# Patient Record
Sex: Male | Born: 1960 | Race: Black or African American | Hispanic: No | Smoking: Former smoker
Health system: Southern US, Community
[De-identification: ages and names within clinical notes are randomized; demographics above are authoritative.]

## PROBLEM LIST (undated history)

## (undated) DIAGNOSIS — I10 Essential (primary) hypertension: Secondary | ICD-10-CM

## (undated) DIAGNOSIS — I82409 Acute embolism and thrombosis of unspecified deep veins of unspecified lower extremity: Secondary | ICD-10-CM

## (undated) DIAGNOSIS — N400 Enlarged prostate without lower urinary tract symptoms: Secondary | ICD-10-CM

## (undated) DIAGNOSIS — E785 Hyperlipidemia, unspecified: Secondary | ICD-10-CM

## (undated) DIAGNOSIS — I2699 Other pulmonary embolism without acute cor pulmonale: Secondary | ICD-10-CM

## (undated) DIAGNOSIS — Z9119 Patient's noncompliance with other medical treatment and regimen: Secondary | ICD-10-CM

## (undated) DIAGNOSIS — S43006A Unspecified dislocation of unspecified shoulder joint, initial encounter: Secondary | ICD-10-CM

## (undated) DIAGNOSIS — K4090 Unilateral inguinal hernia, without obstruction or gangrene, not specified as recurrent: Secondary | ICD-10-CM

## (undated) DIAGNOSIS — E119 Type 2 diabetes mellitus without complications: Secondary | ICD-10-CM

## (undated) HISTORY — PX: OTHER SURGICAL HISTORY: SHX169

## (undated) HISTORY — DX: Essential (primary) hypertension: I10

## (undated) HISTORY — DX: Unspecified dislocation of unspecified shoulder joint, initial encounter: S43.006A

## (undated) HISTORY — DX: Hyperlipidemia, unspecified: E78.5

## (undated) HISTORY — DX: Patient's noncompliance with other medical treatment and regimen: Z91.19

## (undated) HISTORY — DX: Acute embolism and thrombosis of unspecified deep veins of unspecified lower extremity: I82.409

## (undated) HISTORY — DX: Type 2 diabetes mellitus without complications: E11.9

---

## 2001-05-03 ENCOUNTER — Encounter: Payer: Self-pay | Admitting: Emergency Medicine

## 2001-05-03 ENCOUNTER — Emergency Department (HOSPITAL_COMMUNITY): Admission: EM | Admit: 2001-05-03 | Discharge: 2001-05-04 | Payer: Self-pay | Admitting: Emergency Medicine

## 2002-01-04 ENCOUNTER — Emergency Department (HOSPITAL_COMMUNITY): Admission: EM | Admit: 2002-01-04 | Discharge: 2002-01-04 | Payer: Self-pay | Admitting: Emergency Medicine

## 2003-12-11 ENCOUNTER — Inpatient Hospital Stay (HOSPITAL_COMMUNITY): Admission: EM | Admit: 2003-12-11 | Discharge: 2003-12-17 | Payer: Self-pay | Admitting: Emergency Medicine

## 2004-01-07 ENCOUNTER — Ambulatory Visit (HOSPITAL_COMMUNITY): Admission: RE | Admit: 2004-01-07 | Discharge: 2004-01-07 | Payer: Self-pay | Admitting: Family Medicine

## 2004-02-05 ENCOUNTER — Emergency Department (HOSPITAL_COMMUNITY): Admission: EM | Admit: 2004-02-05 | Discharge: 2004-02-05 | Payer: Self-pay | Admitting: Emergency Medicine

## 2004-02-27 ENCOUNTER — Emergency Department (HOSPITAL_COMMUNITY): Admission: EM | Admit: 2004-02-27 | Discharge: 2004-02-27 | Payer: Self-pay | Admitting: Emergency Medicine

## 2004-07-05 ENCOUNTER — Emergency Department (HOSPITAL_COMMUNITY): Admission: EM | Admit: 2004-07-05 | Discharge: 2004-07-06 | Payer: Self-pay | Admitting: Emergency Medicine

## 2004-07-06 ENCOUNTER — Inpatient Hospital Stay (HOSPITAL_COMMUNITY): Admission: AD | Admit: 2004-07-06 | Discharge: 2004-07-11 | Payer: Self-pay | Admitting: Surgery

## 2004-07-13 ENCOUNTER — Encounter: Admission: RE | Admit: 2004-07-13 | Discharge: 2004-07-13 | Payer: Self-pay | Admitting: Oncology

## 2004-07-13 ENCOUNTER — Encounter (HOSPITAL_COMMUNITY): Admission: RE | Admit: 2004-07-13 | Discharge: 2004-08-12 | Payer: Self-pay | Admitting: Oncology

## 2004-07-24 ENCOUNTER — Ambulatory Visit (HOSPITAL_COMMUNITY): Payer: Self-pay | Admitting: Oncology

## 2004-08-25 ENCOUNTER — Encounter: Admission: RE | Admit: 2004-08-25 | Discharge: 2004-08-25 | Payer: Self-pay | Admitting: Oncology

## 2004-08-25 ENCOUNTER — Encounter (HOSPITAL_COMMUNITY): Admission: RE | Admit: 2004-08-25 | Discharge: 2004-09-24 | Payer: Self-pay | Admitting: Oncology

## 2004-09-18 ENCOUNTER — Ambulatory Visit (HOSPITAL_COMMUNITY): Payer: Self-pay | Admitting: Oncology

## 2004-10-16 ENCOUNTER — Encounter: Admission: RE | Admit: 2004-10-16 | Discharge: 2004-10-16 | Payer: Self-pay | Admitting: Oncology

## 2004-10-16 ENCOUNTER — Encounter (HOSPITAL_COMMUNITY): Admission: RE | Admit: 2004-10-16 | Discharge: 2004-11-15 | Payer: Self-pay | Admitting: Oncology

## 2004-11-05 ENCOUNTER — Ambulatory Visit (HOSPITAL_COMMUNITY): Payer: Self-pay | Admitting: Oncology

## 2004-12-05 ENCOUNTER — Emergency Department (HOSPITAL_COMMUNITY): Admission: RE | Admit: 2004-12-05 | Discharge: 2004-12-05 | Payer: Self-pay | Admitting: Family Medicine

## 2005-01-04 ENCOUNTER — Encounter (HOSPITAL_COMMUNITY): Admission: RE | Admit: 2005-01-04 | Discharge: 2005-02-03 | Payer: Self-pay | Admitting: Oncology

## 2005-01-04 ENCOUNTER — Ambulatory Visit (HOSPITAL_COMMUNITY): Payer: Self-pay | Admitting: Oncology

## 2005-01-04 ENCOUNTER — Encounter: Admission: RE | Admit: 2005-01-04 | Discharge: 2005-01-04 | Payer: Self-pay | Admitting: Oncology

## 2005-02-15 ENCOUNTER — Encounter (HOSPITAL_COMMUNITY): Admission: RE | Admit: 2005-02-15 | Discharge: 2005-03-17 | Payer: Self-pay | Admitting: Oncology

## 2005-02-15 ENCOUNTER — Encounter: Admission: RE | Admit: 2005-02-15 | Discharge: 2005-02-15 | Payer: Self-pay | Admitting: Oncology

## 2005-04-02 ENCOUNTER — Encounter (HOSPITAL_COMMUNITY): Admission: RE | Admit: 2005-04-02 | Discharge: 2005-05-02 | Payer: Self-pay | Admitting: Oncology

## 2005-04-02 ENCOUNTER — Encounter: Admission: RE | Admit: 2005-04-02 | Discharge: 2005-04-02 | Payer: Self-pay | Admitting: Oncology

## 2005-04-02 ENCOUNTER — Ambulatory Visit (HOSPITAL_COMMUNITY): Payer: Self-pay | Admitting: Oncology

## 2005-05-03 ENCOUNTER — Encounter (HOSPITAL_COMMUNITY): Admission: RE | Admit: 2005-05-03 | Discharge: 2005-06-02 | Payer: Self-pay | Admitting: Oncology

## 2005-05-03 ENCOUNTER — Encounter: Admission: RE | Admit: 2005-05-03 | Discharge: 2005-05-03 | Payer: Self-pay | Admitting: Oncology

## 2005-05-31 ENCOUNTER — Ambulatory Visit (HOSPITAL_COMMUNITY): Payer: Self-pay | Admitting: Oncology

## 2005-06-14 ENCOUNTER — Encounter (HOSPITAL_COMMUNITY): Admission: RE | Admit: 2005-06-14 | Discharge: 2005-07-14 | Payer: Self-pay | Admitting: Oncology

## 2005-06-14 ENCOUNTER — Encounter: Admission: RE | Admit: 2005-06-14 | Discharge: 2005-06-14 | Payer: Self-pay | Admitting: Oncology

## 2005-09-09 ENCOUNTER — Emergency Department (HOSPITAL_COMMUNITY): Admission: EM | Admit: 2005-09-09 | Discharge: 2005-09-09 | Payer: Self-pay | Admitting: Emergency Medicine

## 2006-07-11 ENCOUNTER — Observation Stay (HOSPITAL_COMMUNITY): Admission: EM | Admit: 2006-07-11 | Discharge: 2006-07-13 | Payer: Self-pay | Admitting: Emergency Medicine

## 2006-07-12 ENCOUNTER — Ambulatory Visit: Payer: Self-pay | Admitting: Oncology

## 2006-07-13 ENCOUNTER — Encounter (HOSPITAL_COMMUNITY): Admission: RE | Admit: 2006-07-13 | Discharge: 2006-08-12 | Payer: Self-pay | Admitting: Oncology

## 2006-07-13 ENCOUNTER — Encounter: Admission: RE | Admit: 2006-07-13 | Discharge: 2006-07-13 | Payer: Self-pay | Admitting: Oncology

## 2006-07-18 ENCOUNTER — Ambulatory Visit (HOSPITAL_COMMUNITY): Payer: Self-pay | Admitting: Oncology

## 2006-08-16 ENCOUNTER — Encounter (HOSPITAL_COMMUNITY): Admission: RE | Admit: 2006-08-16 | Discharge: 2006-09-12 | Payer: Self-pay | Admitting: Oncology

## 2006-09-20 ENCOUNTER — Encounter (HOSPITAL_COMMUNITY): Admission: RE | Admit: 2006-09-20 | Discharge: 2006-10-20 | Payer: Self-pay | Admitting: Oncology

## 2006-09-20 ENCOUNTER — Ambulatory Visit (HOSPITAL_COMMUNITY): Payer: Self-pay | Admitting: Oncology

## 2007-01-16 ENCOUNTER — Encounter (HOSPITAL_COMMUNITY): Admission: RE | Admit: 2007-01-16 | Discharge: 2007-02-15 | Payer: Self-pay | Admitting: Oncology

## 2007-01-16 ENCOUNTER — Ambulatory Visit (HOSPITAL_COMMUNITY): Payer: Self-pay | Admitting: Oncology

## 2007-03-14 ENCOUNTER — Encounter (HOSPITAL_COMMUNITY): Admission: RE | Admit: 2007-03-14 | Discharge: 2007-04-13 | Payer: Self-pay | Admitting: Oncology

## 2007-04-14 ENCOUNTER — Encounter: Admission: RE | Admit: 2007-04-14 | Discharge: 2007-05-14 | Payer: Self-pay | Admitting: Oncology

## 2007-04-28 ENCOUNTER — Ambulatory Visit (HOSPITAL_COMMUNITY): Payer: Self-pay | Admitting: Oncology

## 2007-05-17 ENCOUNTER — Encounter (HOSPITAL_COMMUNITY): Admission: RE | Admit: 2007-05-17 | Discharge: 2007-06-13 | Payer: Self-pay | Admitting: Oncology

## 2007-06-23 ENCOUNTER — Ambulatory Visit (HOSPITAL_COMMUNITY): Payer: Self-pay | Admitting: Oncology

## 2007-06-23 ENCOUNTER — Encounter (HOSPITAL_COMMUNITY): Admission: RE | Admit: 2007-06-23 | Discharge: 2007-07-23 | Payer: Self-pay | Admitting: Oncology

## 2007-08-12 ENCOUNTER — Emergency Department (HOSPITAL_COMMUNITY): Admission: EM | Admit: 2007-08-12 | Discharge: 2007-08-12 | Payer: Self-pay | Admitting: Emergency Medicine

## 2007-08-14 ENCOUNTER — Ambulatory Visit (HOSPITAL_COMMUNITY): Payer: Self-pay | Admitting: Oncology

## 2007-08-14 ENCOUNTER — Ambulatory Visit (HOSPITAL_COMMUNITY): Admission: RE | Admit: 2007-08-14 | Discharge: 2007-08-14 | Payer: Self-pay | Admitting: Family Medicine

## 2007-08-14 ENCOUNTER — Encounter (HOSPITAL_COMMUNITY): Admission: RE | Admit: 2007-08-14 | Discharge: 2007-09-13 | Payer: Self-pay | Admitting: Oncology

## 2007-09-18 ENCOUNTER — Encounter (HOSPITAL_COMMUNITY): Admission: RE | Admit: 2007-09-18 | Discharge: 2007-10-18 | Payer: Self-pay | Admitting: Oncology

## 2007-10-03 ENCOUNTER — Ambulatory Visit (HOSPITAL_COMMUNITY): Payer: Self-pay | Admitting: Oncology

## 2007-10-31 ENCOUNTER — Encounter (HOSPITAL_COMMUNITY): Admission: RE | Admit: 2007-10-31 | Discharge: 2007-11-30 | Payer: Self-pay | Admitting: Oncology

## 2007-11-28 ENCOUNTER — Ambulatory Visit (HOSPITAL_COMMUNITY): Payer: Self-pay | Admitting: Oncology

## 2007-12-05 ENCOUNTER — Encounter (HOSPITAL_COMMUNITY): Admission: RE | Admit: 2007-12-05 | Discharge: 2008-01-04 | Payer: Self-pay | Admitting: Oncology

## 2008-01-10 ENCOUNTER — Encounter (HOSPITAL_COMMUNITY): Admission: RE | Admit: 2008-01-10 | Discharge: 2008-02-09 | Payer: Self-pay | Admitting: Oncology

## 2008-01-24 ENCOUNTER — Ambulatory Visit (HOSPITAL_COMMUNITY): Payer: Self-pay | Admitting: Oncology

## 2008-02-21 ENCOUNTER — Encounter (HOSPITAL_COMMUNITY): Admission: RE | Admit: 2008-02-21 | Discharge: 2008-03-22 | Payer: Self-pay | Admitting: Oncology

## 2008-03-13 ENCOUNTER — Emergency Department (HOSPITAL_COMMUNITY): Admission: EM | Admit: 2008-03-13 | Discharge: 2008-03-13 | Payer: Self-pay | Admitting: Emergency Medicine

## 2008-04-03 ENCOUNTER — Encounter (HOSPITAL_COMMUNITY): Admission: RE | Admit: 2008-04-03 | Discharge: 2008-05-03 | Payer: Self-pay | Admitting: Oncology

## 2008-04-03 ENCOUNTER — Ambulatory Visit (HOSPITAL_COMMUNITY): Payer: Self-pay | Admitting: Oncology

## 2008-05-09 ENCOUNTER — Encounter (HOSPITAL_COMMUNITY): Admission: RE | Admit: 2008-05-09 | Discharge: 2008-06-08 | Payer: Self-pay | Admitting: Oncology

## 2009-02-15 ENCOUNTER — Emergency Department (HOSPITAL_COMMUNITY): Admission: EM | Admit: 2009-02-15 | Discharge: 2009-02-15 | Payer: Self-pay | Admitting: Emergency Medicine

## 2009-04-15 ENCOUNTER — Ambulatory Visit (HOSPITAL_COMMUNITY): Payer: Self-pay | Admitting: Oncology

## 2009-04-15 ENCOUNTER — Encounter (HOSPITAL_COMMUNITY): Admission: RE | Admit: 2009-04-15 | Discharge: 2009-05-15 | Payer: Self-pay | Admitting: Oncology

## 2009-07-08 ENCOUNTER — Ambulatory Visit (HOSPITAL_COMMUNITY): Payer: Self-pay | Admitting: Oncology

## 2009-07-08 ENCOUNTER — Encounter (HOSPITAL_COMMUNITY): Admission: RE | Admit: 2009-07-08 | Discharge: 2009-08-07 | Payer: Self-pay | Admitting: Oncology

## 2009-08-22 ENCOUNTER — Ambulatory Visit (HOSPITAL_COMMUNITY): Payer: Self-pay | Admitting: Oncology

## 2009-08-22 ENCOUNTER — Encounter (HOSPITAL_COMMUNITY): Admission: RE | Admit: 2009-08-22 | Discharge: 2009-09-10 | Payer: Self-pay | Admitting: Oncology

## 2009-09-14 IMAGING — CR DG ABDOMEN 1V
2 series · 2 of 2 positions shown · non-contrast
Comparison: CT abdomen and pelvis 07/12/06.

CLINICAL DATA: Gross hematuria. 
 ABDOMEN - 1 VIEW:

[view not recorded (1 of 2)]
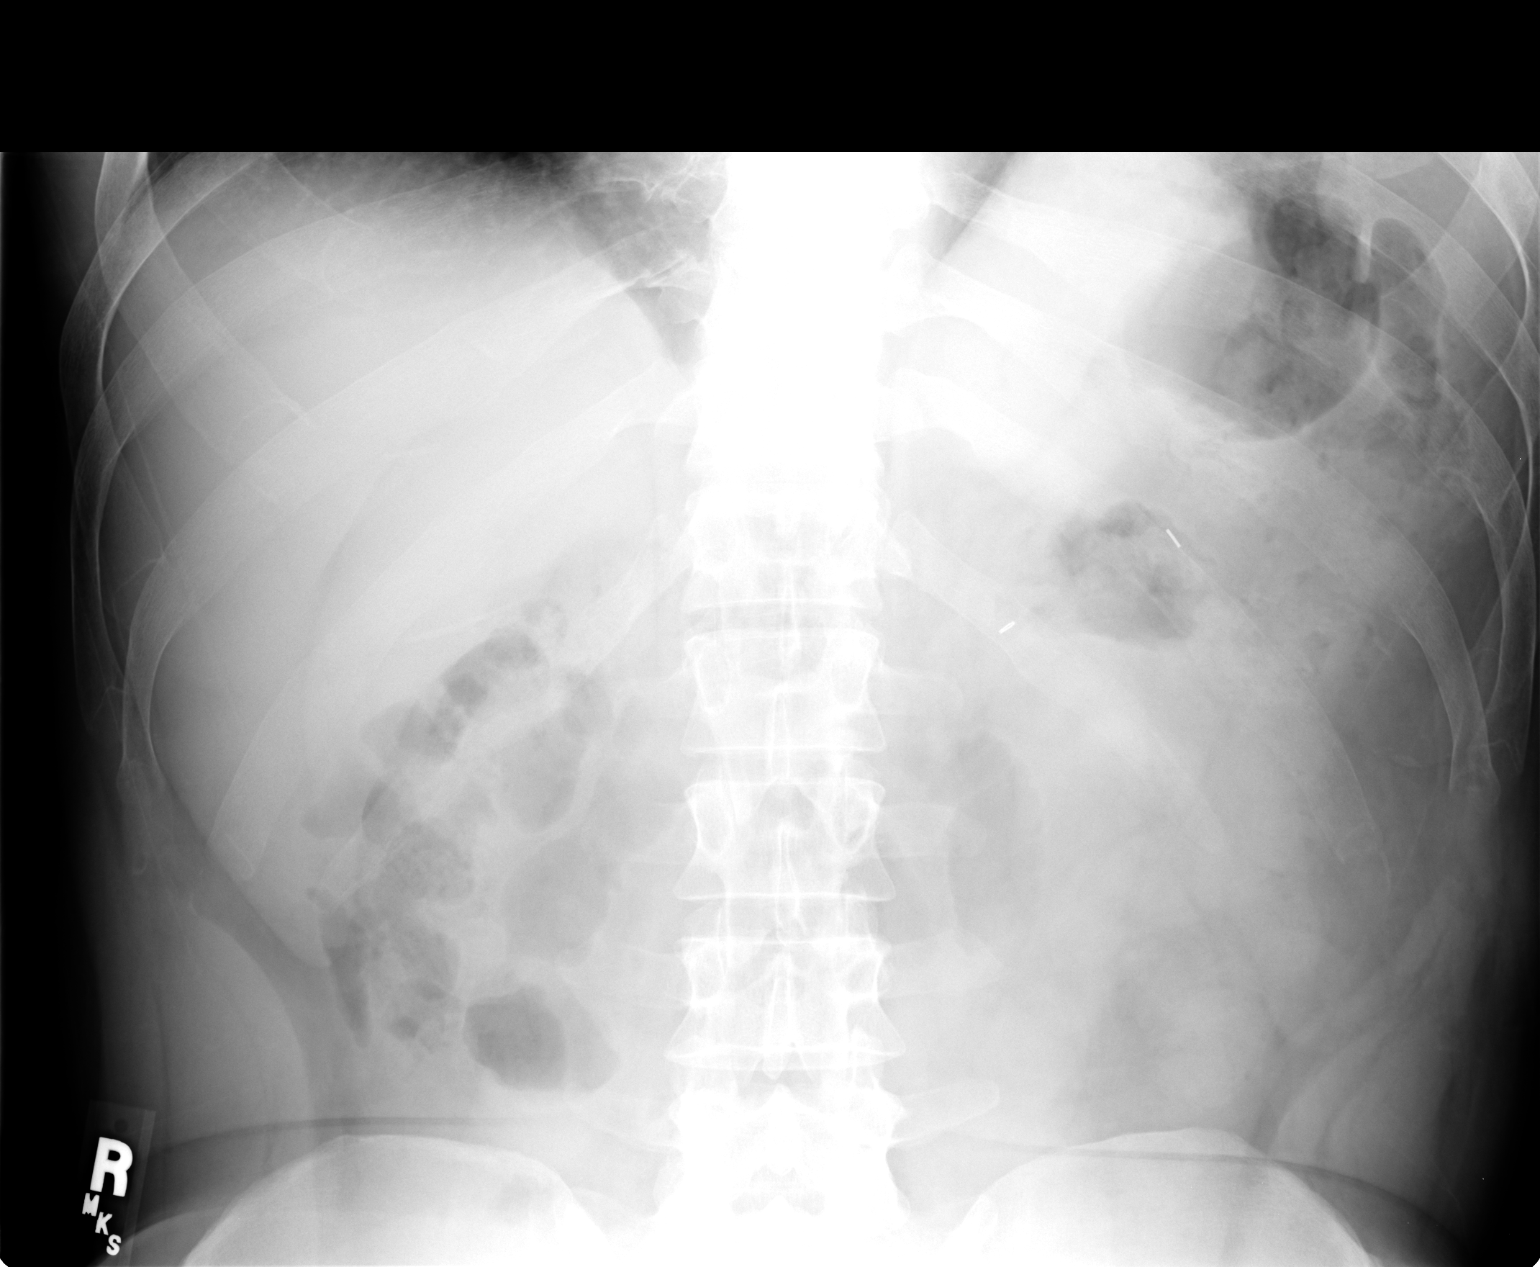

[view not recorded (2 of 2)]
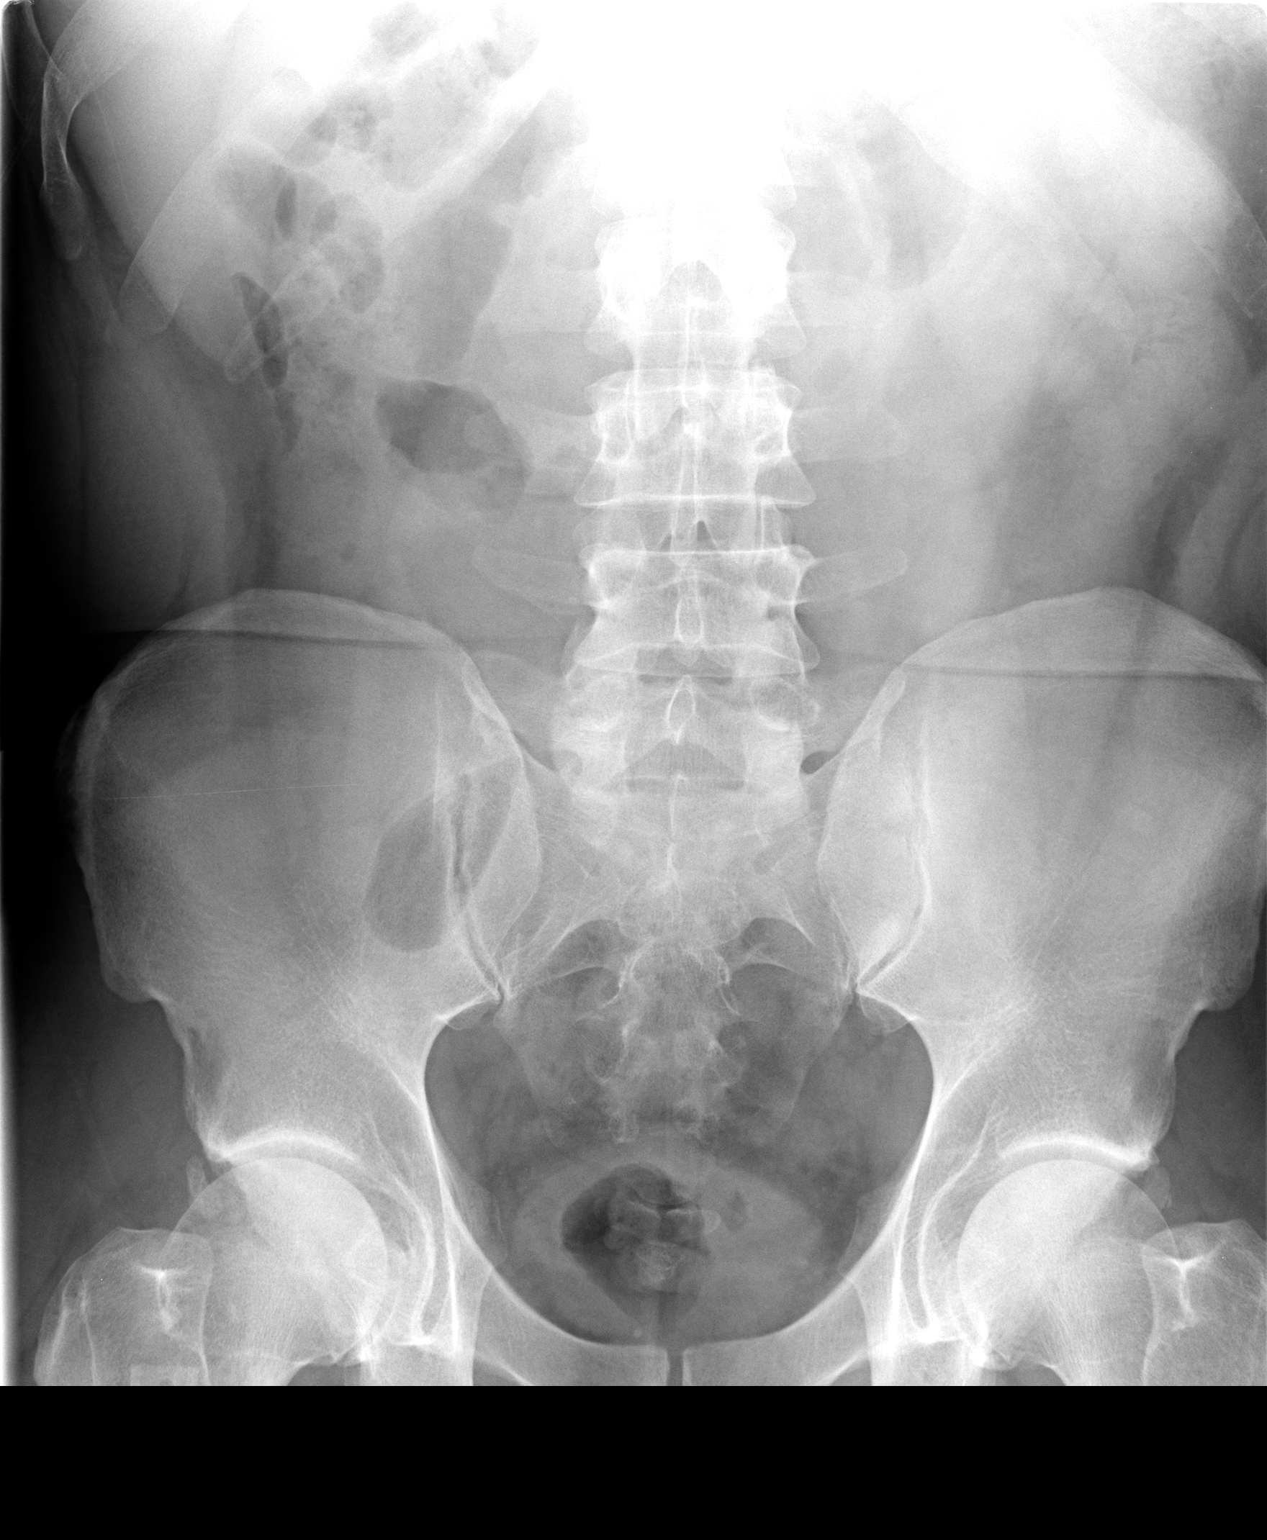

[2 of 2 positions shown; findings below may reference images not displayed]

FINDINGS: There are no radiopaque calculi projecting over the right renal outline or expected course of the right ureter. The left kidney is absent.
IMPRESSION: No radiopaque calculi.

## 2009-09-19 ENCOUNTER — Encounter (HOSPITAL_COMMUNITY): Admission: RE | Admit: 2009-09-19 | Discharge: 2009-10-19 | Payer: Self-pay | Admitting: Oncology

## 2009-10-13 ENCOUNTER — Ambulatory Visit (HOSPITAL_COMMUNITY): Payer: Self-pay | Admitting: Oncology

## 2009-10-24 ENCOUNTER — Encounter (HOSPITAL_COMMUNITY): Admission: RE | Admit: 2009-10-24 | Discharge: 2009-11-23 | Payer: Self-pay | Admitting: Oncology

## 2009-11-17 ENCOUNTER — Emergency Department (HOSPITAL_COMMUNITY): Admission: EM | Admit: 2009-11-17 | Discharge: 2009-11-17 | Payer: Self-pay | Admitting: Emergency Medicine

## 2010-08-13 ENCOUNTER — Encounter (HOSPITAL_COMMUNITY)
Admission: RE | Admit: 2010-08-13 | Discharge: 2010-09-12 | Payer: Self-pay | Source: Home / Self Care | Attending: Oncology | Admitting: Oncology

## 2010-08-13 ENCOUNTER — Ambulatory Visit (HOSPITAL_COMMUNITY): Payer: Self-pay | Admitting: Oncology

## 2010-09-10 ENCOUNTER — Ambulatory Visit (HOSPITAL_COMMUNITY): Payer: Self-pay | Admitting: Oncology

## 2010-09-21 ENCOUNTER — Encounter (HOSPITAL_COMMUNITY)
Admission: RE | Admit: 2010-09-21 | Discharge: 2010-10-13 | Payer: Self-pay | Source: Home / Self Care | Attending: Oncology | Admitting: Oncology

## 2010-09-28 LAB — PROTIME-INR
INR: 1.64 — ABNORMAL HIGH (ref 0.00–1.49)
Prothrombin Time: 19.6 seconds — ABNORMAL HIGH (ref 11.6–15.2)

## 2010-10-04 ENCOUNTER — Encounter: Payer: Self-pay | Admitting: Family Medicine

## 2010-10-08 LAB — PROTIME-INR: INR: 2.83 — ABNORMAL HIGH (ref 0.00–1.49)

## 2010-10-22 ENCOUNTER — Other Ambulatory Visit (HOSPITAL_COMMUNITY): Payer: BC Managed Care – PPO

## 2010-10-23 ENCOUNTER — Other Ambulatory Visit (HOSPITAL_COMMUNITY): Payer: BC Managed Care – PPO

## 2010-10-23 ENCOUNTER — Encounter (HOSPITAL_COMMUNITY): Payer: BC Managed Care – PPO | Attending: Oncology

## 2010-10-23 DIAGNOSIS — Z7901 Long term (current) use of anticoagulants: Secondary | ICD-10-CM | POA: Insufficient documentation

## 2010-10-23 DIAGNOSIS — Z86718 Personal history of other venous thrombosis and embolism: Secondary | ICD-10-CM

## 2010-11-06 ENCOUNTER — Other Ambulatory Visit (HOSPITAL_COMMUNITY): Payer: BC Managed Care – PPO

## 2010-11-06 ENCOUNTER — Encounter (HOSPITAL_COMMUNITY): Payer: BC Managed Care – PPO

## 2010-11-23 LAB — PROTIME-INR
INR: 2.89 — ABNORMAL HIGH (ref 0.00–1.49)
Prothrombin Time: 29.3 seconds — ABNORMAL HIGH (ref 11.6–15.2)
Prothrombin Time: 30.3 seconds — ABNORMAL HIGH (ref 11.6–15.2)

## 2010-11-29 LAB — PROTIME-INR
INR: 1.92 — ABNORMAL HIGH (ref 0.00–1.49)
Prothrombin Time: 21.8 seconds — ABNORMAL HIGH (ref 11.6–15.2)

## 2010-12-02 LAB — PROTIME-INR
INR: 1.67 — ABNORMAL HIGH (ref 0.00–1.49)
Prothrombin Time: 19.6 seconds — ABNORMAL HIGH (ref 11.6–15.2)

## 2010-12-06 LAB — PROTIME-INR: INR: 4.47 — ABNORMAL HIGH (ref 0.00–1.49)

## 2010-12-06 LAB — APTT: aPTT: 47 seconds — ABNORMAL HIGH (ref 24–37)

## 2010-12-15 LAB — PROTIME-INR: Prothrombin Time: 19.3 seconds — ABNORMAL HIGH (ref 11.6–15.2)

## 2010-12-16 LAB — PROTIME-INR
INR: 1.6 — ABNORMAL HIGH (ref 0.00–1.49)
Prothrombin Time: 19.3 seconds — ABNORMAL HIGH (ref 11.6–15.2)

## 2010-12-29 ENCOUNTER — Encounter (HOSPITAL_COMMUNITY): Payer: BC Managed Care – PPO | Attending: Oncology | Admitting: Oncology

## 2011-01-27 ENCOUNTER — Emergency Department (HOSPITAL_COMMUNITY)
Admission: EM | Admit: 2011-01-27 | Discharge: 2011-01-27 | Disposition: A | Discharge status: Home / Self Care | Payer: BC Managed Care – PPO | Attending: Emergency Medicine | Admitting: Emergency Medicine

## 2011-01-27 DIAGNOSIS — K649 Unspecified hemorrhoids: Secondary | ICD-10-CM | POA: Insufficient documentation

## 2011-01-27 DIAGNOSIS — Z86711 Personal history of pulmonary embolism: Secondary | ICD-10-CM | POA: Insufficient documentation

## 2011-01-27 DIAGNOSIS — Z7901 Long term (current) use of anticoagulants: Secondary | ICD-10-CM | POA: Insufficient documentation

## 2011-01-27 DIAGNOSIS — Z86718 Personal history of other venous thrombosis and embolism: Secondary | ICD-10-CM | POA: Insufficient documentation

## 2011-01-29 NOTE — Consult Note (Signed)
NAME:  Gregory Andrews, Gregory Andrews NO.:  [id number]   MEDICAL RECORD NO.:  [id number]          PATIENT TYPE:  INP   LOCATION:  A219                          FACILITY:  [facility]   PHYSICIAN:  Ladona Horns. Neijstrom, MD  DATE OF BIRTH:  1961-04-29   DATE OF CONSULTATION:  DATE OF DISCHARGE:                                   CONSULTATION   DIAGNOSES:  1.  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  2.  Longstanding smoking history since age 62 of a 1/2 pack of cigarettes a      day.  3.  History of arthroscopic surgery on the right knee.   HISTORY:  This is a very-pleasant 50 year old African American gentleman who  was admitted on July 06, 2004, with chest discomfort, right leg  discomfort, especially in the thigh, and some shortness of breath.  He was  actually found to have an abnormal nuclear medicine scan of his lungs  consistent with a pulmonary embolus, high probability.   This gentleman presented actually in March of this year with bilateral large  pulmonary emboli as documented by CT angiogram at [facility] when  he presented to the hospital with swelling and some chest discomfort at that  time as well.   He was admitted and treated with anticoagulation, sent home on Coumadin, and  he was being followed by Dr. Mills Koller.  The patient states that he was  getting PT's on a weekly basis until about one month ago when he missed a  couple of appointments.  He has still been on the same dose of Coumadin with  7.5 mg on Monday evenings, 5 mg on Tuesday, 7.5 mg on Wednesday's, 5 mg on  Thursdays, and then 10 mg on Fridays and Saturdays.   He had a followup CT scan of the chest in May because he had some faint  inkling of discomfort in the chest.  He went back to the hospital, and had a  redo of a CT scan which showed marked resolution of the pulmonary emboli  that had been seen earlier in March.   He has no family  history of blood clotting abnormalities that he is aware  of.  He has never had a blood clot before.  He has not had a change in his  lifestyle.  He has not had surgery.  He has not been immobile. He has not  been on long car trips, etc.   On his job with [employer], He is a Research officer, trade union, and has been doing  that for quite some time in various capacities, and for several different  companies.  However, he is usually quite active.  He is on his feet a lot.  He move around quite a bit he states.   FAMILY HISTORY:  Otherwise noncontributory.  He is the youngest of four  children.  His siblings above him are in good health to the best of his  knowledge.   REVIEW OF SYSTEMS:  Otherwise, he has not lost weight in the last  year.  He  has a good appetite.  He has a good energy level.  He is no longer short of  breath today.  His right thigh still bothers him with discomfort.  He has  noticed that the swelling in his ankle has diminished.  He does not have  headaches.  He does not have change in bowel habits.  No urinating problems.  His review of systems otherwise is noncontributory.   SOCIAL HISTORY:  He and his wife have been married for several years.  They  have a four-year-old child who is in good health. He does not use alcohol.   PHYSICAL EXAMINATION:  VITAL SIGNS:  He is afebrile.  Blood pressure is  normal.  Respirations are 16 and unlabored.  Pulse 80 and regular.  SKIN:  Temperature is warm and dry to the touch.  He does have slight  tenderness to his right thigh, but there is no edema, and there is no  increased heat over his calves.  They are symmetrical.  PULSES:  The pulses are 1-2+ and symmetrical.  GENERAL:  He is alert and oriented.  HEENT:  Pupils equal, round, reactive to light.  Tongue is normal in the  midline.  NECK:  Supple.  He has no thyromegaly.  LUNGS:  Clear at this time to auscultation and percussion.  HEART:  Regular rhythm and rate without No murmurs,  rubs, or gallops.  ABDOMEN:  Soft, nontender without hepatosplenomegaly.  There are no masses.   LABORATORY DATA:  He looks very good, but I think the key with him is that  he needs to be clearly therapeutically controlled with his Coumadin in a  range of 2.0 to 3.5.  When he was admitted to this hospital on this  occasion, his INR was 1.1 which is normal, and not therapeutic whatsoever.   The remainder of his blood work was really unremarkable.   He needs at some point to be checked for the hypercoagulability familial  factors, but now is not the time in my opinion.   Certainly, while he is on maintenance Coumadin, his protein C and protein S  levels will be interfered with.  Therefore, we can at least check all the  other factors.  Plan one month to six weeks.  I think the best thing for [place]  to do to him is continue the Lovenox at this dosage.  If he needs to come to  the office to have this arranged, we will just given him 150 mg once a day  going forward.  He probably needs to recover from these episodes for another  one to two weeks out of work, but then I think he will be able to return to  work.   I think the question of lifelong Coumadin certainly comes up, but he  certainly was not therapeutic when this occurred, and it would be nice to  get the old PT values from Dr. Tanda Rockers' office to see what they have shown  in the past several months to see how well he was controlled prior to his  losing contact with Dr. Tanda Rockers.      ESN/MEDQ  D:  07/10/2004  T:  07/10/2004  Job:  [id number]   cc:   Annia Friendly. Loleta Chance, M.D.  [address]  [place]  [place] [address]  Fax: [phone]

## 2011-01-29 NOTE — H&P (Signed)
NAME:  Gregory, Andrews NO.:  [id number]   MEDICAL RECORD NO.:  [id number]          PATIENT TYPE:  INP   LOCATION:  A219                          FACILITY:  [facility]   PHYSICIAN:  Annia Friendly. Loleta Chance, M.D.   DATE OF BIRTH:  14-Feb-1961   DATE OF ADMISSION:  07/06/2004  DATE OF DISCHARGE:  LH                                HISTORY & PHYSICAL   IDENTIFYING DATA:  The patient is a 50 year old, married, black male  employee at [employer] from [place], [place].  The patient  is admitted for __________ right leg pain for four days after being seen in  the emergency room July 05, 2004 at [facility]  [place].  The pain of the right leg is described as a pulling  discomfort above the knee and extended the discomfort to below the right  knee.  Pain is worse secondary to standing.  History is also positive for  swelling of right leg, and shortness of breath on July 05, 2004 while  using tools at home.  He experienced progressive right leg pain while at  work on July 05, 2004 before going to the emergency room.  Labs were  drawn by the emergency room physician.  A CT scan was postponed by the ER at  [facility]  due to the elevated creatinine according to the  patient.   History is significant for abnormal left kidney diagnosed at age 89 after  presentation with a gross hematuria.  The patient denies chest pain,  syncope, palpitations, etc.  Medical history is positive for chronic  Coumadin therapy secondary to pulmonary embolus in March 2005.  During that  hospitalization, ultrasound of the right leg according to the patient  demonstrated deep venous thrombosis.  Medical history is negative for  hypertension, diabetes, tuberculosis, cancer, sickle cell, asthma, seizure  disorder.   MEDICATIONS:  Prescribed medications include:  1.  Lovenox 100 mg subcutaneously q.12h. prescribed by the ER at [facility]  on July 05, 2004.  2.  Coumadin 7.5 mg p.o. every day a.c. at 1800.   Over-the-counter medications are:  1.  Nuprin one to two tablets p.o. every day p.r.n. for pain.  2.  Alka Seltzer Plus for cold symptoms.   ALLERGIES:  The patient is allergic to CODEINE (dizziness and shortness of  breath).   HABITS:  Positive for cigarette smoking (five per day, started at age 26).  Ethanol use (two beers per weekend).  Habits negative for street drugs.  Sexually transmitted discharge history is negative for gonorrhea, syphilis,  herpes, and HIV infection.   PAST MEDICAL HISTORY:  Hospitalization at [facility] in March 2005  for pulmonary embolus and at age 64 [facility] in 1985 for gross  hematuria.   FAMILY HISTORY:  Mother deceased at age 14 secondary to complications of  breast cancer.  Father is living at age 49 in the hospital history of  hypertension.  One sister living at age 31 with history of hypertension and  diabetes.  Two brothers living  at age 68 in good health, age 46 in good  health.  One daughter living at age 84 in good health.   REVIEW OF SYSTEMS:  Negative for ecchymotic lesions, headaches, epistaxis,  bleeding gums, dysphagia, hemoptysis, dizziness, hematemesis, nausea,  vomiting, dysuria, gross hematuria, melena, diarrhea, night sweats, weight  loss, orthopnea, and wheezing.   PHYSICAL EXAMINATION:  GENERAL:  General appearance reveals middle-aged,  medium height, medium frame, alert, black male in no apparent respiratory  distress.  HEENT:  Grossly within normal limits.  LUNGS:  Clear.  HEART:  Audible S1 and S2 without murmur.  Regular rate and rhythm.  CHEST WALL:  No lesions, nontender to palpation.  ABDOMEN:  Obese, soft, nontender in all four quadrants.  No palpable masses  or organomegaly.  GENITALIA:  Penis circumcised.  No penile lesions or discharge.  Scrotum  shows palpable testicles without nodule or tenderness.  RECTAL:  Deferred.   EXTREMITIES:  Lower extremities show no discoloration, no significant  swelling.  Positive for tenderness along the medial aspect of the right  thigh.  No joint swelling.  No joint redness, no joint hotness.  No tibial  edema.  Palpable dorsalis pedis bilaterally.  NEUROLOGIC: Alert and oriented to person, place and time.  Cranial nerves II-  XII appeared intact.   LABORATORY DATA:  ABG on room air showed pH 7.4, PCO2 39, PO2 67.9, O2  saturation 93%.  White count 6.4, hemoglobin 15.5, hematocrit 46.5,  platelets 203,000.  Prothrombin time 13.6, INR 1.1, partial thromboplastin  time 43.  Sodium 132, potassium 4, chloride 108, CO2 30, glucose 92, BUN 14,  creatinine 1.5.  Total CPK 250, CK-MB 4.6, troponin-I 0.02.  Urinalysis  showed specific gravity 1.020, pH 5.5, no glucose, no bilirubin, no ketones,  no blood, protein negative, nitrite negative.  Leukocyte negative.   IMPRESSION:  1.  Primary recurrent leg pain with history of deep venous thrombosis.  2.  Chronic ethanol use.   PLAN:  Continue Lovenox subcutaneously under supervision of pharmacist.  Coumadin 10 mg p.o. every day x3, and 7.5 mg p.o. every day.  Colace 100 mg  two tablets p.o. every day.  Pepcid 20 mg p.o. every bedtime.  VQ scan.  Regular diet.  Venous Doppler study.  Fasting lipid profile.  Dietician  consult.  Analgesic for pain.  Oxygen at 4 L per minute via nasal cannula.  Telemetry.  EKG.  Vascular surgery consult, Dr. Tanda Rockers.      GKH/MEDQ  D:  07/06/2004  T:  07/07/2004  Job:  [id number]

## 2011-01-29 NOTE — Discharge Summary (Signed)
NAME:  Gregory Andrews, Gregory Andrews NO.:  [id number]   MEDICAL RECORD NO.:  [id number]          PATIENT TYPE:  INP   LOCATION:  A219                          FACILITY:  [facility]   PHYSICIAN:  Annia Friendly. Loleta Chance, M.D.   DATE OF BIRTH:  1961/07/03   DATE OF ADMISSION:  07/06/2004  DATE OF DISCHARGE:  10/29/2005LH                                 DISCHARGE SUMMARY   HISTORY OF PRESENT ILLNESS:  The patient was a 50 year old married black  male, employee at [employer], [place], from [place], [place].  The patient was admitted for recurrent right leg pain for four  days after being seen in the emergency room on July 05, 2004, at [facility] in [place], [place].  The pain in the right leg was  described as a pulling discomfort above the knee, and extended to behind the  right knee and below it.  The pain was worse secondary to standing.  He also  complained of swelling of right leg and shortness of breath on July 05, 2004, while using tools at home.  He incurred progressive right leg pain  while at work on July 05, 2004, before going to the emergency room.   PAST MEDICAL HISTORY:  Positive for chronic Coumadin therapy secondary to  status post pulmonary embolus in March of 2005, and status post left  nephrectomy.  The patient is ALLERGIC TO CODEINE (shortness of breath).  Positive for cigarette smoking (five per day), started at age 9; ethanol  use (two beers per weekend).   Past medical history positive for hospitalization at [facility] in  March of 2005 for pulmonary embolus, and hospitalization at [facility] in [place] for gross hematuria.   FAMILY HISTORY:  Mother deceased at age 41 secondary to complications of  breast cancer.  Father is living at age 74 with a history of hypertension.  One sister is living at age 32 with a history of hypertension and diabetes.  Two brothers are living at age 49 in good health and age 92  in good health.  One daughter is living at age 35 in good health.   PROBLEMS:  Recurrent leg pain secondary to deep venous thrombosis and  pulmonary embolism.   PHYSICAL EXAMINATION:  GENERAL:  General appearance revealed a middle aged,  medium height, medium frame, alert, black male in no apparent respiratory  distress.  VITAL SIGNS:  Temperature 98.3, pulse 58, blood pressure 130/87,  respirations 20.  HEENT:  Normocephalic.  Ears, normal auricle.  LUNGS:  Clear.  HEART:  S1, S2 without murmur, regular rate and rhythm.  ABDOMEN:  Obese, soft, nontender in all four quadrants, no palpable masses  or organomegaly.  EXTREMITIES:  No discoloration.  Right thigh positive for tenderness along  the medial aspect.  No discoloration or hotness of right lower extremity.  Positive tenderness on palpation of popliteal fossa of the right calf,  positive for mild tenderness.  NEUROLOGIC:  Alert and oriented to person, place and time.  Cranial nerves  II-XII appeared intact.   LABORATORY DATA:  ABG's on room  air:  pH 7.4, pCO2 39, pO2 67.9, O2  saturation 93%. White count 6.4, hemoglobin 15.5, hematocrit 46.5.  Platelets 203,000.  Prothrombin time 13.6.  INR 1.1.  Partial thromboplastin  time 43.  Sodium 132, potassium 4.0, chloride 108, CO2 30.  Glucose 92, BUN  14, creatinine 1.5.  Calcium 9.2.  Total protein 6.8.  Albumin 3.5.  AST 16.  ALT 19.  ALP 72.  Total bilirubin 0.7.  Total CPK 250, CK-MB 4.6.  Troponin  I of 0.02.   HOSPITAL COURSE:  The patient was admitted to a medical floor telemetry.  Moreover, he was treated with oxygen at 4 liters per minute via nasal  cannula.  Coumadin 7.5 mg p.o. every day.  Pharmacy consultation for  management of Coumadin therapy and Lovenox, Colace two tablets p.o. every  bedtime, Pepcid 20 mg every day.  Diet was regular with dietitian consulted.  Other supportive measures.   The patient also had a VQ scan and avoidance of CT due to the presence of   right kidney.  Renal ultrasound on July 07, 2004, read as normal  ultrasound of right kidney and status post left nephrectomy.  Ultrasound of  right lower extremity read as deep venous thrombosis involving the  superficial vein in the distal thigh extending to the right popliteal vein  as read by Dr. Ulyses Southward.  VQ scan on July 07, 2004, is read as high  probability for pulmonary embolism by Dr. Dario Guardian.  Perfusion scan showed  multisegmental and subsegmental perfusion defects, more on the right lung,  which were unmatched with the ventilation study.  Therefore, the study was  high probability for pulmonary embolism.  The VQ scan was read by Dr.  Dario Guardian.  The patient also had a hematology consult by Dr. Ladona Horns. Neijstrom.  Dr. Mariel Sleet indicated in his note of July 09, 2004, that he would like  to see the patient as an outpatient, Monday, Wednesday or Friday.  He will  manage his Coumadin and do further blood tests pertaining to workup, due to  his reoccurrence in less than one year.  The patient did not incur any  nosebleed, bleeding gums, gross hematuria, hemoptysis, hematemesis, etc.,  during this hospitalization.  He did not complain of chest pain, dizziness  or feeling faint-like during this hospitalization also.  He was discharged  on July 11, 2004.  He was discharged to his home.  Problem #2.  Status post left nephrectomy.  Ultrasound demonstrated absence  of the left kidney.  An attempt was made during hospitalization not to add  any insult to present kidney.  Problem #3.  Chronic tobacco use.  The patient was advised to stop smoking.  He demonstrated some mild hypoxia on room air.  The patient also encouraged  to stop smoking because of high probability of lung cancer.  The patient is  alert and oriented to person, place and time at the time of discharge.   DISCHARGE ACTIVITY:  As tolerated.   MEDICATION: 1.  Coumadin 5 mg three tablets p.o. on July 11, 2004 and  July 12, 2004.  2.  The patient also was discharged on Lovenox 100 mg subcutaneously twice      daily until advised otherwise by Dr. Ladona Horns. Neijstrom.  He was to avoid      aspirin-like products.  3.  Colace 200 mg p.o. every bedtime.  4.  Tylenol 500 mg p.o. q.6-8h. p.r.n. for pain.  5.  Pepcid 20 mg p.o. every day.   DISCHARGE INSTRUCTIONS:  1.  Follow up with Dr. Ladona Horns. Neijstrom on July 13, 2004.  2.  Follow up with Dr. Loleta Chance in one week.   ADDENDUM:  Prothrombin time on July 11, 2004 was 15.0 and INR 1.3.   FINAL PRIMARY DIAGNOSES:  1.  Right leg pain secondary to deep vein thrombosis, probably superficial      femoral vein, distal thigh, extending to the right popliteal vein.  2.  Chronic tobacco use.  3.  Pulmonary embolus by VQ scan.   EKG demonstrated normal sinus rhythm, normal EKG is read by Dr. Ramon Dredge L.  Hawkins.      GKH/MEDQ  D:  07/13/2004  T:  07/13/2004  Job:  [id number]

## 2011-01-29 NOTE — Consult Note (Signed)
NAME:  Gregory Andrews, Gregory Andrews NO.:  [id number]   MEDICAL RECORD NO.:  [id number]          PATIENT TYPE:  OBV   LOCATION:  A203                          FACILITY:  [facility]   PHYSICIAN:  Ladona Horns. Neijstrom, MD  DATE OF BIRTH:  1961-03-28   DATE OF CONSULTATION:  07/12/2006  DATE OF DISCHARGE:                                   CONSULTATION   DIAGNOSES:  1. Acute recurrent pulmonary emboli.  2. History of deep vein thrombosis in the past.   HISTORY OF PRESENT ILLNESS:  This is a very pleasant 50 year old African  American gentleman who was first seen in May 2005 with pulmonary emboli that  were fairly large.   He had recurrence of these emboli in October 2005, though at that time, he  was not adequately anticoagulated, and it was felt that this was a failure  of adequate compliance rather the failure adequate anticoagulation.  He was  treated after that with a year of Coumadin with really good control and very  good follow-up and good compliance.   He started having chest pain, he states Sunday evening, which persisted into  Monday morning and Monday afternoon, and after picking his daughter up from  school, he came to the hospital where he was assessed with CT angiogram  which once again revealed several new areas of pulmonary emboli.   He was admitted and started on medication.  He was initially given Lovenox,  and he has also given 5 mg of Coumadin orally.   His pain is still present but it is slightly less.  He was also short winded  at times prior to this admission.  His pain level was 9/10.  He has not been  traveling.  He has not been on vacation.  He has not taken any long car  trips.  His prior workup for hypocoagulable state was totally negative in  2005 and 2006.   ALLERGIES:  CODEINE GIVES SHORTNESS OF BREATH.   SOCIAL HISTORY:  He is married.  He has a 55-year-old daughter.  He And his  wife are otherwise in good health as is his daughter.  He has two  brothers  and one sister, none of whom have had a clot and remain in good health.  His  parents have never had clots.   REVIEW OF SYSTEMS:  While here, he had lower extremity venous ultrasound of the right leg as of  July 11, 2006, which was negative for DVT.  He had awakened from his  sleep on Monday with mild swelling of the right ankle area.   The rest of his of review of systems is negative.   PHYSICAL EXAMINATION:  VITAL SIGNS:  He is afebrile at this time.  Skin is  warm and dry the touch.  Blood pressure 120/70, respirations 16, 18 and  unlabored, pulse right around 56-60.  HEENT:  He has no palpable lymphadenopathy or thyromegaly.  Teeth in good  repair.  Tongue is normal.  The midline.  Pupils equally round, reactive to  light.  Facial symmetry is intact.  NECK:  Supple.  LUNGS:  Clear.  At this time, there are no rubs, rales or wheezes.  HEART:  Shows regular rhythm and rate without murmur or gallop.  Rate right  around 60.  ABDOMEN:  Soft and nontender without again organomegaly.  Bowel sounds are  diminished but present.  EXTREMITIES:  He has no peripheral edema at this time.  Pulses in his feet  was 2+ and symmetrical.  NEUROLOGICAL:  He is alert and oriented.   IMPRESSION:  Unfortunately, this gentleman has recurrent pulmonary emboli  with a negative hypercoagulable workup.   The only thing we have not done recently is a workup for an occult  malignancy.  He has had a CT of the chest which is negative.  We will do a  CT of the abdomen and pelvis for completeness sake because of his age in  this recurrence and otherwise negative workup.   I have offered him a second opinion at [facility] if he so desires, but  I think he is someone who will need lifelong Coumadin going forward at this  point in time.   He will stop by the office tomorrow.  We will get his dose of Coumadin from  the past started.  He will continue Lovenox for 7-14 days, and we will see   him back.      Ladona Horns. Mariel Sleet, MD  Electronically Signed     ESN/MEDQ  D:  07/12/2006  T:  07/13/2006  Job:  [id number]   cc:   Annia Friendly. Loleta Chance, MD  Fax: [phone]

## 2011-01-29 NOTE — Discharge Summary (Signed)
NAME:  Gregory Andrews, Gregory Andrews NO.:  [id number]   MEDICAL RECORD NO.:  [id number]          PATIENT TYPE:  OBV   LOCATION:  A203                          FACILITY:  [facility]   PHYSICIAN:  Osvaldo Shipper, MD     DATE OF BIRTH:  08-Aug-1961   DATE OF ADMISSION:  07/11/2006  DATE OF DISCHARGE:  10/31/2007LH                                 DISCHARGE SUMMARY   PRIMARY CARE PHYSICIAN:  Annia Friendly. Hill, MD   He has been actually followed up by Dr. Mariel Sleet, hematologist.   DISCHARGE DIAGNOSES:  1. Acute recurrent pulmonary emboli.  2. 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   Please review my H&P dictated on the day of admission for details regarding  the patient's presenting illness.   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  No DVT was detected in his right  extremity where he was complaining of swelling.   Patient was started on Lovenox and Coumadin.  His pain was controlled with  the help of opioid agents.  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   We consulted Dr. Mariel Sleet to see him because he had followed the patient on  the previous occasion and apparently had done an extensive hypercoagulable  workup, which came back negative.  He ordered a CAT scan of his abdomen and  pelvis to rule out any occult malignancy, and these images do not show any  evidence for masses, tumors, or any other abnormalities in his abdomen and  pelvis except for the lone kidney.  So, in a sense, patient has had  recurrent PE, despite extensive workup for hypercoagulable state with no  etiology found.  Dr. Mariel Sleet thinks that he will now need lifelong  anticoagulation.  Apparently, he has offered the patient a second opinion at  [facility], which he will  discuss with the patient when the patient  follows with him in his clinic.   Patient's pain was controlled with opiate analgesics.  He was feeling well  today.  He was saturating well.  He was able to ambulate with no  difficulties.  Hence, he was considered stable for discharge.   DISCHARGE MEDICATIONS:  1. Lovenox 95 mg subcu q.12h. for five days or until Dr. Mariel Sleet states      that he needs to take it.  2. Coumadin, specialized dosing, as determined by Dr. Mariel Sleet.  3. He was also prescribed about 30 tablets of Vicodin for pain, if any.   DIET:  He may continue with a regular diet.   PHYSICAL ACTIVITY:  No restrictions but he has been told to avoid any high-  contact sports.   The risks, benefits and side effects of Coumadin were discussed in detail  with the patient.  He knew pretty much all of the risks of Coumadin, as he  has been on this  in the past as well.   Imaging studies done include CT angio, CT abdomen and pelvis, lower  extremity Doppler, all of which have been discussed above.  Consultation is  obtained from Dr. Mariel Sleet.   Total time at discharge 35 minutes.      Osvaldo Shipper, MD  Electronically Signed     GK/MEDQ  D:  07/13/2006  T:  07/13/2006  Job:  [id number]   cc:   Annia Friendly. Loleta Chance, MD  Fax: [phone]   Ladona Horns. Mariel Sleet, MD  Fax: [phone]

## 2011-01-29 NOTE — H&P (Signed)
NAME:  Gregory Andrews, Gregory Andrews NO.:  [id number]   MEDICAL RECORD NO.:  [id number]          PATIENT TYPE:  OBV   LOCATION:  A203                          FACILITY:  [facility]   PHYSICIAN:  Osvaldo Shipper, MD     DATE OF BIRTH:  1960-11-10   DATE OF ADMISSION:  07/11/2006  DATE OF DISCHARGE:  LH                                HISTORY & PHYSICAL   PRIMARY CARE PHYSICIAN:  Dr. Mirna Mires.   ADMITTING DIAGNOSES:  1. Acute bilateral pulmonary emboli.  2. History of pulmonary emboli and deep vein thrombosis in the past.   CHIEF COMPLAINT:  Bilateral chest pain for 1 day.   HISTORY OF PRESENT ILLNESS:  The patient is a 50 year old African-American  male, who has history of PEs and DVT in the past, who presented to the ED  today complaining of pain in both sides of his chest starting last night.  The patient has not been on recent road trips or air trips. The patient  mentioned that he was at work when his pain started; was 7/10 in intensity  and this morning was 9/10. He had shortness of breath. No palpitation, no  diaphoresis. He had no fever, no chills. He did have a headache. No history  of cough. The pain was radiating to the back. It was sharp, constant, and  deep breathing made it worse. He gives history of clots in the past as  mentioned above with the last episode about 2 years ago. He was on Coumadin  for about 1 year at that time. He mentioned that no reason has been given  for his recurrent episode of PE.   He also mentioned that he has been evaluated by Dr. Mariel Sleet in his office  for hypercoagulable state.   MEDICATIONS AT HOME:  None.   ALLERGIES:  CODEINE which causes shortness of breath.   PAST MEDICAL HISTORY:  History of PE, the first of which was in March 2005.  He was again diagnosed in October 2005 with another PE and a DVT of his  right lower extremity. He does not have any other medical problems,  otherwise. He has had a left nephrectomy in 50 for  hematuria. 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 He has had a median collateral tear in his knee. He has had a  dislocated shoulder as well.   SOCIAL HISTORY:  Lives in [place] with his wife and daughter. Works in  [employer] which [employer] use. 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 The chemicals include HGN 36 and Q2 3268. He smokes less than 1/2  pack of cigarettes per day. Has been smoking for about 15-20 years. Drinks  alcohol occasionally. No illicit drug use. Independent with his daily  activities.   FAMILY HISTORY:  Unremarkable for any hypercoagulable state or any blood  clots.   REVIEW OF SYSTEMS:  GENERAL REVIEW OF SYSTEMS:  Unremarkable.  CARDIOVASCULAR:  Unremarkable. RESPIRATORY:  See HPI. GI, GU, NEUROLOGICAL,  ENDOCRINE, MUSCULOSKELETAL, all unremarkable.   PHYSICAL EXAMINATION:  Vital signs include temperature 97.9,  blood pressure  124/77, heart rate 60, respiratory rate 18, saturation 99% on room air.  GENERAL EXAM:  This is a well-developed, well-nourished individual in no  distress but in some discomfort.  HEENT:  There is no pallor, no icterus. Oral mucous membranes moist. No oral  lesions are noted.  LUNGS:  Clear to auscultation bilaterally. No wheezes, rales, or rhonchi.  CARDIOVASCULAR:  S1, S2 is normal. No murmurs appreciated.  ABDOMEN:  Is soft, nontender, nondistended. Bowel sounds are present. No  mass or organomegaly appreciated.  EXTREMITIES:  Reveal good peripheral pulses. No edema. No calf tenderness  present.   LABS:  No labs available on this gentleman quite yet.   IMAGING STUDIES:  A CT angio done of the chest which showed bilateral  pulmonary emboli. He also had a lower extremity Doppler of the right leg  which did not show any DVT.   IMPRESSION:  This is a 50 year old African-American male with a past medical  history of pulmonary embolus and deep vein thrombosis who  presents with  recurrent episode of pulmonary embolus. Etiology is unclear. The patient  most likely had some kind of hypercoagulable state which is predisposing him  to have these pulmonary emboli. He is questioning whether any of the  chemicals at his work place are causing these. As per this question, I am  not quite sure. I do not think that any chemical can cause a hypercoagulable  state.   PLAN:  1. Acute PE. Will actually check his blood test first including CBC, PT,      PTT, and his CMET. Would start him on Lovenox and Coumadin. We may have      to adjust for renal function, remembering that he only has 1 kidney.      We will consult Dr. Mariel Sleet essentially to see what workup has been      done in the past as I really do not have access to his records and to      see if any additional workup needs to be done to discern any      hypercoagulable state in this individual.  2. History of left nephrectomy. Appears to be stable. Will check his      kidney function and take it from there.  3. Gastrointestinal prophylaxis will be initiated.   Further management decisions will be based on the results of initial testing  and patient's response to treatment.      Osvaldo Shipper, MD  Electronically Signed     GK/MEDQ  D:  07/11/2006  T:  07/11/2006  Job:  [id number]   cc:   Annia Friendly. Loleta Chance, MD  Fax: [phone]   Ladona Horns. Mariel Sleet, MD  Fax: [phone]

## 2011-01-29 NOTE — Discharge Summary (Signed)
NAME:  Gregory Andrews, SILVA NO.:  [id number]   MEDICAL RECORD NO.:  [id number]                   PATIENT TYPE:  INP   LOCATION:  3731                                 FACILITY:  [facility]   PHYSICIAN:  Harrie Jeans, M.D.                  DATE OF BIRTH:  Dec 27, 1960   DATE OF ADMISSION:  12/11/2003  DATE OF DISCHARGE:  12/17/2003                                 DISCHARGE SUMMARY   DISCHARGE DIAGNOSES:  1. Bilateral pulmonary embolism.  2. Right deep venous thrombosis.  3. Tobacco abuse.  4. Elevated partial thromboplastin time.   DISCHARGE MEDICATIONS:  1. Lovenox 100 mg subcu b.i.d.  2. Coumadin 7.5 mg, dose to be titrated based on INR by primary care     physician.  3. Nicotine patch 14 mg per 24 hours.  4. Percocet 5/325, #20 given.   HISTORY OF PRESENT ILLNESS:  Patient is a 50 year old African-American male  with a past medical history only for hypertension who reported that he was  fairly active prior to admission.  Comes in with sudden onset of right-sided  chest pain and shortness of breath.  He denies any nausea, vomiting,  dysphagia.  He reported the chest pain was pleuritic.  No aggravating or  relieving factors.  Patient, up until this, was a fairly active person with  no recent surgeries or family history of thrombotic event.   CONSULTATIONS:  None.   PROCEDURES:  1. CT of the chest:  Patient has extensive bilateral pulmonary emboli     extending to the upper and lower lobes bilaterally.  There are large     emboli in the right main pulmonary artery as well.  2. CT of the lower extremities:  Deep venous thrombosis of the right leg.   DISCHARGE LABORATORIES:  PT 70.6, INR 1.7.  Hemoglobin 16.6, white blood  cells 6, platelets 223.  Anti-lupus anticoagulant detected.  Cardiolipin  antibody IgA less than 14, which is in the normal range.  Cardiolipin  antibody IgM less than 10, also within the normal range.  Cardiolipin  antibody IgG less than  12, also within the normal range.  BUN 10, creatinine  1.3, AST 19, ALT 17, potassium 3.8.   HOSPITAL COURSE:  1. Pulmonary embolism/DVT.  Patient was admitted to a telemetry bed.  Was     started on therapeutic Lovenox subcu and begun on Coumadin.  Patient did     very well.  Did not have complaints or episodes of voiding.  An     antiphospholipid syndrome was considered in this patient with the initial     thrombotic event.  Unfortunately, the antiphospholipid panel was obtained     after the patient was already started on heparin, which will interfere     with the lupus anticoagulant, which was detected.  This should be     rechecked after the patient has been off of the Lovenox.  It is not     affected by the Coumadin.  The patient was heme negative and had no other     evidence of malignancy, and no further scanning was performed.  Patient     will certainly need to be on Coumadin anticoagulation for 6 months to a     year; however, if the patient does turn out to have antiphospholipid     antibody syndrome, will need lifelong anticoagulation.  2. Tobacco abuse.  The patient did express interest in quitting smoking and     was started on the Nicotine patch.  The patient also expressed an     interest in Zyban, and this may be a consideration for the primary     physician.   FOLLOW UP:  The patient is being discharged today with home Lovenox.  He  will followup with his primary doctor in [place] for his PT INR checks  and dosing of his Coumadin.  I suspect he will likely be therapeutic on his  Coumadin tomorrow and is to follow up with his primary doctor then.                                                Harrie Jeans, M.D.    WA/MEDQ  D:  12/17/2003  T:  12/19/2003  Job:  [id number]   cc:   Katrinka Blazing, M.D.   Loleta Chance, M.D.  [employer]  Fax [phone]  Phone [phone]

## 2011-04-06 ENCOUNTER — Encounter (HOSPITAL_COMMUNITY): Payer: BC Managed Care – PPO | Attending: Oncology

## 2011-04-06 ENCOUNTER — Other Ambulatory Visit (HOSPITAL_COMMUNITY): Payer: Self-pay | Admitting: Oncology

## 2011-04-06 DIAGNOSIS — Z7901 Long term (current) use of anticoagulants: Secondary | ICD-10-CM | POA: Insufficient documentation

## 2011-04-06 DIAGNOSIS — Z86718 Personal history of other venous thrombosis and embolism: Secondary | ICD-10-CM | POA: Insufficient documentation

## 2011-04-06 DIAGNOSIS — I82409 Acute embolism and thrombosis of unspecified deep veins of unspecified lower extremity: Secondary | ICD-10-CM

## 2011-04-06 LAB — PROTIME-INR
INR: 2.38 — ABNORMAL HIGH (ref 0.00–1.49)
Prothrombin Time: 26.4 seconds — ABNORMAL HIGH (ref 11.6–15.2)

## 2011-04-06 NOTE — Progress Notes (Signed)
Labs drawn today for pt.  Coud dose 10mg  and 11mg  alt.  Call patient at [phone]

## 2011-04-07 ENCOUNTER — Telehealth (HOSPITAL_COMMUNITY): Payer: Self-pay | Admitting: *Deleted

## 2011-04-07 NOTE — Telephone Encounter (Signed)
Message copied by Dennie Maizes on Wed Apr 07, 2011  8:46 AM ------      Message from: Ellouise Newer III      Created: Tue Apr 06, 2011  4:13 PM       PT INR in 3 weeks

## 2011-04-07 NOTE — Telephone Encounter (Signed)
Spoke with Gregory Andrews. Instruct to continue same dose Coumadin. PT/INR in 3 weeks 8/14 at 850 am. Pt understands.

## 2011-04-27 ENCOUNTER — Other Ambulatory Visit (HOSPITAL_COMMUNITY): Payer: BC Managed Care – PPO

## 2011-06-02 LAB — PROTIME-INR: INR: 3.1 — ABNORMAL HIGH

## 2011-06-03 LAB — PROTIME-INR
INR: 2.3 — ABNORMAL HIGH
Prothrombin Time: 26.6 — ABNORMAL HIGH

## 2011-06-07 LAB — PROTIME-INR
INR: 4 — ABNORMAL HIGH
Prothrombin Time: 37.5 — ABNORMAL HIGH

## 2011-06-08 LAB — PROTIME-INR
INR: 4 — ABNORMAL HIGH
Prothrombin Time: 20.2 — ABNORMAL HIGH
Prothrombin Time: 41 — ABNORMAL HIGH

## 2011-06-10 LAB — PROTIME-INR
INR: 3.2 — ABNORMAL HIGH
Prothrombin Time: 34.5 — ABNORMAL HIGH

## 2011-06-11 LAB — PROTIME-INR: Prothrombin Time: 27.6 — ABNORMAL HIGH

## 2011-06-18 LAB — PROTIME-INR: INR: 2.2 — ABNORMAL HIGH

## 2011-06-21 LAB — PROTIME-INR: INR: 1.4

## 2011-06-22 LAB — DIFFERENTIAL
Basophils Absolute: 0
Basophils Relative: 0
Lymphocytes Relative: 38
Neutro Abs: 4.3
Neutrophils Relative %: 52

## 2011-06-22 LAB — URINALYSIS, ROUTINE W REFLEX MICROSCOPIC
Nitrite: POSITIVE — AB
Specific Gravity, Urine: 1.025
pH: 6.5

## 2011-06-22 LAB — PROTIME-INR: Prothrombin Time: 42 — ABNORMAL HIGH

## 2011-06-22 LAB — CBC
Platelets: 204
RDW: 13.3

## 2011-06-22 LAB — B-NATRIURETIC PEPTIDE (CONVERTED LAB): Pro B Natriuretic peptide (BNP): <30

## 2011-06-22 LAB — URINE MICROSCOPIC-ADD ON

## 2011-06-24 LAB — PROTIME-INR: Prothrombin Time: 27.2 — ABNORMAL HIGH

## 2011-06-25 LAB — PROTIME-INR
INR: 2.4 — ABNORMAL HIGH
INR: 2.3 — ABNORMAL HIGH
Prothrombin Time: 27.5 — ABNORMAL HIGH
Prothrombin Time: 20.2 — ABNORMAL HIGH
Prothrombin Time: 26.3 — ABNORMAL HIGH

## 2011-07-01 LAB — PROTIME-INR: INR: 2.2 — ABNORMAL HIGH

## 2012-01-09 ENCOUNTER — Emergency Department (HOSPITAL_COMMUNITY): Payer: BC Managed Care – PPO

## 2012-01-09 ENCOUNTER — Emergency Department (HOSPITAL_COMMUNITY)
Admission: EM | Admit: 2012-01-09 | Discharge: 2012-01-09 | Disposition: A | Discharge status: Home / Self Care | Payer: BC Managed Care – PPO | Attending: Emergency Medicine | Admitting: Emergency Medicine

## 2012-01-09 ENCOUNTER — Encounter (HOSPITAL_COMMUNITY): Payer: Self-pay | Admitting: *Deleted

## 2012-01-09 DIAGNOSIS — T148 Other injury of unspecified body region: Secondary | ICD-10-CM | POA: Insufficient documentation

## 2012-01-09 DIAGNOSIS — R51 Headache: Secondary | ICD-10-CM | POA: Insufficient documentation

## 2012-01-09 DIAGNOSIS — W57XXXA Bitten or stung by nonvenomous insect and other nonvenomous arthropods, initial encounter: Secondary | ICD-10-CM | POA: Insufficient documentation

## 2012-01-09 DIAGNOSIS — R0789 Other chest pain: Secondary | ICD-10-CM | POA: Insufficient documentation

## 2012-01-09 HISTORY — DX: Other pulmonary embolism without acute cor pulmonale: I26.99

## 2012-01-09 LAB — CBC
HCT: 45.4 % (ref 39.0–52.0)
Hemoglobin: 16.2 g/dL (ref 13.0–17.0)
MCH: 32.5 pg (ref 26.0–34.0)
MCHC: 35.7 g/dL (ref 30.0–36.0)
RBC: 4.98 MIL/uL (ref 4.22–5.81)

## 2012-01-09 LAB — COMPREHENSIVE METABOLIC PANEL
Albumin: 3.4 g/dL — ABNORMAL LOW (ref 3.5–5.2)
Alkaline Phosphatase: 69 U/L (ref 39–117)
BUN: 15 mg/dL (ref 6–23)
Chloride: 106 mEq/L (ref 96–112)
Creatinine, Ser: 1.46 mg/dL — ABNORMAL HIGH (ref 0.50–1.35)
GFR calc Af Amer: 63 mL/min — ABNORMAL LOW (ref >90)
GFR calc non Af Amer: 55 mL/min — ABNORMAL LOW (ref >90)
Glucose, Bld: 82 mg/dL (ref 70–99)
Potassium: 3.9 mEq/L (ref 3.5–5.1)
Total Bilirubin: 0.2 mg/dL — ABNORMAL LOW (ref 0.3–1.2)

## 2012-01-09 LAB — DIFFERENTIAL
Basophils Relative: 1 % (ref 0–1)
Lymphs Abs: 3.4 10*3/uL (ref 0.7–4.0)
Monocytes Absolute: 0.4 10*3/uL (ref 0.1–1.0)
Monocytes Relative: 6 % (ref 3–12)
Neutro Abs: 3 10*3/uL (ref 1.7–7.7)
Neutrophils Relative %: 43 % (ref 43–77)

## 2012-01-09 LAB — PROTIME-INR: INR: 3.08 — ABNORMAL HIGH (ref 0.00–1.49)

## 2012-01-09 LAB — CK TOTAL AND CKMB (NOT AT ARMC)
CK, MB: 5.3 ng/mL — ABNORMAL HIGH (ref 0.3–4.0)
Total CK: 447 U/L — ABNORMAL HIGH (ref 7–232)

## 2012-01-09 LAB — CARDIAC PANEL(CRET KIN+CKTOT+MB+TROPI)
CK, MB: 4.9 ng/mL — ABNORMAL HIGH (ref 0.3–4.0)
Total CK: 386 U/L — ABNORMAL HIGH (ref 7–232)
Troponin I: <0.3 ng/mL (ref <0.30)

## 2012-01-09 MED ORDER — ACETAMINOPHEN 325 MG PO TABS
650.0000 mg | ORAL_TABLET | Freq: Once | ORAL | Status: AC
  Administered 2012-01-09: 650 mg via ORAL
  Filled 2012-01-09: qty 2

## 2012-01-09 MED ORDER — IOHEXOL 300 MG/ML  SOLN: 100.0000 mL | Freq: Once | INTRAMUSCULAR | Status: DC | PRN

## 2012-01-09 NOTE — ED Notes (Signed)
Patient transported to CT 

## 2012-01-09 NOTE — ED Notes (Signed)
Dr. Norlene Campbell made aware of tick bite on pts left chest.

## 2012-01-09 NOTE — Discharge Instructions (Signed)
Please take Tylenol or Motrin for pain. Return to emergency department for worsening symptoms. You had a blood test done for Lyme disease today, if this is positive you will be contacted. Please followup with your primary care Dr. for recheck in 2-3 days.  Chest Pain (Nonspecific) It is often hard to give a specific diagnosis for the cause of chest pain. There is always a chance that your pain could be related to something serious, such as a heart attack or a blood clot in the lungs. You need to follow up with your caregiver for further evaluation. CAUSES   Heartburn.   Pneumonia or bronchitis.   Anxiety or stress.   Inflammation around your heart (pericarditis) or lung (pleuritis or pleurisy).   A blood clot in the lung.   A collapsed lung (pneumothorax). It can develop suddenly on its own (spontaneous pneumothorax) or from injury (trauma) to the chest.   Shingles infection (herpes zoster virus).  The chest wall is composed of bones, muscles, and cartilage. Any of these can be the source of the pain.  The bones can be bruised by injury.   The muscles or cartilage can be strained by coughing or overwork.   The cartilage can be affected by inflammation and become sore (costochondritis).  DIAGNOSIS  Lab tests or other studies, such as X-rays, electrocardiography, stress testing, or cardiac imaging, may be needed to find the cause of your pain.  TREATMENT   Treatment depends on what may be causing your chest pain. Treatment may include:   Acid blockers for heartburn.   Anti-inflammatory medicine.   Pain medicine for inflammatory conditions.   Antibiotics if an infection is present.   You may be advised to change lifestyle habits. This includes stopping smoking and avoiding alcohol, caffeine, and chocolate.   You may be advised to keep your head raised (elevated) when sleeping. This reduces the chance of acid going backward from your stomach into your esophagus.   Most of the  time, nonspecific chest pain will improve within 2 to 3 days with rest and mild pain medicine.  HOME CARE INSTRUCTIONS   If antibiotics were prescribed, take your antibiotics as directed. Finish them even if you start to feel better.   For the next few days, avoid physical activities that bring on chest pain. Continue physical activities as directed.   Do not smoke.   Avoid drinking alcohol.   Only take over-the-counter or prescription medicine for pain, discomfort, or fever as directed by your caregiver.   Follow your caregiver's suggestions for further testing if your chest pain does not go away.   Keep any follow-up appointments you made. If you do not go to an appointment, you could develop lasting (chronic) problems with pain. If there is any problem keeping an appointment, you must call to reschedule.  SEEK MEDICAL CARE IF:   You think you are having problems from the medicine you are taking. Read your medicine instructions carefully.   Your chest pain does not go away, even after treatment.   You develop a rash with blisters on your chest.  SEEK IMMEDIATE MEDICAL CARE IF:   You have increased chest pain or pain that spreads to your arm, neck, jaw, back, or abdomen.   You develop shortness of breath, an increasing cough, or you are coughing up blood.   You have severe back or abdominal pain, feel nauseous, or vomit.   You develop severe weakness, fainting, or chills.   You have a fever.  THIS IS AN EMERGENCY. Do not wait to see if the pain will go away. Get medical help at once. Call your local emergency services (911 in [place] Do not drive yourself to the hospital. MAKE SURE YOU:   Understand these instructions.   Will watch your condition.   Will get help right away if you are not doing well or get worse.  Document Released: 06/09/2005 Document Revised: 08/19/2011 Document Reviewed: 04/04/2008 [facility] Patient Information 2012 [place], [place].  General Headache,  Without Cause A general headache has no specific cause. These headaches are not life-threatening. They will not lead to other types of headaches. HOME CARE   Make and keep follow-up visits with your doctor.   Only take medicine as told by your doctor.   Try to relax, get a massage, or use your thoughts to control your body (biofeedback).   Apply cold or heat to the head and neck. Apply 3 or 4 times a day or as needed.  Finding out the results of your test Ask when your test results will be ready. Make sure you get your test results. GET HELP RIGHT AWAY IF:   You have problems with medicine.   Your medicine does not help relieve pain.   Your headache changes or becomes worse.   You feel sick to your stomach (nauseous) or throw up (vomit).   You have a temperature by mouth above 102 F (38.9 C), not controlled by medicine.   Your have a stiff neck.   You have vision loss.   You have muscle weakness.   You lose control of your muscles.   You lose balance or have trouble walking.   You feel like you are going to pass out (faint).  MAKE SURE YOU:   Understand these instructions.   Will watch this condition.   Will get help right away if you are not doing well or get worse.  Document Released: 06/08/2008 Document Revised: 08/19/2011 Document Reviewed: 06/08/2008 [facility] Patient Information 2012 [place], [place].

## 2012-01-09 NOTE — ED Notes (Signed)
Followed up with Radiology on time frame for CTA of chest and they stated that someone will come pick pt up in the next  5-10 minutes. Pt and family made aware. Will continue to monitor.

## 2012-01-09 NOTE — ED Notes (Signed)
The pt has had lt upper chest pain all day with some sob and  Nausea.  Hx of pulmonaryemboli

## 2012-01-09 NOTE — ED Notes (Signed)
MD at bedside. 

## 2012-01-09 NOTE — ED Notes (Addendum)
Pt stated that he has been having midsternal chest pain that radiates to his left side starting yesterday. Pain radiates to left side. Pt stated that earlier pain radiated to left arm, bur currently no pain in left arm.  Pain is 6 out of 10. Pt stated that he had constant nausea.  No SOB. Pain does not increase with deep breaths. Pt stated that he has been having a  Headache since Friday. Headache is in the middle of his head and moves to the left side. No blurred vision or neurological deficits. Pain is 6 out of 10. No hx of migraines. Pt stated that a week ago he went fishing and he had to pull a tick off of his rt side. Spot is small raised area, that is red. No respiratory distress.  Will continue to monitor.

## 2012-01-09 NOTE — ED Provider Notes (Addendum)
History     CSN: [id number]  Arrival date & time 01/09/12  Gregory Andrews   First MD Initiated Contact with Patient 01/09/12 0034      Chief Complaint  Patient presents with  . Chest Pain  . Migraine    (Consider location/radiation/quality/duration/timing/severity/associated sxs/prior treatment) Patient is a 51 y.o. male presenting with chest pain and migraine.  Chest Pain    Migraine Associated symptoms include chest pain.   51 year old male presents to emergency room complaining of left-sided headache and left-sided chest pain. Patient reports symptoms started yesterday morning after waking. Symptoms, gradually and then constant through the day. Patient has had nausea without vomiting. Patient denies any photophobia or phonophobia. No prior history of similar headaches. Headache is dull in nature, reports has eased off since initial. Headache not described as worst in his life, no thunderclap onset. No pain medicines taken for the headache. Patient reports left-sided chest pain from sternum over into axilla. Pain is worse with movement, palpation and with deep breaths. Patient is concerned as he has a history of PE and DVT. He is on Coumadin for this. No missed doses of Coumadin. Patient without shortness of breath or pleuritic chest pain. Patient denies cardiac history, no family history of cardiac disease. Patient recently started on nicotine patch 3 days ago and is trying to quit smoking. No cough no rash patient denies any trauma or new activities.  Pt reports recent tick bite, thinks tick was in place for about 2 days.   Past Medical History  Diagnosis Date  . Pulmonary embolism     History reviewed. No pertinent past surgical history.  No family history on file.  History  Substance Use Topics  . Smoking status: Current Everyday Smoker  . Smokeless tobacco: Not on file  . Alcohol Use: Yes      Review of Systems  Cardiovascular: Positive for chest pain.  All other systems  reviewed and are negative.    Allergies  Codeine  Home Medications   Current Outpatient Rx  Name Route Sig Dispense Refill  . WARFARIN SODIUM 10 MG PO TABS Oral Take 10 mg by mouth daily.      BP 123/88  Pulse 70  Temp(Src) 98 F (36.7 C) (Oral)  Resp 18  Ht 5\' 10"  (1.778 m)  Wt 215 lb (97.523 kg)  BMI 30.85 kg/m2  SpO2 100%  Physical Exam  Nursing note and vitals reviewed. Constitutional: He is oriented to person, place, and time. He appears well-developed and well-nourished.  HENT:  Head: Normocephalic and atraumatic.  Right Ear: External ear normal.  Left Ear: External ear normal.  Nose: Nose normal.  Mouth/Throat: Oropharynx is clear and moist.  Eyes: Conjunctivae and EOM are normal. Pupils are equal, round, and reactive to light.  Neck: Normal range of motion. Neck supple. No JVD present. No tracheal deviation present. No thyromegaly present.  Cardiovascular: Normal rate, regular rhythm, normal heart sounds and intact distal pulses.  Exam reveals no gallop and no friction rub.   No murmur heard. Pulmonary/Chest: Effort normal and breath sounds normal. No stridor. No respiratory distress. He has no wheezes. He has no rales. He exhibits no tenderness.  Abdominal: Soft. Bowel sounds are normal. He exhibits no distension and no mass. There is no tenderness. There is no rebound and no guarding.  Musculoskeletal: Normal range of motion. He exhibits no edema and no tenderness.  Lymphadenopathy:    He has no cervical adenopathy.  Neurological: He is oriented to person, place,  and time. He has normal reflexes. No cranial nerve deficit. He exhibits normal muscle tone. Coordination normal.  Skin: Skin is dry. No rash noted. No erythema. No pallor.  Psychiatric: He has a normal mood and affect. His behavior is normal. Judgment and thought content normal.    ED Course  Procedures (including critical care time)  Labs Reviewed  DIFFERENTIAL - Abnormal; Notable for the  following:    Lymphocytes Relative 49 (*)    All other components within normal limits  COMPREHENSIVE METABOLIC PANEL - Abnormal; Notable for the following:    Creatinine, Ser 1.46 (*)    Albumin 3.4 (*)    Total Bilirubin 0.2 (*)    GFR calc non Af Amer 55 (*)    GFR calc Af Amer 63 (*)    All other components within normal limits  CK TOTAL AND CKMB - Abnormal; Notable for the following:    Total CK 447 (*)    CK, MB 5.3 (*)    All other components within normal limits  PROTIME-INR - Abnormal; Notable for the following:    Prothrombin Time 32.3 (*)    INR 3.08 (*)    All other components within normal limits  CBC  TROPONIN I  B. BURGDORFI ANTIBODIES  CARDIAC PANEL(CRET KIN+CKTOT+MB+TROPI)   Ct Angio Chest W/cm &/or Wo Cm  01/09/2012  *RADIOLOGY REPORT*  Clinical Data: Chest pain  CT ANGIOGRAPHY CHEST  Technique:  Multidetector CT imaging of the chest using the standard protocol during bolus administration of intravenous contrast. Multiplanar reconstructed images including MIPs were obtained and reviewed to evaluate the vascular anatomy.  Contrast:  100 ml Omnipaque-300  Comparison: 07/11/2006  Findings: Suboptimal contrast bolus. There is a linear filling defect within the right lower lobe pulmonary artery (image 170 series 6). This corresponds to location of a prior pulmonary embolism and has a somewhat webbed configuration.  Otherwise, no main branch pulmonary arterial filling defect.  More peripheral branches are suboptimally evaluated.  Normal caliber aorta.  Normal heart size.  No pleural or pericardial effusion.  Limited images through the upper abdomen show left hemidiaphragm eventration.  No acute abnormality.  Central airways are patent.  Mild dependent scarring versus atelectasis. Tiny nodule along the left major fissure likely represents a lymph node.  Otherwise, lungs are clear.  IMPRESSION: Linear filling defect within the right lower lobe pulmonary artery, in keeping with an  age indeterminate pulmonary embolism.  The webbed configuration suggests that this may be chronic.  Original Report Authenticated By: Waneta Martins, M.D.    Date: 01/09/2012  Rate: 90  Rhythm: normal sinus rhythm  QRS Axis: normal  Intervals: normal  ST/T Wave abnormalities: normal  Conduction Disutrbances:right bundle branch block  Narrative Interpretation: new Rbbb from 2005  Old EKG Reviewed: changes noted    1. Atypical chest pain   2. Headache   3. Tick bite       MDM    51 year old male with chest pain and headache. Patient with history of PE on Coumadin. Pain does not seem cardiac in nature, no ST elevation on EKG, I am concerned about possible need for repeat PE. We'll check cardiac markers, CT imaging of chest. Given recent tick bite will send for Lyme antibodies. If negative will have patient followup with his primary care provider.        Olivia Mackie, MD 01/09/12 [id number]  Olivia Mackie, MD 01/09/12 [phone]

## 2012-01-13 ENCOUNTER — Other Ambulatory Visit (HOSPITAL_COMMUNITY): Payer: Self-pay | Admitting: Oncology

## 2012-01-14 ENCOUNTER — Other Ambulatory Visit (HOSPITAL_COMMUNITY): Payer: Self-pay | Admitting: Oncology

## 2012-01-14 ENCOUNTER — Telehealth (HOSPITAL_COMMUNITY): Payer: Self-pay

## 2012-01-14 DIAGNOSIS — I2699 Other pulmonary embolism without acute cor pulmonale: Secondary | ICD-10-CM

## 2012-01-14 MED ORDER — WARFARIN SODIUM 10 MG PO TABS: 10.0000 mg | ORAL_TABLET | Freq: Every day | ORAL | Status: DC

## 2012-01-14 NOTE — Telephone Encounter (Signed)
Call from patient. Per patient has not been back for labs or follow-up due to work, insurance, issues at home, etc.  Reinforced importance of having lab work as scheduled and having routine follow-up with MD and/or PA.  Appointments made for blood work on 5/10 and to see PA on 5/13.  1 month's supply of Coumadin to be e-scribed by PA.  Verbalized understanding.

## 2012-01-14 NOTE — Telephone Encounter (Addendum)
Message left for patient to contact clinic.  Message copied by Sterling Big on Fri Jan 14, 2012 11:34 AM ------      Message from: Ellouise Newer III      Created: Fri Jan 14, 2012 10:57 AM       Call patient and see if someone else is monitoring his INR.  If so, they should refill his Coumadin.  If he has not been getting it monitored, he needs lab work and an office visit before it is refilled.  If he is unable to do this, he needs to follow-up with PCP and allow him to consume his anticoagulation monitoring.                  ----- Message -----         From: Evelena Leyden, RN         Sent: 01/14/2012   9:10 AM           To: Ellouise Newer, PA            Not to my knowledge.  He was a no show for his last scheduled follow-up and lab visit.      ----- Message -----         From: Ellouise Newer, PA         Sent: 01/13/2012   5:29 PM           To: Onc Nurse Ap            Are we following his INR?  He has not been here since go-live

## 2012-01-14 NOTE — Telephone Encounter (Signed)
Message copied by Sterling Big on Fri Jan 14, 2012 11:26 AM ------      Message from: Ellouise Newer III      Created: Fri Jan 14, 2012 10:57 AM       Call patient and see if someone else is monitoring his INR.  If so, they should refill his Coumadin.  If he has not been getting it monitored, he needs lab work and an office visit before it is refilled.  If he is unable to do this, he needs to follow-up with PCP and allow him to consume his anticoagulation monitoring.                  ----- Message -----         From: Evelena Leyden, RN         Sent: 01/14/2012   9:10 AM           To: Ellouise Newer, PA            Not to my knowledge.  He was a no show for his last scheduled follow-up and lab visit.      ----- Message -----         From: Ellouise Newer, PA         Sent: 01/13/2012   5:29 PM           To: Onc Nurse Ap            Are we following his INR?  He has not been here since go-live

## 2012-01-21 ENCOUNTER — Encounter (HOSPITAL_COMMUNITY): Payer: BC Managed Care – PPO | Attending: Oncology

## 2012-01-21 ENCOUNTER — Encounter (HOSPITAL_COMMUNITY): Payer: Self-pay | Admitting: Oncology

## 2012-01-21 ENCOUNTER — Other Ambulatory Visit (HOSPITAL_COMMUNITY): Payer: Self-pay | Admitting: Oncology

## 2012-01-21 DIAGNOSIS — I2699 Other pulmonary embolism without acute cor pulmonale: Secondary | ICD-10-CM

## 2012-01-21 DIAGNOSIS — I82409 Acute embolism and thrombosis of unspecified deep veins of unspecified lower extremity: Secondary | ICD-10-CM

## 2012-01-21 DIAGNOSIS — Z91199 Patient's noncompliance with other medical treatment and regimen due to unspecified reason: Secondary | ICD-10-CM

## 2012-01-21 DIAGNOSIS — Z9119 Patient's noncompliance with other medical treatment and regimen: Secondary | ICD-10-CM | POA: Insufficient documentation

## 2012-01-21 DIAGNOSIS — Z7901 Long term (current) use of anticoagulants: Secondary | ICD-10-CM | POA: Insufficient documentation

## 2012-01-21 HISTORY — DX: Other pulmonary embolism without acute cor pulmonale: I26.99

## 2012-01-21 HISTORY — DX: Patient's noncompliance with other medical treatment and regimen: Z91.19

## 2012-01-21 HISTORY — DX: Patient's noncompliance with other medical treatment and regimen due to unspecified reason: Z91.199

## 2012-01-21 HISTORY — DX: Acute embolism and thrombosis of unspecified deep veins of unspecified lower extremity: I82.409

## 2012-01-21 LAB — PROTIME-INR: INR: 2.49 — ABNORMAL HIGH (ref 0.00–1.49)

## 2012-01-21 NOTE — Progress Notes (Unsigned)
Gregory Andrews presented for labwork. Labs per MD order drawn via Peripheral Line 25 gauge needle inserted in rt AC.  Good blood return present. Procedure without incident.  Needle removed intact. Patient tolerated procedure well. Coumadin dose 10 mg daily

## 2012-01-24 ENCOUNTER — Encounter (HOSPITAL_BASED_OUTPATIENT_CLINIC_OR_DEPARTMENT_OTHER): Payer: BC Managed Care – PPO | Admitting: Oncology

## 2012-01-24 ENCOUNTER — Encounter (HOSPITAL_COMMUNITY): Payer: Self-pay | Admitting: Oncology

## 2012-01-24 VITALS — BP 133/90 | HR 90 | Temp 97.4°F | Wt 198.1 lb

## 2012-01-24 DIAGNOSIS — Z7901 Long term (current) use of anticoagulants: Secondary | ICD-10-CM

## 2012-01-24 DIAGNOSIS — Z9119 Patient's noncompliance with other medical treatment and regimen: Secondary | ICD-10-CM

## 2012-01-24 DIAGNOSIS — I82409 Acute embolism and thrombosis of unspecified deep veins of unspecified lower extremity: Secondary | ICD-10-CM

## 2012-01-24 DIAGNOSIS — I2699 Other pulmonary embolism without acute cor pulmonale: Secondary | ICD-10-CM

## 2012-01-24 NOTE — Patient Instructions (Signed)
Gregory Andrews  [id number] 31-Jul-1961 Dr. Glenford Peers   [facility]  Discharge Instructions  RECOMMENDATIONS MADE BY THE CONSULTANT AND ANY TEST RESULTS WILL BE SENT TO YOUR REFERRING DOCTOR.   EXAM FINDINGS BY MD TODAY AND SIGNS AND SYMPTOMS TO REPORT TO CLINIC OR PRIMARY MD:  Discussion per PA.  Will continue to monitor your PT/INR and will see you back in 6 months.  MEDICATIONS PRESCRIBED: none   INSTRUCTIONS GIVEN AND DISCUSSED: Other :  Report shortness of breath, chest pain, swelling of extremity, etc.  SPECIAL INSTRUCTIONS/FOLLOW-UP: Lab work Needed as scheduled and Return to Clinic to be seen in follow-up in 6 months.   I acknowledge that I have been informed and understand all the instructions given to me and received a copy. I do not have any more questions at this time, but understand that I may call the Specialty Clinic at [facility] at [phone] during business hours should I have any further questions or need assistance in obtaining follow-up care.    __________________________________________  _____________  __________ Signature of Patient or Authorized Representative            Date                   Time    __________________________________________ Nurse's Signature

## 2012-01-24 NOTE — Progress Notes (Signed)
No primary provider on file. No primary provider on file.  1. Recurrent pulmonary embolism   2. Recurrent DVT (deep venous thrombosis)   3. Noncompliance     CURRENT THERAPY: Anticoagulation with 10 mg of Coumadin daily  INTERVAL HISTORY: Gregory Andrews 51 y.o. male returns for  regular  visit for followup of recurrent PE and recurrent DVT.  Gregory Andrews been noncompliant with his appointments. We saw him last in December of 2011 with a followup appointment scheduled in one month. He failed to return for that appointment. He is not been compliant with his INRs. He maintains his Coumadin, but has not been getting his INR checked by [place]. We do conversation about this. Explained patient that I be more than happy to be flexible and work with the patient. I've asked him to simply call our clinic and he is unable to make any appointments and reschedule these. I understand that he is having some financial difficulties associated with his insurance. He does have an elevated co-pay. Therefore we'll see him every 6 months. It is very important that he maintains contact with [place] and maintains getting his INR checked. If he fails to do so, we will release her from the clinic and ask him to follow his primary care physician. He understands this issue. He has agreed that he will maintain better followup.  The patient reports that he recently went to [place] at the end of April of this year for chest pain. He is worried that he had another pulmonary embolism. A CT angiogram was performed which shows a neared filling defect within the right lower lobe pulmonary artery, age indeterminate. The web the configuration suggested this may be a chronic pulmonary embolism.  The patient was discharged with Tylenol for a potential muscle strain.  He asks if he can take ibuprofen. I've asked him to alternate ibuprofen with Tylenol. He explains that he has not taken any pain medication and number of days. He says that his  discomfort is easily manageable.  Otherwise, the patient denies any complaints. He provided me with multiple stories as to why he's been busy and unable to make any appointments including being audited by the [employer] requiring a $5300 repayment and his recent divorce settlement.     Past Medical History  Diagnosis Date  . Pulmonary embolism   . Recurrent pulmonary embolism 01/21/2012    On life-long Coumadin  . 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 Noncompliance 01/21/2012    Missed appointments and lab appointments  . Shoulder separation     left    has Recurrent pulmonary embolism; Recurrent DVT (deep venous thrombosis); and Noncompliance on his problem list.     is allergic to codeine.  Mr. Gregory Andrews does not currently have medications on file.  Past Surgical History  Procedure Date  . Left kidney removed   . Right knee surgery     Denies any headaches, dizziness, double vision, fevers, chills, night sweats, nausea, vomiting, diarrhea, constipation, chest pain, heart palpitations, shortness of breath, blood in stool, black tarry stool, urinary pain, urinary burning, urinary frequency, hematuria.   PHYSICAL EXAMINATION  ECOG PERFORMANCE STATUS: 0 - Asymptomatic  Filed Vitals:   01/24/12 1523  BP: 133/90  Pulse: 90  Temp: 97.4 F (36.3 C)    GENERAL:alert, healthy, no distress, well nourished, well developed, comfortable, cooperative and smiling SKIN: skin color, texture, turgor are normal, no rashes or significant lesions HEAD: Normocephalic, No  masses, lesions, tenderness or abnormalities EYES: normal, EOMI, Conjunctiva are pink and non-injected EARS: External ears normal OROPHARYNX:lips, buccal mucosa, and tongue normal and mucous membranes are moist  NECK: supple, trachea midline LYMPH:  not examined BREAST:not examined LUNGS: clear to auscultation  HEART: regular rate & rhythm, no murmurs, no gallops, S1 normal and S2  normal ABDOMEN:abdomen soft, non-tender and normal bowel sounds BACK: Back symmetric, no curvature. EXTREMITIES:less then 2 second capillary refill, no joint deformities, effusion, or inflammation, no edema, no skin discoloration, no clubbing, no cyanosis  NEURO: alert & oriented x 3 with fluent speech, no focal motor/sensory deficits, gait normal   LABORATORY DATA: Lab Results  Component Value Date   INR 2.49* 01/21/2012   INR 3.08* 01/09/2012   INR 2.38* 04/06/2011     ASSESSMENT:  1. History of recurrent pulmonary emboli, on lifelong Coumadin. 2. History of deep vein thrombosis in the past, on lifelong Coumadin.   PLAN:  1. We had a long discussion regarding his compliance. He is in agreement with the future plan which will require followup INR is and every 6 month appointment. We will help facilitate this with only providing the patient with one-month supply of Coumadin initially. 2. PT/INR in 2 weeks. 3. Recommendation regarding alternating Tylenol with ibuprofen for his muscle strain. 4. I personally reviewed and went over laboratory results with the patient. 5. I personally reviewed and went over radiographic studies with the patient. 6. Return in 6  Months for follow-up   All questions were answered. The patient knows to call the clinic with any problems, questions or concerns. We can certainly see the patient much sooner if necessary.  Gregory Andrews

## 2012-02-04 ENCOUNTER — Other Ambulatory Visit (HOSPITAL_COMMUNITY): Payer: BC Managed Care – PPO

## 2012-02-09 ENCOUNTER — Other Ambulatory Visit (HOSPITAL_COMMUNITY): Payer: BC Managed Care – PPO

## 2012-02-14 ENCOUNTER — Other Ambulatory Visit (HOSPITAL_COMMUNITY): Payer: Self-pay | Admitting: Oncology

## 2012-02-15 ENCOUNTER — Encounter (HOSPITAL_COMMUNITY): Payer: Self-pay | Admitting: Oncology

## 2012-02-15 ENCOUNTER — Other Ambulatory Visit (HOSPITAL_COMMUNITY): Payer: Self-pay | Admitting: Oncology

## 2012-02-15 DIAGNOSIS — I2699 Other pulmonary embolism without acute cor pulmonale: Secondary | ICD-10-CM

## 2012-02-15 MED ORDER — WARFARIN SODIUM 10 MG PO TABS: 10.0000 mg | ORAL_TABLET | Freq: Every day | ORAL | Status: DC

## 2012-02-16 ENCOUNTER — Other Ambulatory Visit (HOSPITAL_COMMUNITY): Payer: Self-pay | Admitting: Oncology

## 2012-02-16 ENCOUNTER — Encounter (HOSPITAL_COMMUNITY): Payer: Self-pay | Admitting: Oncology

## 2012-03-14 ENCOUNTER — Encounter (HOSPITAL_COMMUNITY): Payer: Self-pay | Admitting: Oncology

## 2012-03-16 ENCOUNTER — Other Ambulatory Visit (HOSPITAL_COMMUNITY): Payer: Self-pay | Admitting: Oncology

## 2012-03-20 ENCOUNTER — Other Ambulatory Visit (HOSPITAL_COMMUNITY): Payer: Self-pay | Admitting: Oncology

## 2012-03-20 DIAGNOSIS — I2699 Other pulmonary embolism without acute cor pulmonale: Secondary | ICD-10-CM

## 2012-03-20 MED ORDER — WARFARIN SODIUM 10 MG PO TABS: 10.0000 mg | ORAL_TABLET | Freq: Every day | ORAL | Status: DC

## 2012-04-14 ENCOUNTER — Emergency Department (HOSPITAL_COMMUNITY)
Admission: EM | Admit: 2012-04-14 | Discharge: 2012-04-14 | Disposition: A | Discharge status: Home / Self Care | Payer: BC Managed Care – PPO | Attending: Emergency Medicine | Admitting: Emergency Medicine

## 2012-04-14 ENCOUNTER — Encounter (HOSPITAL_COMMUNITY): Payer: Self-pay | Admitting: *Deleted

## 2012-04-14 DIAGNOSIS — Z7901 Long term (current) use of anticoagulants: Secondary | ICD-10-CM | POA: Insufficient documentation

## 2012-04-14 DIAGNOSIS — Z885 Allergy status to narcotic agent status: Secondary | ICD-10-CM | POA: Insufficient documentation

## 2012-04-14 DIAGNOSIS — N419 Inflammatory disease of prostate, unspecified: Secondary | ICD-10-CM

## 2012-04-14 DIAGNOSIS — Z86718 Personal history of other venous thrombosis and embolism: Secondary | ICD-10-CM | POA: Insufficient documentation

## 2012-04-14 DIAGNOSIS — Z87891 Personal history of nicotine dependence: Secondary | ICD-10-CM | POA: Insufficient documentation

## 2012-04-14 DIAGNOSIS — N400 Enlarged prostate without lower urinary tract symptoms: Secondary | ICD-10-CM | POA: Insufficient documentation

## 2012-04-14 DIAGNOSIS — Z79899 Other long term (current) drug therapy: Secondary | ICD-10-CM | POA: Insufficient documentation

## 2012-04-14 HISTORY — DX: Benign prostatic hyperplasia without lower urinary tract symptoms: N40.0

## 2012-04-14 MED ORDER — TAMSULOSIN HCL 0.4 MG PO CAPS: 0.4000 mg | ORAL_CAPSULE | Freq: Every day | ORAL | Status: DC

## 2012-04-14 MED ORDER — CEPHALEXIN 500 MG PO CAPS: 500.0000 mg | ORAL_CAPSULE | Freq: Three times a day (TID) | ORAL | Status: AC

## 2012-04-14 NOTE — ED Provider Notes (Signed)
History   This chart was scribed for Joya Gaskins, MD by Charolett Bumpers . The patient was seen in room APA19/APA19. Patient's care was started at 1230.    CSN: [id number]  Arrival date & time 04/14/12  1218   First MD Initiated Contact with Patient 04/14/12 1230      Chief Complaint  Patient presents with  . Benign Prostatic Hypertrophy     HPI Gregory Andrews is a 51 y.o. male who presents to the Emergency Department complaining of constant, moderate prostate pain for the past 2 weeks. Pt states that his symptoms are worsening. Pt reports a h/o enlarge prostate and reports associated clear discharge. Pt reports associated increased urinary frequency especially at night and lower back pain. Pt denies any urinary difficulty including stopping mid stream or dysuria. Pt reports normal BMs. Pt denies any weakness or numbness of extremities. Pt reports that he was given medication by his PCP for his previous episode to shrink his prostate.  Pt states that he is on Coumadin. Pt reports h/o kidney problems and benign prostatic hyperplasia.   PCP: Dr. Loleta Chance  Past Medical History  Diagnosis Date  . Pulmonary embolism   . Recurrent pulmonary embolism 01/21/2012    On life-long Coumadin  . 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 Noncompliance 01/21/2012    Missed appointments and lab appointments  . Shoulder separation     left  . BPH (benign prostatic hyperplasia)     Past Surgical History  Procedure Date  . Left kidney removed   . Right knee surgery     Family History  Problem Relation Age of Onset  . Anesthesia problems Neg Hx     History  Substance Use Topics  . Smoking status: Former Smoker -- 0.7 packs/day    Types: Cigarettes  . Smokeless tobacco: Former Neurosurgeon    Quit date: 01/12/2012  . Alcohol Use: Yes     beer  1 - 2 week      Review of Systems  Constitutional: Negative for fever.  Gastrointestinal: Positive  for rectal pain. Negative for vomiting and abdominal pain.  Genitourinary: Positive for frequency. Negative for dysuria and difficulty urinating.  Musculoskeletal: Positive for back pain.  All other systems reviewed and are negative.    Allergies  Codeine  Home Medications   Current Outpatient Rx  Name Route Sig Dispense Refill  . WARFARIN SODIUM 10 MG PO TABS Oral Take 1 tablet (10 mg total) by mouth daily. 30 tablet 0    BP 129/90  Pulse 76  Temp 98.7 F (37.1 C) (Oral)  Resp 20  Ht 5\' 10"  (1.778 m)  Wt 205 lb (92.987 kg)  BMI 29.41 kg/m2  SpO2 100%  Physical Exam CONSTITUTIONAL: Well developed/well nourished HEAD AND FACE: Normocephalic/atraumatic EYES: EOMI/PERRL ENMT: Mucous membranes moist NECK: supple no meningeal signs SPINE:entire spine nontender CV: S1/S2 noted, no murmurs/rubs/gallops noted LUNGS: Lungs are clear to auscultation bilaterally, no apparent distress ABDOMEN: soft, nontender, no rebound or guarding GU:no cva tenderness Rectal - no rectal tenderness.  No mass or abscess.  Prostate is enlarged and mildly tender. No nodules noted  He is circumsized and no hernia noted and no testicular tenderness, chaperone present NEURO: Pt is awake/alert, moves all extremitiesx4 EXTREMITIES: pulses normal, full ROM SKIN: warm, color normal PSYCH: no abnormalities of mood noted  ED Course  Procedures  DIAGNOSTIC STUDIES: Oxygen Saturation is 100% on room air, normal by  my interpretation.    COORDINATION OF CARE:  12:39-Discussed planned course of treatment with the patient including a rectal exam, abx and f/u with Urologist, who is agreeable at this time.   12:41-Performed rectal exam with chaperon present.  Pt well appearing, reports simlar to prior episodes of possible prostate infection It seems reasonable to start keflex and flomax and I advised repeat INR in one week.  I advised close urology followup for his possible BPH and further testing.  Pt/family  agreeable      MDM  Nursing notes including past medical history and social history reviewed and considered in documentation   I personally performed the services described in this documentation, which was scribed in my presence. The recorded information has been reviewed and considered.         Joya Gaskins, MD 04/14/12 1315

## 2012-04-14 NOTE — ED Notes (Signed)
Low abd pain for 1 week that pt says is prostate pain.  No NVD, no fever or chills.

## 2012-04-17 ENCOUNTER — Other Ambulatory Visit (HOSPITAL_COMMUNITY): Payer: Self-pay | Admitting: Oncology

## 2012-04-18 ENCOUNTER — Telehealth (HOSPITAL_COMMUNITY): Payer: Self-pay | Admitting: *Deleted

## 2012-04-18 NOTE — Telephone Encounter (Signed)
Pt called for coumadin refill. No pt-inr since May. Advised him that he would have to come in for labs prior to refilling medication.

## 2012-04-19 ENCOUNTER — Encounter (HOSPITAL_COMMUNITY): Payer: Self-pay | Admitting: *Deleted

## 2012-07-26 ENCOUNTER — Ambulatory Visit (HOSPITAL_COMMUNITY): Payer: BC Managed Care – PPO | Admitting: Oncology

## 2012-10-03 ENCOUNTER — Other Ambulatory Visit (HOSPITAL_COMMUNITY): Payer: Self-pay | Admitting: Urology

## 2012-10-03 DIAGNOSIS — N400 Enlarged prostate without lower urinary tract symptoms: Secondary | ICD-10-CM

## 2012-10-03 DIAGNOSIS — R3129 Other microscopic hematuria: Secondary | ICD-10-CM

## 2012-10-06 ENCOUNTER — Ambulatory Visit (HOSPITAL_COMMUNITY)
Admission: RE | Admit: 2012-10-06 | Discharge: 2012-10-06 | Disposition: A | Discharge status: Home / Self Care | Payer: BC Managed Care – PPO | Source: Ambulatory Visit | Attending: Urology | Admitting: Urology

## 2012-10-06 DIAGNOSIS — R3129 Other microscopic hematuria: Secondary | ICD-10-CM

## 2012-10-06 DIAGNOSIS — N200 Calculus of kidney: Secondary | ICD-10-CM | POA: Insufficient documentation

## 2012-10-06 DIAGNOSIS — R1032 Left lower quadrant pain: Secondary | ICD-10-CM | POA: Insufficient documentation

## 2012-10-06 DIAGNOSIS — N400 Enlarged prostate without lower urinary tract symptoms: Secondary | ICD-10-CM

## 2012-10-06 DIAGNOSIS — R1031 Right lower quadrant pain: Secondary | ICD-10-CM | POA: Insufficient documentation

## 2012-10-06 MED ORDER — IOHEXOL 300 MG/ML  SOLN
125.0000 mL | Freq: Once | INTRAMUSCULAR | Status: AC | PRN
  Administered 2012-10-06: 125 mL via INTRAVENOUS

## 2012-10-31 ENCOUNTER — Emergency Department (HOSPITAL_COMMUNITY): Payer: BC Managed Care – PPO

## 2012-10-31 ENCOUNTER — Inpatient Hospital Stay (HOSPITAL_COMMUNITY)
Admission: EM | Admit: 2012-10-31 | Discharge: 2012-11-03 | DRG: 541 | Disposition: A | Discharge status: Home / Self Care | Payer: BC Managed Care – PPO | Attending: Family Medicine | Admitting: Family Medicine

## 2012-10-31 ENCOUNTER — Encounter (HOSPITAL_COMMUNITY): Payer: Self-pay | Admitting: Emergency Medicine

## 2012-10-31 DIAGNOSIS — Z91199 Patient's noncompliance with other medical treatment and regimen due to unspecified reason: Secondary | ICD-10-CM

## 2012-10-31 DIAGNOSIS — I82409 Acute embolism and thrombosis of unspecified deep veins of unspecified lower extremity: Secondary | ICD-10-CM

## 2012-10-31 DIAGNOSIS — I825Y9 Chronic embolism and thrombosis of unspecified deep veins of unspecified proximal lower extremity: Secondary | ICD-10-CM | POA: Diagnosis present

## 2012-10-31 DIAGNOSIS — I824Z9 Acute embolism and thrombosis of unspecified deep veins of unspecified distal lower extremity: Secondary | ICD-10-CM | POA: Diagnosis present

## 2012-10-31 DIAGNOSIS — Z885 Allergy status to narcotic agent status: Secondary | ICD-10-CM

## 2012-10-31 DIAGNOSIS — N400 Enlarged prostate without lower urinary tract symptoms: Secondary | ICD-10-CM | POA: Diagnosis present

## 2012-10-31 DIAGNOSIS — I4510 Unspecified right bundle-branch block: Secondary | ICD-10-CM | POA: Diagnosis present

## 2012-10-31 DIAGNOSIS — I2699 Other pulmonary embolism without acute cor pulmonale: Secondary | ICD-10-CM

## 2012-10-31 DIAGNOSIS — Z9119 Patient's noncompliance with other medical treatment and regimen: Secondary | ICD-10-CM

## 2012-10-31 DIAGNOSIS — Z87891 Personal history of nicotine dependence: Secondary | ICD-10-CM

## 2012-10-31 DIAGNOSIS — I825Z9 Chronic embolism and thrombosis of unspecified deep veins of unspecified distal lower extremity: Secondary | ICD-10-CM | POA: Diagnosis present

## 2012-10-31 DIAGNOSIS — Z86711 Personal history of pulmonary embolism: Secondary | ICD-10-CM

## 2012-10-31 DIAGNOSIS — I824Y9 Acute embolism and thrombosis of unspecified deep veins of unspecified proximal lower extremity: Secondary | ICD-10-CM | POA: Diagnosis present

## 2012-10-31 DIAGNOSIS — Z86718 Personal history of other venous thrombosis and embolism: Secondary | ICD-10-CM

## 2012-10-31 DIAGNOSIS — Z791 Long term (current) use of non-steroidal anti-inflammatories (NSAID): Secondary | ICD-10-CM

## 2012-10-31 DIAGNOSIS — D696 Thrombocytopenia, unspecified: Secondary | ICD-10-CM | POA: Diagnosis present

## 2012-10-31 LAB — CBC WITH DIFFERENTIAL/PLATELET
Basophils Absolute: 0 10*3/uL (ref 0.0–0.1)
Basophils Relative: 0 % (ref 0–1)
Eosinophils Absolute: 0.1 10*3/uL (ref 0.0–0.7)
Eosinophils Relative: 1 % (ref 0–5)
HCT: 44.6 % (ref 39.0–52.0)
Hemoglobin: 16 g/dL (ref 13.0–17.0)
MCH: 31.8 pg (ref 26.0–34.0)
MCHC: 35.9 g/dL (ref 30.0–36.0)
Monocytes Absolute: 0.9 10*3/uL (ref 0.1–1.0)
Monocytes Relative: 11 % (ref 3–12)
Neutro Abs: 5 10*3/uL (ref 1.7–7.7)
RDW: 13 % (ref 11.5–15.5)

## 2012-10-31 LAB — COMPREHENSIVE METABOLIC PANEL
AST: 15 U/L (ref 0–37)
Albumin: 3.4 g/dL — ABNORMAL LOW (ref 3.5–5.2)
BUN: 12 mg/dL (ref 6–23)
Calcium: 9 mg/dL (ref 8.4–10.5)
Chloride: 105 mEq/L (ref 96–112)
Creatinine, Ser: 1.06 mg/dL (ref 0.50–1.35)
Total Bilirubin: 0.7 mg/dL (ref 0.3–1.2)
Total Protein: 7.2 g/dL (ref 6.0–8.3)

## 2012-10-31 LAB — POCT I-STAT TROPONIN I: Troponin i, poc: 0.01 ng/mL (ref 0.00–0.08)

## 2012-10-31 NOTE — ED Notes (Signed)
Pt taken to xray by Demetria Pore

## 2012-10-31 NOTE — ED Notes (Signed)
Pt c/o sudden onset of pain under right arm radiating up and across chest about 20 mins ago.  Also c/o shortness of breath with pain.  Pt describes pain as sharp.

## 2012-10-31 NOTE — ED Provider Notes (Addendum)
History     CSN: [id number]  Arrival date & time 10/31/12  2249   First MD Initiated Contact with Patient 10/31/12 2315      Chief Complaint  Patient presents with  . Chest Pain    (Consider location/radiation/quality/duration/timing/severity/associated sxs/prior treatment) Patient is a 52 y.o. male presenting with chest pain. The history is provided by the patient.  Chest Pain Pain location:  R chest Pain quality: aching   Pain radiates to:  Does not radiate Pain radiates to the back: no   Pain severity:  Moderate Onset quality:  Sudden Duration:  2 hours Timing:  Constant Chronicity:  Recurrent Context comment:  Hx of dvt, pe, not on coumadin x Relieved by:  Nothing Worsened by:  Nothing tried Ineffective treatments:  None tried Risk factors: prior DVT/PE and smoking     Past Medical History  Diagnosis Date  . Pulmonary embolism   . Recurrent pulmonary embolism 01/21/2012    On life-long Coumadin  . 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 Noncompliance 01/21/2012    Missed appointments and lab appointments  . Shoulder separation     left  . BPH (benign prostatic hyperplasia)     Past Surgical History  Procedure Laterality Date  . Left kidney removed    . Right knee surgery      Family History  Problem Relation Age of Onset  . Anesthesia problems Neg Hx     History  Substance Use Topics  . Smoking status: Former Smoker -- 0.75 packs/day    Types: Cigarettes  . Smokeless tobacco: Former Neurosurgeon    Quit date: 01/12/2012  . Alcohol Use: Yes     Comment: beer  1 - 2 week      Review of Systems  Cardiovascular: Positive for chest pain.  All other systems reviewed and are negative.    Allergies  Codeine  Home Medications   Current Outpatient Rx  Name  Route  Sig  Dispense  Refill  . ibuprofen (ADVIL,MOTRIN) 200 MG tablet   Oral   Take 200 mg by mouth every 6 (six) hours as needed for pain.           BP 134/94  Pulse 77  Temp(Src) 98.5 F (36.9 C) (Oral)  Resp 18  SpO2 100%  Physical Exam  Constitutional: He is oriented to person, place, and time. He appears well-developed and well-nourished.  HENT:  Head: Normocephalic and atraumatic.  Eyes: Conjunctivae are normal. Pupils are equal, round, and reactive to light.  Neck: Normal range of motion. Neck supple.  Cardiovascular: Normal rate, regular rhythm, normal heart sounds and intact distal pulses.   Pulmonary/Chest: Effort normal and breath sounds normal.  Abdominal: Soft. Bowel sounds are normal.  Neurological: He is alert and oriented to person, place, and time.  Skin: Skin is warm and dry.  Psychiatric: He has a normal mood and affect. His behavior is normal. Judgment and thought content normal.    ED Course  Procedures (including critical care time)  Labs Reviewed  CBC WITH DIFFERENTIAL - Abnormal; Notable for the following:    Platelets 146 (*)    All other components within normal limits  COMPREHENSIVE METABOLIC PANEL  POCT I-STAT TROPONIN I   Dg Chest 2 View  10/31/2012  *RADIOLOGY REPORT*  Clinical Data: Chest pain  CHEST - 2 VIEW  Comparison: 01/09/2012 CT  Findings: Elevated left hemidiaphragm.  Cardiomediastinal contours within normal range. No confluent  airspace opacity, pleural effusion, or pneumothorax.  No acute osseous finding  IMPRESSION: No radiographic evidence of acute cardiopulmonary process.   Original Report Authenticated By: Jearld Lesch, M.D.      No diagnosis found.   Date: 10/31/2012  Rate: 91   Rhythm: normal sinus rhythm  QRS Axis: right  Intervals: normal  ST/T Wave abnormalities: nonspecific ST changes  Conduction Disutrbances:right bundle branch block  Narrative Interpretation:   Old EKG Reviewed: unchanged   MDM  + cp,  Hx of pe.  Not compliant with coumdain x , smoker.  Will ct chest,  reassess    + bilat pe.  Will anticoagulate,  admit    Rosanne Ashing, MD 10/31/12 2348  Hannia Matchett Lytle Michaels, MD 11/01/12 [phone]

## 2012-11-01 ENCOUNTER — Encounter (HOSPITAL_COMMUNITY): Payer: Self-pay | Admitting: Radiology

## 2012-11-01 ENCOUNTER — Emergency Department (HOSPITAL_COMMUNITY): Payer: BC Managed Care – PPO

## 2012-11-01 DIAGNOSIS — M79609 Pain in unspecified limb: Secondary | ICD-10-CM

## 2012-11-01 DIAGNOSIS — I2699 Other pulmonary embolism without acute cor pulmonale: Principal | ICD-10-CM

## 2012-11-01 DIAGNOSIS — R609 Edema, unspecified: Secondary | ICD-10-CM

## 2012-11-01 DIAGNOSIS — I517 Cardiomegaly: Secondary | ICD-10-CM

## 2012-11-01 LAB — TROPONIN I: Troponin I: <0.3 ng/mL (ref <0.30)

## 2012-11-01 LAB — CBC
MCHC: 35.2 g/dL (ref 30.0–36.0)
MCV: 89.4 fL (ref 78.0–100.0)
Platelets: 139 10*3/uL — ABNORMAL LOW (ref 150–400)
RDW: 12.8 % (ref 11.5–15.5)
WBC: 9.5 10*3/uL (ref 4.0–10.5)

## 2012-11-01 LAB — HEPARIN LEVEL (UNFRACTIONATED): Heparin Unfractionated: 0.73 IU/mL — ABNORMAL HIGH (ref 0.30–0.70)

## 2012-11-01 LAB — BASIC METABOLIC PANEL
Chloride: 103 mEq/L (ref 96–112)
Creatinine, Ser: 1.11 mg/dL (ref 0.50–1.35)
GFR calc Af Amer: 88 mL/min — ABNORMAL LOW (ref >90)
GFR calc non Af Amer: 76 mL/min — ABNORMAL LOW (ref >90)

## 2012-11-01 LAB — PROTIME-INR: INR: 1.04 (ref 0.00–1.49)

## 2012-11-01 MED ORDER — ENOXAPARIN SODIUM 80 MG/0.8ML ~~LOC~~ SOLN
80.0000 mg | Freq: Two times a day (BID) | SUBCUTANEOUS | Status: DC
  Administered 2012-11-01 – 2012-11-03 (×4): 80 mg via SUBCUTANEOUS
  Filled 2012-11-01 (×7): qty 0.8

## 2012-11-01 MED ORDER — SODIUM CHLORIDE 0.9 % IJ SOLN
3.0000 mL | Freq: Two times a day (BID) | INTRAMUSCULAR | Status: DC
  Administered 2012-11-01 – 2012-11-02 (×4): 3 mL via INTRAVENOUS
  Filled 2012-11-01: qty 3

## 2012-11-01 MED ORDER — HEPARIN (PORCINE) IN NACL 100-0.45 UNIT/ML-% IJ SOLN
1600.0000 [IU]/h | INTRAMUSCULAR | Status: DC
  Administered 2012-11-01: 1600 [IU]/h via INTRAVENOUS
  Filled 2012-11-01 (×3): qty 250

## 2012-11-01 MED ORDER — HYDROCODONE-ACETAMINOPHEN 5-325 MG PO TABS
1.0000 | ORAL_TABLET | ORAL | Status: DC | PRN
  Administered 2012-11-01 – 2012-11-02 (×2): 2 via ORAL
  Administered 2012-11-02 (×2): 1 via ORAL
  Administered 2012-11-03: 2 via ORAL
  Filled 2012-11-01 (×2): qty 2
  Filled 2012-11-01: qty 1
  Filled 2012-11-01: qty 2
  Filled 2012-11-01: qty 1

## 2012-11-01 MED ORDER — HEPARIN BOLUS VIA INFUSION
5000.0000 [IU] | Freq: Once | INTRAVENOUS | Status: AC
  Administered 2012-11-01: 5000 [IU] via INTRAVENOUS

## 2012-11-01 MED ORDER — IOHEXOL 350 MG/ML SOLN
100.0000 mL | Freq: Once | INTRAVENOUS | Status: AC | PRN
  Administered 2012-11-01: 100 mL via INTRAVENOUS

## 2012-11-01 MED ORDER — WARFARIN SODIUM 10 MG PO TABS
10.0000 mg | ORAL_TABLET | Freq: Once | ORAL | Status: AC
  Administered 2012-11-01: 10 mg via ORAL
  Filled 2012-11-01 (×2): qty 1

## 2012-11-01 MED ORDER — HEPARIN (PORCINE) IN NACL 100-0.45 UNIT/ML-% IJ SOLN
1500.0000 [IU]/h | INTRAMUSCULAR | Status: AC
  Filled 2012-11-01: qty 250

## 2012-11-01 MED ORDER — WARFARIN - PHARMACIST DOSING INPATIENT: Freq: Every day | Status: DC

## 2012-11-01 NOTE — Progress Notes (Signed)
Utilization Review Completed.   Mattisyn Cardona, RN, BSN Nurse Case Manager  [phone]  

## 2012-11-01 NOTE — Progress Notes (Signed)
[facility] PROGRESS NOTE  Gregory Andrews OZH:[id number] DOB: 07-25-1961 DOA: 10/31/2012 PCP: [name], MD Hematologist: formerly Dr. Mariel Sleet  Assessment/Plan: 1. Acute recurrent bilateral pulmonary embolism: Secondary to noncompliance with warfarin. Hemodynamically stable. Echocardiogram with normal right ventricular function. EKG sinus rhythm, chronic right bundle branch block compared to study 01/09/2012. Lengthy discussion with patient, offered options including Xarelto, however he does not want to start new agent but prefers to restart Coumadin. He is familiar with Lovenox injections. He reports he will be compliant. Transition from heparin to Lovenox. 2. History of recurrent DVT, pulmonary embolism 3. Thrombocytopenia: Mild, present on admission. 4. Noncompliance: Discussed with hematology clinic at [facility], patient with long history of noncompliance and will need to seek followup care with an alternate facility/provider.  Code Status: Full code Family Communication: Discussed with fianc and brother at bedside Disposition Plan: Home, likely 2/20  [name], MD  [facility] Team 6 Pager [phone] If 7PM-7AM, please contact night-coverage at [email or web address], password [facility] 11/01/2012, 9:26 AM  LOS: 1 day   Brief narrative: 52 y.o. male who presents with R sided chest pain, aching in quality, moderate in severity suddenly onset 2 hours ago. Pain has been constant since onset and occurs in the context of history of at least 2 DVT/PEs in the past, he is supposed to be on lifelong coumadin for his recurrent pulmonary emboli and his recurrent embolus in the past occurred while off coumadin. Unfortunatly the patient has not been on coumadin for the past 6 months. In the ED he was found to have new bilateral PEs, hospitalist has been asked to admit.  Consultants:  None  Procedures:  2/19 2-D echocardiogram:  HPI/Subjective: Afebrile, vital signs stable.  Cardiac enzymes negative. No chest pain. Mild shortness of breath with ambulation.  Objective: Filed Vitals:   11/01/12 0145 11/01/12 0200 11/01/12 0215 11/01/12 0255  BP: 141/100 132/92 148/89 167/97  Pulse: 79 73 78   Temp:    97.8 F (36.6 C)  TempSrc:    Oral  Resp: 13 19 15 16   Height:    5\' 10"  (1.778 m)  Weight:    83.2 kg (183 lb 6.8 oz)  SpO2: 100% 95% 98% 100%    Intake/Output Summary (Last 24 hours) at 11/01/12 0926 Last data filed at 11/01/12 0730  Gross per 24 hour  Intake    360 ml  Output      0 ml  Net    360 ml   Filed Weights   11/01/12 0119 11/01/12 0255  Weight: 90.719 kg (200 lb) 83.2 kg (183 lb 6.8 oz)    Exam:  General:  Appears calm and comfortable Cardiovascular: RRR, no m/r/g. No LE edema. Telemetry: SR, no arrhythmias  Respiratory: CTA bilaterally, no w/r/r. Normal respiratory effort. Psychiatric: grossly normal mood and affect, speech fluent and appropriate   Data Reviewed: Basic Metabolic Panel:  Recent Labs Lab 10/31/12 2257 11/01/12 0300  NA 137 138  K 3.8 3.6  CL 105 103  CO2 21 26  GLUCOSE 112* 88  BUN 12 12  CREATININE 1.06 1.11  CALCIUM 9.0 8.9   Liver Function Tests:  Recent Labs Lab 10/31/12 2257  AST 15  ALT 11  ALKPHOS 70  BILITOT 0.7  PROT 7.2  ALBUMIN 3.4*   CBC:  Recent Labs Lab 10/31/12 2257 11/01/12 0300  WBC 8.9 9.5  NEUTROABS 5.0  --   HGB 16.0 15.1  HCT 44.6 42.9  MCV 88.7 89.4  PLT 146*  139*   Cardiac Enzymes:  Recent Labs Lab 11/01/12 0300 11/01/12 0800  TROPONINI <0.30 <0.30     Studies: Dg Chest 2 View  10/31/2012  *RADIOLOGY REPORT*  Clinical Data: Chest pain  CHEST - 2 VIEW  Comparison: 01/09/2012 CT  Findings: Elevated left hemidiaphragm.  Cardiomediastinal contours within normal range. No confluent airspace opacity, pleural effusion, or pneumothorax.  No acute osseous finding  IMPRESSION: No radiographic evidence of acute cardiopulmonary process.   Original Report  Authenticated By: Jearld Lesch, M.D.    Ct Angio Chest W/cm &/or Wo Cm  11/01/2012  *RADIOLOGY REPORT*  Clinical Data: Chest pain, evaluate for PE  CT ANGIOGRAPHY CHEST  Technique:  Multidetector CT imaging of the chest using the standard protocol during bolus administration of intravenous contrast. Multiplanar reconstructed images including MIPs were obtained and reviewed to evaluate the vascular anatomy.  Contrast: OMNIPAQUE IOHEXOL 350 MG/ML SOLN  Comparison: Chest x-ray 10/31/2012; prior chest CT 01/09/2012  Findings:  Mediastinum: Unremarkable CT appearance of the thyroid gland.  No suspicious mediastinal or hilar adenopathy.  No anterior mediastinal mass.  Unremarkable thoracic esophagus.  Heart/Vascular: Adequate evaluation the pulmonary arteries to the segmental level.  New bilateral acute pulmonary emboli.  Filling defects identified in the upper lobar pulmonary artery extending into segmental and subsegmental branches, within the right middle lobe or pulmonary artery, and within the right segmental pulmonary arteries the right segmental pulmonary arterial filling defects are more linear and may represent chronic PE.  On the left, pedis identified within segmental arteries in the left upper lung, within the segmental arteries to the lingula and left lower lobe are segmental arteries.  No evidence of right heart strain.  The heart remains within normal limits for size.  No pericardial effusion.  Lungs/Pleura: The lungs are clear.  No significant emphysema, bronchial wall thickening, consolidation pneumothorax or pleural effusion.  Trace dependent atelectasis in the left lower lobe.  Upper Abdomen: Unremarkable visualized upper abdomen.  Bones: No acute fracture or aggressive appearing lytic or blastic osseous lesion.  IMPRESSION:  Acute bilateral PE involving all lobes of both lungs.  The overall embolic burden is moderate and extends to the lobar level on the right and segmental level on the  left.  No evidence of right heart strain.  Critical Value/emergent results were called by telephone at the time of interpretation on 11/01/2012 at 01:10 a.m. to Dr. Verl Bangs, who verbally acknowledged these results.   Original Report Authenticated By: Malachy Moan, M.D.     Scheduled Meds: . sodium chloride  3 mL Intravenous Q12H  . Warfarin - Pharmacist Dosing Inpatient   Does not apply q1800   Continuous Infusions: . heparin 1,600 Units/hr (11/01/12 0441)    Principal Problem:   Recurrent pulmonary embolism Active Problems:   Noncompliance     [name], MD  [facility] Team 6 Pager [phone] If 7PM-7AM, please contact night-coverage at [email or web address], password [facility] 11/01/2012, 9:26 AM  LOS: 1 day   Time spent: 35 minutes, greater than 50% in counseling and coordination of care

## 2012-11-01 NOTE — ED Notes (Addendum)
Dr Julian Reil at bedside. Pt c/o increasing chest pain at 8/10.

## 2012-11-01 NOTE — ED Notes (Signed)
Per Dr. Julian Reil, pt's chest pain is not cardiac related but is r/t only to pt's admission dx of PE.

## 2012-11-01 NOTE — Progress Notes (Signed)
ANTICOAGULATION CONSULT NOTE - Initial Consult  Pharmacy Consult for heparin Indication: pulmonary embolus  Allergies  Allergen Reactions  . Codeine Shortness Of Breath    Patient Measurements: Height: 5\' 10"  (177.8 cm) Weight: 200 lb (90.719 kg) IBW/kg (Calculated) : 73 Heparin Dosing Weight: 91 kg   Vital Signs: Temp: 98.5 F (36.9 C) (02/18 2255) Temp src: Oral (02/18 2255) BP: 139/79 mmHg (02/19 0105) Pulse Rate: 74 (02/19 0030)  Labs:  Recent Labs  10/31/12 2257  HGB 16.0  HCT 44.6  PLT 146*  CREATININE 1.06    Estimated Creatinine Clearance: 94.5 ml/min (by C-G formula based on Cr of 1.06).   Medical History: Past Medical History  Diagnosis Date  . Pulmonary embolism   . Recurrent pulmonary embolism 01/21/2012    On life-long Coumadin  . 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 Noncompliance 01/21/2012    Missed appointments and lab appointments  . Shoulder separation     left  . BPH (benign prostatic hyperplasia)     Medications:  Scheduled:    Assessment: 52 yo male admitted with chest pain found to have acute bilateral PE in all lobes of both lungs per CT chest. Patient with h/o of recurrent PE and DVT, but patient reports not taking Coumadin x 6 months. Pharmacy to manage IV heparin and Coumadin.   Goal of Therapy:  Heparin level 0.3-0.7 units/ml Monitor platelets by anticoagulation protocol: Yes   Plan:  1. Heparin IV bolus of 5000 units x 1, then IV infusion of 1600 units/hr.  2. Heparin level in 6 hours.  3. Daily CBC, heparin level, PT/INR 4. Coumadin 10mg  po x 1.   Emeline Gins 11/01/2012,1:24 AM

## 2012-11-01 NOTE — Progress Notes (Addendum)
ANTICOAGULATION CONSULT NOTE - Follow Up Consult  Pharmacy Consult for Heparin >> to Lovenox (see Addendum below) Indication: New PE (hx prior PE)  Allergies  Allergen Reactions  . Codeine Shortness Of Breath    Patient Measurements: Height: 5\' 10"  (177.8 cm) Weight: 183 lb 6.8 oz (83.2 kg) IBW/kg (Calculated) : 73 Heparin Dosing Weight: 83.2 kg  Vital Signs: Temp: 97.8 F (36.6 C) (02/19 0255) Temp src: Oral (02/19 0255) BP: 167/97 mmHg (02/19 0255) Pulse Rate: 78 (02/19 0215)  Labs:  Recent Labs  10/31/12 2257 11/01/12 0138 11/01/12 0300 11/01/12 0800  HGB 16.0  --  15.1  --   HCT 44.6  --  42.9  --   PLT 146*  --  139*  --   LABPROT  --  13.5  --   --   INR  --  1.04  --   --   HEPARINUNFRC  --   --   --  0.73*  CREATININE 1.06  --  1.11  --   TROPONINI  --   --  <0.30 <0.30    Estimated Creatinine Clearance: 82.2 ml/min (by C-G formula based on Cr of 1.11).   Medications:  Heparin @ 1600 units/hr  Assessment: 52 y.o. M on heparin + warfarin with new/recurrent PE in the setting of prior hx PE/DVT. The patient was not on any long-term anticoagulation PTA -- noted hx noncompliance. Heparin level this morning is slightly SUPRAtherapeutic (HL 0.73, goal of 0.3-0.7). Baseline INR is 1.04, effect of warfarin dose will not be seen until 2/20 a.m as dose was given this morning. Will wait to re-dose warfarin at that time.  Today is VTE overlap D#1/5 as recommended per CHEST guidelines.  Will plan on educating the patient on a long-term anticoagulant prior to discharge -- however per discussion with Dr. Irene Limbo this morning -- still deciding which agent would be the best option in this patient.   Goal of Therapy:  INR 2-3 Heparin level 0.3-0.7 units/ml Monitor platelets by anticoagulation protocol: Yes   Plan:  1. Reduce heparin drip rate slightly to 1500 units/hr (15 ml/hr) 2. No warfarin dose this evening -- dose already given today 3. Will continue to monitor  for any signs/symptoms of bleeding and will follow up with heparin level in 6 hours   Georgina Pillion, PharmD, BCPS Clinical Pharmacist Pager: [phone] 11/01/2012 11:15 AM    ---------------------------------------------------------------------- Addendum:  The physician has decided to transition this patient to lovenox for continued bridging to therapeutic INR with warfarin. Will continue heparin drip at current rate until a time appropriate to make the transition to lovenox. Transition plans for the two agents was discussed with the nurse Delice Bison).  Plan 1. Continue heparin drip rate at current rate of 1500 units/hr (15 ml/hr) until 1700 today -- then discontinue 2. Start lovenox 80 mg SQ every 12 hours at 1800 today (~1 hr after heparin drip stopped) 3. Will re-dose warfarin with INR on 2/20 a.m 4. Will continue to monitor for s/sx of bleeding  Georgina Pillion, PharmD, BCPS Clinical Pharmacist Pager: [phone] 11/01/2012 1:30 PM

## 2012-11-01 NOTE — Progress Notes (Signed)
Patient has arrived on the unit from the ED. Prior to arrival receiving nurse took report from ED nurse Irving Burton), and she informed the receiving nurse that the patient's pain level was an 8/10. The receiving nurse was unsure whether or not the patient could be received on the unit due to the 8/10 pain. The receiving nurse informed the ED nurse to call him prior to transport about patient's pain level. Irving Burton the ED nurse called the receiving nurse back and informed him that her charge nurse and Dr. Beacher May educated her that the patient could be admitted to the unit regardless of pain level, if the pain is not cardiac related. The ED nurse stated as a result of information given by her charge nurse and Dr. Beacher May, she was going to commence bringing the patient to the unit. When the patient arrived he stated his pain level was 4/10. Harmon Pier

## 2012-11-01 NOTE — H&P (Signed)
[facility] History and Physical  Gregory Andrews AVW:[id number] DOB: 1961/01/16 DOA: 10/31/2012  Referring physician: ED PCP: Ky Barban, MD  Specialists: Mariel Sleet - heme  Chief Complaint: Chest pain  HPI: Gregory Andrews is a 52 y.o. male who presents with R sided chest pain, aching in quality, moderate in severity suddenly onset 2 hours ago.  Pain has been constant since onset and occurs in the context of history of at least 2 DVT/PEs in the past, he is supposed to be on lifelong coumadin for his recurrent pulmonary emboli and his recurrent embolus in the past occurred while off coumadin.  Unfortunatly the patient has not been on coumadin for the past 6 months.  In the ED he was found to have new bilateral PEs, hospitalist has been asked to admit.  Review of Systems: 12 systems reviewed and otherwise negative.  Past Medical History  Diagnosis Date  . Pulmonary embolism   . Recurrent pulmonary embolism 01/21/2012    On life-long Coumadin  . 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 Noncompliance 01/21/2012    Missed appointments and lab appointments  . Shoulder separation     left  . BPH (benign prostatic hyperplasia)    Past Surgical History  Procedure Laterality Date  . Left kidney removed    . Right knee surgery     Social History:  reports that he has quit smoking. His smoking use included Cigarettes. He smoked 0.75 packs per day. He quit smokeless tobacco use about 9 months ago. He reports that  drinks alcohol. He reports that he does not use illicit drugs.   Allergies  Allergen Reactions  . Codeine Shortness Of Breath    Family History  Problem Relation Age of Onset  . Anesthesia problems Neg Hx      Prior to Admission medications   Medication Sig Start Date End Date Taking? Authorizing Provider  ibuprofen (ADVIL,MOTRIN) 200 MG tablet Take 200 mg by mouth every 6 (six) hours as needed for pain.   Yes  Historical Provider, MD   Physical Exam: Filed Vitals:   11/01/12 0030 11/01/12 0105 11/01/12 0115 11/01/12 0119  BP: 147/92 139/79 152/98   Pulse: 74  71   Temp:      TempSrc:      Resp: 13 15 18    Height:    5\' 10"  (1.778 m)  Weight:    90.719 kg (200 lb)  SpO2: 96% 98% 100%     General:  NAD, resting comfortably in bed Eyes: PEERLA EOMI ENT: mucous membranes moist Neck: supple w/o JVD Cardiovascular: RRR w/o MRG Respiratory: CTA B Abdomen: soft, nt, nd, bs+ Skin: no rash nor lesion Musculoskeletal: MAE, full ROM all 4 extremities Psychiatric: normal tone and affect Neurologic: AAOx3, grossly non-focal  Labs on Admission:  Basic Metabolic Panel:  Recent Labs Lab 10/31/12 2257  NA 137  K 3.8  CL 105  CO2 21  GLUCOSE 112*  BUN 12  CREATININE 1.06  CALCIUM 9.0   Liver Function Tests:  Recent Labs Lab 10/31/12 2257  AST 15  ALT 11  ALKPHOS 70  BILITOT 0.7  PROT 7.2  ALBUMIN 3.4*   No results found for this basename: LIPASE, AMYLASE,  in the last 168 hours No results found for this basename: AMMONIA,  in the last 168 hours CBC:  Recent Labs Lab 10/31/12 2257  WBC 8.9  NEUTROABS 5.0  HGB 16.0  HCT 44.6  MCV 88.7  PLT 146*   Cardiac Enzymes: No results found for this basename: CKTOTAL, CKMB, CKMBINDEX, TROPONINI,  in the last 168 hours  BNP (last 3 results) No results found for this basename: PROBNP,  in the last 8760 hours CBG: No results found for this basename: GLUCAP,  in the last 168 hours  Radiological Exams on Admission: Dg Chest 2 View  10/31/2012  *RADIOLOGY REPORT*  Clinical Data: Chest pain  CHEST - 2 VIEW  Comparison: 01/09/2012 CT  Findings: Elevated left hemidiaphragm.  Cardiomediastinal contours within normal range. No confluent airspace opacity, pleural effusion, or pneumothorax.  No acute osseous finding  IMPRESSION: No radiographic evidence of acute cardiopulmonary process.   Original Report Authenticated By: Jearld Lesch,  M.D.    Ct Angio Chest W/cm &/or Wo Cm  11/01/2012  *RADIOLOGY REPORT*  Clinical Data: Chest pain, evaluate for PE  CT ANGIOGRAPHY CHEST  Technique:  Multidetector CT imaging of the chest using the standard protocol during bolus administration of intravenous contrast. Multiplanar reconstructed images including MIPs were obtained and reviewed to evaluate the vascular anatomy.  Contrast: OMNIPAQUE IOHEXOL 350 MG/ML SOLN  Comparison: Chest x-ray 10/31/2012; prior chest CT 01/09/2012  Findings:  Mediastinum: Unremarkable CT appearance of the thyroid gland.  No suspicious mediastinal or hilar adenopathy.  No anterior mediastinal mass.  Unremarkable thoracic esophagus.  Heart/Vascular: Adequate evaluation the pulmonary arteries to the segmental level.  New bilateral acute pulmonary emboli.  Filling defects identified in the upper lobar pulmonary artery extending into segmental and subsegmental branches, within the right middle lobe or pulmonary artery, and within the right segmental pulmonary arteries the right segmental pulmonary arterial filling defects are more linear and may represent chronic PE.  On the left, pedis identified within segmental arteries in the left upper lung, within the segmental arteries to the lingula and left lower lobe are segmental arteries.  No evidence of right heart strain.  The heart remains within normal limits for size.  No pericardial effusion.  Lungs/Pleura: The lungs are clear.  No significant emphysema, bronchial wall thickening, consolidation pneumothorax or pleural effusion.  Trace dependent atelectasis in the left lower lobe.  Upper Abdomen: Unremarkable visualized upper abdomen.  Bones: No acute fracture or aggressive appearing lytic or blastic osseous lesion.  IMPRESSION:  Acute bilateral PE involving all lobes of both lungs.  The overall embolic burden is moderate and extends to the lobar level on the right and segmental level on the left.  No evidence of right heart  strain.  Critical Value/emergent results were called by telephone at the time of interpretation on 11/01/2012 at 01:10 a.m. to Dr. Verl Bangs, who verbally acknowledged these results.   Original Report Authenticated By: Malachy Moan, M.D.     EKG: Independently reviewed. RBBB, observed on 2013 EKG as well.  Assessment/Plan Principal Problem:   Recurrent pulmonary embolism Active Problems:   Noncompliance   1. Recurrent PE - This will be the patients 3rd PE at least, the patient is aware that this PE occurred because he was not on coumadin.  Stressed again to the patient the importance of being on lifelong coumadin given that he has clearly demonstrated that he will form unprovoked PEs when he is not on it (twice now).  Admitting patient to hospital, heparin gtt, coumadin pharm to dose, will get 2d echo, recent abd and pelvis CT (last month) and CT chest today showed no evidence of neoplasm.  At this point do not really feel that repeating a hypercoagulable workup is  warranted as it wont change management (3rd unprovoked PE, unless contraindicated in the future my feeling is that the patient needs to be on lifelong coumadin regardless of what ever his hypercoagulable workup shows).    Code Status: Full Code (must indicate code status--if unknown or must be presumed, indicate so) Family Communication: Spoke with patient and wife at bedside (indicate person spoken with, if applicable, with phone number if by telephone) Disposition Plan: Admit to inpatient (indicate anticipated LOS)  Time spent: 70 min  Lavora Brisbon M. [facility] Pager [phone]  If 7PM-7AM, please contact night-coverage [email or web address] Password [email or web address] 11/01/2012, 2:01 AM

## 2012-11-01 NOTE — Progress Notes (Signed)
*  PRELIMINARY RESULTS* Echocardiogram 2D Echocardiogram has been performed.  Gregory Andrews 11/01/2012, 9:23 AM

## 2012-11-01 NOTE — Plan of Care (Signed)
Problem: Consults Goal: Diagnosis - Venous Thromboembolism (VTE) Choose a selection Outcome: Completed/Met Date Met:  11/01/12 Pulmonary Embolism

## 2012-11-01 NOTE — Progress Notes (Signed)
VASCULAR LAB PRELIMINARY  PRELIMINARY  PRELIMINARY  PRELIMINARY  Bilateral lower extremity venous duplex completed.    Preliminary report:  Right - Positive for acute DVT in the popliteal, gastrocnemius, and femoral veins. Positive for an acute superficial thrombus of the lesser saphenous vein. No evidence of a Baker's cyst.                                 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 No evidence of a superficial thrombus or Baker's cyst.  Willoughby Doell, RVS 11/01/2012, 4:48 PM

## 2012-11-02 DIAGNOSIS — Z9119 Patient's noncompliance with other medical treatment and regimen: Secondary | ICD-10-CM

## 2012-11-02 DIAGNOSIS — I82409 Acute embolism and thrombosis of unspecified deep veins of unspecified lower extremity: Secondary | ICD-10-CM

## 2012-11-02 LAB — CBC
HCT: 45.8 % (ref 39.0–52.0)
Hemoglobin: 15.7 g/dL (ref 13.0–17.0)
MCH: 31.2 pg (ref 26.0–34.0)
MCV: 91.1 fL (ref 78.0–100.0)
RBC: 5.03 MIL/uL (ref 4.22–5.81)

## 2012-11-02 MED ORDER — COUMADIN BOOK
Freq: Once | Status: AC
  Administered 2012-11-02: 13:00:00
  Filled 2012-11-02: qty 1

## 2012-11-02 MED ORDER — WARFARIN VIDEO
Freq: Once | Status: AC
  Administered 2012-11-02: 14:00:00

## 2012-11-02 MED ORDER — WARFARIN SODIUM 10 MG PO TABS
10.0000 mg | ORAL_TABLET | Freq: Once | ORAL | Status: AC
  Administered 2012-11-02: 10 mg via ORAL
  Filled 2012-11-02: qty 1

## 2012-11-02 NOTE — Care Management Note (Signed)
    Page 1 of 1   11/03/2012     4:21:57 PM   CARE MANAGEMENT NOTE 11/03/2012  Patient:  Gregory Andrews, Gregory Andrews   Account Number:  [id number]  Date Initiated:  11/02/2012  Documentation initiated by:  Deasha Clendenin  Subjective/Objective Assessment:   PT ADM WITH BILATERAL PE AND DVT.  PTA, LIVES AT HOME WITH SIGNIFICANT OTHER.     Action/Plan:   PLAN TO DC HOME ON LOVENOX 2/21; WILL CHECK INSURANCE COVERAGE, PHARMACY AVAILABILITY OF DRUG PRIOR TO DC.   Anticipated DC Date:  11/03/2012   Anticipated DC Plan:  HOME/SELF CARE      DC Planning Services  CM consult      Choice offered to / List presented to:             Status of service:  Completed, signed off Medicare Important Message given?   (If response is "NO", the following Medicare IM given date fields will be blank) Date Medicare IM given:   Date Additional Medicare IM given:    Discharge Disposition:  HOME/SELF CARE  Per UR Regulation:  Reviewed for med. necessity/level of care/duration of stay  If discussed at Long Length of Stay Meetings, dates discussed:    Comments:  11/03/12 Gregory Justiss,RN,BSN [phone] PT FOR DC HOME TODAY ON HOME LOVENOX.  HE HAS DONE SELF INJECTIONS OF LOVENOX IN THE PAST, AND FEELS COMFORTABLE WITH THIS.  COPAY ESTIMATED AT $20 OR LESS AT RETAIL DRUG STORE, AND PT NOTIFIED.

## 2012-11-02 NOTE — Progress Notes (Signed)
[facility] PROGRESS NOTE  Gregory Andrews:[id number] DOB: 07/05/1961 DOA: 10/31/2012 PCP: Gregory Courier, MD Hematologist: formerly Dr. Mariel Andrews  Assessment/Plan: 1. Acute recurrent bilateral pulmonary embolism: Secondary to noncompliance with warfarin. Hemodynamically stable. Echocardiogram with normal right ventricular function. EKG sinus rhythm, chronic right bundle branch block compared to study 01/09/2012. Lengthy discussion with patient, offered options including Xarelto, however he does not want to start new agent but prefers to restart Coumadin. Continue Lovenox for a total 5 days or when INR is therapeutic whichever leg. Later. 2. Bilateral acute DVT: Continue Lovenox and warfarin. 3. History of recurrent DVT, pulmonary embolism 4. Noncompliance: Discussed with hematology clinic at [facility], patient with long history of noncompliance and will need to seek followup care with an alternate facility/provider.  Code Status: Full code Family Communication: Disposition Plan: Home, likely 2/21  [name], MD  [facility] Team 6 Pager [phone] If 7PM-7AM, please contact night-coverage at [email or web address], password [facility] 11/02/2012, 12:44 PM  LOS: 2 days   Brief narrative: 52 y.o. male who presents with R sided chest pain, aching in quality, moderate in severity suddenly onset 2 hours ago. Pain has been constant since onset and occurs in the context of history of at least 2 DVT/PEs in the past, he is supposed to be on lifelong coumadin for his recurrent pulmonary emboli and his recurrent embolus in the past occurred while off coumadin. Unfortunatly the patient has not been on coumadin for the past 6 months. In the ED he was found to have new bilateral PEs, hospitalist has been asked to admit.  Consultants:  None  Procedures:  2/19 2-D echocardiogram: Left ventricular ejection fraction 50-55%. Normal wall motion. Right ventricle with normal systolic  function.  2/19 Bilateral lower extremity venous Dopplers: Findings consistent with acute deep vein thrombosis involving the mid to distal femoral, popliteal, and gastrocnemius veins of the right lower extremity. Acute superficial thrombus of the lesser saphenous vein. - Findings consistent with acute deep vein thrombosis involving the proximal popliteal vein of the left lower extremity. - Findings consistent with chronic deep vein thrombosis involving the distal popliteal and proximal posteriortibial veins of the left lower extremity.  HPI/Subjective: Continues to feel better. No chest pain or shortness of breath. Some right calf pain. Ambulating without difficulty.  Objective: Filed Vitals:   11/01/12 0255 11/01/12 1321 11/01/12 1938 11/02/12 0400  BP: 167/97 125/89 142/80 144/94  Pulse:  77 77 77  Temp: 97.8 F (36.6 C) 98 F (36.7 C) 98.5 F (36.9 C) 98 F (36.7 C)  TempSrc: Oral Oral Oral Oral  Resp: 16 18 18 18   Height: 5\' 10"  (1.778 m)     Weight: 83.2 kg (183 lb 6.8 oz)     SpO2: 100% 100% 99% 99%    Intake/Output Summary (Last 24 hours) at 11/02/12 1244 Last data filed at 11/02/12 0700  Gross per 24 hour  Intake    963 ml  Output   2400 ml  Net  -1437 ml   Filed Weights   11/01/12 0119 11/01/12 0255  Weight: 90.719 kg (200 lb) 83.2 kg (183 lb 6.8 oz)    Exam:  General:  Appears calm and comfortable Cardiovascular: RRR, no m/r/g. No LE edema. Mild right calf pain with palpation. Respiratory: CTA bilaterally, no w/r/r. Normal respiratory effort. Psychiatric: grossly normal mood and affect, speech fluent and appropriate   Data Reviewed: Basic Metabolic Panel:  Recent Labs Lab 10/31/12 2257 11/01/12 0300  NA 137 138  K 3.8  3.6  CL 105 103  CO2 21 26  GLUCOSE 112* 88  BUN 12 12  CREATININE 1.06 1.11  CALCIUM 9.0 8.9   Liver Function Tests:  Recent Labs Lab 10/31/12 2257  AST 15  ALT 11  ALKPHOS 70  BILITOT 0.7  PROT 7.2  ALBUMIN 3.4*    CBC:  Recent Labs Lab 10/31/12 2257 11/01/12 0300 11/02/12 0450  WBC 8.9 9.5 6.8  NEUTROABS 5.0  --   --   HGB 16.0 15.1 15.7  HCT 44.6 42.9 45.8  MCV 88.7 89.4 91.1  PLT 146* 139* 159   Cardiac Enzymes:  Recent Labs Lab 11/01/12 0300 11/01/12 0800 11/01/12 1743  TROPONINI <0.30 <0.30 <0.30     Studies: Dg Chest 2 View  10/31/2012  *RADIOLOGY REPORT*  Clinical Data: Chest pain  CHEST - 2 VIEW  Comparison: 01/09/2012 CT  Findings: Elevated left hemidiaphragm.  Cardiomediastinal contours within normal range. No confluent airspace opacity, pleural effusion, or pneumothorax.  No acute osseous finding  IMPRESSION: No radiographic evidence of acute cardiopulmonary process.   Original Report Authenticated By: Jearld Lesch, M.D.    Ct Angio Chest W/cm &/or Wo Cm  11/01/2012  *RADIOLOGY REPORT*  Clinical Data: Chest pain, evaluate for PE  CT ANGIOGRAPHY CHEST  Technique:  Multidetector CT imaging of the chest using the standard protocol during bolus administration of intravenous contrast. Multiplanar reconstructed images including MIPs were obtained and reviewed to evaluate the vascular anatomy.  Contrast: OMNIPAQUE IOHEXOL 350 MG/ML SOLN  Comparison: Chest x-ray 10/31/2012; prior chest CT 01/09/2012  Findings:  Mediastinum: Unremarkable CT appearance of the thyroid gland.  No suspicious mediastinal or hilar adenopathy.  No anterior mediastinal mass.  Unremarkable thoracic esophagus.  Heart/Vascular: Adequate evaluation the pulmonary arteries to the segmental level.  New bilateral acute pulmonary emboli.  Filling defects identified in the upper lobar pulmonary artery extending into segmental and subsegmental branches, within the right middle lobe or pulmonary artery, and within the right segmental pulmonary arteries the right segmental pulmonary arterial filling defects are more linear and may represent chronic PE.  On the left, pedis identified within segmental arteries in the  left upper lung, within the segmental arteries to the lingula and left lower lobe are segmental arteries.  No evidence of right heart strain.  The heart remains within normal limits for size.  No pericardial effusion.  Lungs/Pleura: The lungs are clear.  No significant emphysema, bronchial wall thickening, consolidation pneumothorax or pleural effusion.  Trace dependent atelectasis in the left lower lobe.  Upper Abdomen: Unremarkable visualized upper abdomen.  Bones: No acute fracture or aggressive appearing lytic or blastic osseous lesion.  IMPRESSION:  Acute bilateral PE involving all lobes of both lungs.  The overall embolic burden is moderate and extends to the lobar level on the right and segmental level on the left.  No evidence of right heart strain.  Critical Value/emergent results were called by telephone at the time of interpretation on 11/01/2012 at 01:10 a.m. to Dr. Verl Bangs, who verbally acknowledged these results.   Original Report Authenticated By: Malachy Moan, M.D.     Scheduled Meds: . coumadin book   Does not apply Once  . enoxaparin (LOVENOX) injection  80 mg Subcutaneous Q12H  . sodium chloride  3 mL Intravenous Q12H  . warfarin  10 mg Oral ONCE-1800  . warfarin   Does not apply Once  . Warfarin - Pharmacist Dosing Inpatient   Does not apply q1800   Continuous Infusions:  Principal Problem:   Recurrent pulmonary embolism Active Problems:   Noncompliance     [name], MD  [facility] Team 6 Pager [phone] If 7PM-7AM, please contact night-coverage at [email or web address], password [facility] 11/02/2012, 12:44 PM  LOS: 2 days   Time spent: 20 minutes

## 2012-11-02 NOTE — Progress Notes (Signed)
ANTICOAGULATION CONSULT NOTE - Follow Up Consult  Pharmacy Consult for Lovenox and Coumadin Indication: New PE (hx prior PE)  Allergies  Allergen Reactions  . Codeine Shortness Of Breath    Patient Measurements: Height: 5\' 10"  (177.8 cm) Weight: 183 lb 6.8 oz (83.2 kg) IBW/kg (Calculated) : 73  Vital Signs: Temp: 98 F (36.7 C) (02/20 0400) Temp src: Oral (02/20 0400) BP: 144/94 mmHg (02/20 0400) Pulse Rate: 77 (02/20 0400)  Labs:  Recent Labs  10/31/12 2257 11/01/12 0138 11/01/12 0300 11/01/12 0800 11/01/12 1743 11/02/12 0450  HGB 16.0  --  15.1  --   --  15.7  HCT 44.6  --  42.9  --   --  45.8  PLT 146*  --  139*  --   --  159  LABPROT  --  13.5  --   --   --  13.9  INR  --  1.04  --   --   --  1.08  HEPARINUNFRC  --   --   --  0.73*  --   --   CREATININE 1.06  --  1.11  --   --   --   TROPONINI  --   --  <0.30 <0.30 <0.30  --     Estimated Creatinine Clearance: 82.2 ml/min (by C-G formula based on Cr of 1.11).  Assessment: 52 y.o. M on lovenox + warfarin with new/recurrent PE in the setting of prior hx PE/DVT. The patient was not on any long-term anticoagulation PTA -- noted hx noncompliance. INR with no movement past first dose of coumadin. No bleeding noted. Today is VTE minimum overlap Day #2/5 as recommended per CHEST guidelines.  Goal of Therapy:  INR 2-3; anti-Xa level 0.6-1.2 Iu/ml (4 hrs post dose) Monitor platelets by anticoagulation protocol: Yes   Plan:  1. Continue lovenox 80mg  SQ q12h (if d/c home on lovenox, could use 120mg  SQ q24h) 2. Coumadin 10mg  po today 3. Will continue to monitor for any signs/symptoms of bleeding 4. Will send coumadin educational material - book and video with plans to educate pt tomorrow.  Christoper Fabian, PharmD, BCPS Clinical pharmacist, pager [phone] 11/02/2012 12:20 PM

## 2012-11-03 MED ORDER — WARFARIN SODIUM 2.5 MG PO TABS
12.5000 mg | ORAL_TABLET | Freq: Once | ORAL | Status: DC
  Filled 2012-11-03: qty 1

## 2012-11-03 MED ORDER — ENOXAPARIN SODIUM 120 MG/0.8ML ~~LOC~~ SOLN: 120.0000 mg | SUBCUTANEOUS | Status: DC

## 2012-11-03 MED ORDER — HYDROCODONE-ACETAMINOPHEN 5-325 MG PO TABS: 1.0000 | ORAL_TABLET | ORAL | Status: DC | PRN

## 2012-11-03 MED ORDER — WARFARIN SODIUM 7.5 MG PO TABS
15.0000 mg | ORAL_TABLET | Freq: Once | ORAL | Status: AC
  Administered 2012-11-03: 15 mg via ORAL
  Filled 2012-11-03: qty 2

## 2012-11-03 MED ORDER — ENOXAPARIN SODIUM 80 MG/0.8ML ~~LOC~~ SOLN
80.0000 mg | Freq: Once | SUBCUTANEOUS | Status: AC
  Administered 2012-11-03: 80 mg via SUBCUTANEOUS
  Filled 2012-11-03: qty 0.8

## 2012-11-03 MED ORDER — WARFARIN SODIUM 5 MG PO TABS: 15.0000 mg | ORAL_TABLET | Freq: Every day | ORAL | Status: DC

## 2012-11-03 NOTE — Progress Notes (Addendum)
ANTICOAGULATION CONSULT NOTE - Follow Up Consult  Pharmacy Consult for Lovenox and Coumadin Indication: New PE (hx prior PE)  Allergies  Allergen Reactions  . Codeine Shortness Of Breath    Patient Measurements: Height: 5\' 10"  (177.8 cm) Weight: 183 lb 6.8 oz (83.2 kg) IBW/kg (Calculated) : 73  Vital Signs: Temp: 98.4 F (36.9 C) (02/21 0513) Temp src: Oral (02/21 0513) BP: 122/86 mmHg (02/21 0513) Pulse Rate: 60 (02/21 0513)  Labs:  Recent Labs  10/31/12 2257 11/01/12 0138 11/01/12 0300 11/01/12 0800 11/01/12 1743 11/02/12 0450 11/03/12 0450  HGB 16.0  --  15.1  --   --  15.7  --   HCT 44.6  --  42.9  --   --  45.8  --   PLT 146*  --  139*  --   --  159  --   LABPROT  --  13.5  --   --   --  13.9 13.0  INR  --  1.04  --   --   --  1.08 0.99  HEPARINUNFRC  --   --   --  0.73*  --   --   --   CREATININE 1.06  --  1.11  --   --   --   --   TROPONINI  --   --  <0.30 <0.30 <0.30  --   --     Estimated Creatinine Clearance: 82.2 ml/min (by C-G formula based on Cr of 1.11).  Assessment: 52 y.o. M on lovenox + warfarin with new/recurrent PE in the setting of prior hx PE/DVT. The patient was not on any long-term anticoagulation PTA (was on coumadin but stopped ~6 mos ago) -- noted hx noncompliance. INR with no movement past first 2 doses of coumadin. No bleeding noted. Today is VTE minimum overlap Day #3/5 as recommended per CHEST guidelines. No bleeding noted.  Pt states his previous dose ~6 mos ago was 10mg  daily  Pt educated and education documented via pt education coumadin point.  Goal of Therapy:  INR 2-3; anti-Xa level 0.6-1.2 Iu/ml (4 hrs post dose) Monitor platelets by anticoagulation protocol: Yes   Plan:  1. Continue lovenox 80mg  SQ q12h (if d/c home on lovenox, could use 120mg  SQ q24h). Suspect pt will need at least 5 more days of lovenox 2. Coumadin 15 mg po today, if to d/c home today, consider giving 5mg  tablets - take 3 tablets (15mg ) daily and f/u  INR in 2-3 days 3. Will continue to monitor for any signs/symptoms of bleeding  Christoper Fabian, PharmD, BCPS Clinical pharmacist, pager [phone] 11/03/2012 9:45 AM

## 2012-11-03 NOTE — Discharge Summary (Signed)
Physician Discharge Summary  MARCELLOUS SNARSKI ZOX:[id number] DOB: 07-Mar-1961 DOA: 10/31/2012  PCP: [name], MD Hematologist: formerly Dr. Mariel Sleet  Admit date: 10/31/2012 Discharge date: 11/03/2012  Recommendations for Outpatient Follow-up:  1. Followup PT/INR 2/24, goal INR 2-3  Lab Results  Component Value Date   INR 0.99 11/03/2012   INR 1.08 11/02/2012   INR 1.04 11/01/2012    Follow-up Information   Follow up with [name], MD On 11/06/2012. (Walk-in in the morning for PT/INR check.)    Contact information:   [address] [place] [address] [phone]      Discharge Diagnoses:  1. Acute recurrent bilateral pulmonary embolism 2. Acute bilateral lower extremity DVT 3. Noncompliance with anticoagulant therapy  Discharge Condition: Improved Disposition: Home  Diet recommendation: Regular  Filed Weights   11/01/12 0119 11/01/12 0255  Weight: 90.719 kg (200 lb) 83.2 kg (183 lb 6.8 oz)    History of present illness:  52 y.o. male who presents with R sided chest pain, aching in quality, moderate in severity suddenly onset 2 hours ago. Pain has been constant since onset and occurs in the context of history of at least 2 DVT/PEs in the past, he is supposed to be on lifelong coumadin for his recurrent pulmonary emboli and his recurrent embolus in the past occurred while off coumadin. Unfortunatly the patient has not been on coumadin for the past 6 months. In the ED he was found to have new bilateral PEs, hospitalist has been asked to admit.  Hospital Course:  Mr. Krempasky was admitted for treatment of bilateral PE. Further evaluation also revealed bilateral acute DVT in the lower extremities. He was started on Lovenox and warfarin. Except for mild right calf pain he is asymptomatic with no shortness of breath or chest pain. Telemetry has been unremarkable. 2-D echocardiogram was reassuring. Patient has a history of recurrent venous thromboembolism  and had previously been followed at hematology clinic in [place]. He had been noncompliant with Coumadin therapy and has been dismissed from the clinic. Patient was offered other option as below but declined. He understands the importance of warfarin, the life-threatening nature of venous thromboembolism and reports he will be compliant. The patient is quite familiar with Lovenox and is comfortable giving himself injections. Case management has verified the patient can obtain this medication easily. I spoke with Dr. Loleta Chance today and the patient can followup in his office 2/24 for laboratory work, repeat PT/INR.  1. Acute recurrent bilateral pulmonary embolism: Secondary to noncompliance with warfarin. Hemodynamically stable. Echocardiogram with normal right ventricular function. EKG sinus rhythm, chronic right bundle branch block compared to study 01/09/2012. Lengthy discussion with patient, offered options including Xarelto, however he does not want to start new agent but prefers to restart Coumadin. Continue Lovenox for a total 5 days or when INR is therapeutic whichever later. 2. Bilateral acute DVT: Continue Lovenox and warfarin. Minimally symptomatic with insignificant edema. 3. History of recurrent DVT, pulmonary embolism 4. Noncompliance: Discussed with hematology clinic at [facility], patient with long history of noncompliance and will need to seek followup care with an alternate facility/provider.   Consultants:  None  Procedures:  2/19 2-D echocardiogram: Left ventricular ejection fraction 50-55%. Normal wall motion. Right ventricle with normal systolic function.  2/19 Bilateral lower extremity venous Dopplers: Findings consistent with acute deep vein thrombosis involving the mid to distal femoral, popliteal, and gastrocnemius veins of the right lower extremity. Acute superficial thrombus of the lesser saphenous vein. - Findings  consistent with acute deep vein thrombosis involving the  proximal popliteal vein of the left lower extremity. - Findings consistent with chronic deep vein thrombosis involving the distal popliteal and proximal posteriortibial veins of the left lower extremity.    Discharge Instructions  Discharge Orders   Future Orders Complete By Expires     Diet general  As directed     Discharge instructions  As directed     Comments:      Be sure to followup with your primary care physician 2/24 for repeat blood work (PT/INR). It is very important to continue Lovenox and Coumadin as directed. You were diagnosed with blood clots in both lungs and in both legs. Call your physician or seek immediate medical attention for increased pain, swelling, shortness of breath, chest pain or worsening of condition.    Increase activity slowly  As directed         Medication List    STOP taking these medications       ibuprofen 200 MG tablet  Commonly known as:  ADVIL,MOTRIN      TAKE these medications       enoxaparin 120 MG/0.8ML injection  Commonly known as:  LOVENOX  Inject 0.8 mLs (120 mg total) into the skin daily. Start 2/22 about 1700.     HYDROcodone-acetaminophen 5-325 MG per tablet  Commonly known as:  NORCO/VICODIN  Take 1-2 tablets by mouth every 4 (four) hours as needed for pain.     warfarin 5 MG tablet  Commonly known as:  COUMADIN  Take 3 tablets (15 mg total) by mouth daily at 6 PM.        The results of significant diagnostics from this hospitalization (including imaging, microbiology, ancillary and laboratory) are listed below for reference.    Significant Diagnostic Studies: Dg Chest 2 View  10/31/2012  *RADIOLOGY REPORT*  Clinical Data: Chest pain  CHEST - 2 VIEW  Comparison: 01/09/2012 CT  Findings: Elevated left hemidiaphragm.  Cardiomediastinal contours within normal range. No confluent airspace opacity, pleural effusion, or pneumothorax.  No acute osseous finding  IMPRESSION: No radiographic evidence of acute cardiopulmonary  process.   Original Report Authenticated By: Jearld Lesch, M.D.    Ct Angio Chest W/cm &/or Wo Cm  11/01/2012  *RADIOLOGY REPORT*  Clinical Data: Chest pain, evaluate for PE  CT ANGIOGRAPHY CHEST  Technique:  Multidetector CT imaging of the chest using the standard protocol during bolus administration of intravenous contrast. Multiplanar reconstructed images including MIPs were obtained and reviewed to evaluate the vascular anatomy.  Contrast: OMNIPAQUE IOHEXOL 350 MG/ML SOLN  Comparison: Chest x-ray 10/31/2012; prior chest CT 01/09/2012  Findings:  Mediastinum: Unremarkable CT appearance of the thyroid gland.  No suspicious mediastinal or hilar adenopathy.  No anterior mediastinal mass.  Unremarkable thoracic esophagus.  Heart/Vascular: Adequate evaluation the pulmonary arteries to the segmental level.  New bilateral acute pulmonary emboli.  Filling defects identified in the upper lobar pulmonary artery extending into segmental and subsegmental branches, within the right middle lobe or pulmonary artery, and within the right segmental pulmonary arteries the right segmental pulmonary arterial filling defects are more linear and may represent chronic PE.  On the left, pedis identified within segmental arteries in the left upper lung, within the segmental arteries to the lingula and left lower lobe are segmental arteries.  No evidence of right heart strain.  The heart remains within normal limits for size.  No pericardial effusion.  Lungs/Pleura: The lungs are clear.  No  significant emphysema, bronchial wall thickening, consolidation pneumothorax or pleural effusion.  Trace dependent atelectasis in the left lower lobe.  Upper Abdomen: Unremarkable visualized upper abdomen.  Bones: No acute fracture or aggressive appearing lytic or blastic osseous lesion.  IMPRESSION:  Acute bilateral PE involving all lobes of both lungs.  The overall embolic burden is moderate and extends to the lobar level on the right  and segmental level on the left.  No evidence of right heart strain.  Critical Value/emergent results were called by telephone at the time of interpretation on 11/01/2012 at 01:10 a.m. to Dr. Verl Bangs, who verbally acknowledged these results.   Original Report Authenticated By: Malachy Moan, M.D.       Labs: Basic Metabolic Panel:  Recent Labs Lab 10/31/12 2257 11/01/12 0300  NA 137 138  K 3.8 3.6  CL 105 103  CO2 21 26  GLUCOSE 112* 88  BUN 12 12  CREATININE 1.06 1.11  CALCIUM 9.0 8.9   Liver Function Tests:  Recent Labs Lab 10/31/12 2257  AST 15  ALT 11  ALKPHOS 70  BILITOT 0.7  PROT 7.2  ALBUMIN 3.4*   CBC:  Recent Labs Lab 10/31/12 2257 11/01/12 0300 11/02/12 0450  WBC 8.9 9.5 6.8  NEUTROABS 5.0  --   --   HGB 16.0 15.1 15.7  HCT 44.6 42.9 45.8  MCV 88.7 89.4 91.1  PLT 146* 139* 159   Cardiac Enzymes:  Recent Labs Lab 11/01/12 0300 11/01/12 0800 11/01/12 1743  TROPONINI <0.30 <0.30 <0.30    Principal Problem:   Recurrent pulmonary embolism Active Problems:   Noncompliance   Time coordinating discharge: 35 minutes.  Signed:  [name], MD [facility] 11/03/2012, 4:33 PM

## 2012-11-03 NOTE — Progress Notes (Signed)
[facility] PROGRESS NOTE  Gregory Andrews WUJ:[id number] DOB: [birth year] DOA: 10/31/2012 PCP: [name], MD Hematologist: formerly Dr. Mariel Sleet  Assessment/Plan: 1. Acute recurrent bilateral pulmonary embolism: Secondary to noncompliance with warfarin. Hemodynamically stable. Echocardiogram with normal right ventricular function. EKG sinus rhythm, chronic right bundle branch block compared to study 01/09/2012. Lengthy discussion with patient, offered options including Xarelto, however he does not want to start new agent but prefers to restart Coumadin. Continue Lovenox for a total 5 days or when INR is therapeutic whichever later. 2. Bilateral acute DVT: Continue Lovenox and warfarin. 3. History of recurrent DVT, pulmonary embolism 4. Noncompliance: Discussed with hematology clinic at [facility], patient with long history of noncompliance and will need to seek followup care with an alternate facility/provider.  Outpatient followup has been arranged for 2/24 with Dr. Loleta Chance.    Code Status: Full code Family Communication: Disposition Plan: Home  [name], MD  [facility] Team 6 Pager [phone] If 7PM-7AM, please contact night-coverage at [email or web address], password [facility] 11/03/2012, 3:26 PM  LOS: 3 days   Brief narrative: 52 y.o. male who presents with R sided chest pain, aching in quality, moderate in severity suddenly onset 2 hours ago. Pain has been constant since onset and occurs in the context of history of at least 2 DVT/PEs in the past, he is supposed to be on lifelong coumadin for his recurrent pulmonary emboli and his recurrent embolus in the past occurred while off coumadin. Unfortunatly the patient has not been on coumadin for the past 6 months. In the ED he was found to have new bilateral PEs, hospitalist has been asked to admit.  Consultants:  None  Procedures:  2/19 2-D echocardiogram: Left ventricular ejection fraction 50-55%. Normal wall motion.  Right ventricle with normal systolic function.  2/19 Bilateral lower extremity venous Dopplers: Findings consistent with acute deep vein thrombosis involving the mid to distal femoral, popliteal, and gastrocnemius veins of the right lower extremity. Acute superficial thrombus of the lesser saphenous vein. - Findings consistent with acute deep vein thrombosis involving the proximal popliteal vein of the left lower extremity. - Findings consistent with chronic deep vein thrombosis involving the distal popliteal and proximal posteriortibial veins of the left lower extremity.  HPI/Subjective: No issues. Feels fine. No chest pain or shortness of breath. Some calf pain on the right.  Objective: Filed Vitals:   11/02/12 1410 11/02/12 1945 11/03/12 0513 11/03/12 1421  BP: 129/89 139/89 122/86 135/79  Pulse: 70 69 60 61  Temp: 98.2 F (36.8 C) 98.9 F (37.2 C) 98.4 F (36.9 C) 98.6 F (37 C)  TempSrc: Oral Oral Oral Oral  Resp: 18 18 18 18   Height:      Weight:      SpO2: 100% 99% 100% 100%    Intake/Output Summary (Last 24 hours) at 11/03/12 1526 Last data filed at 11/03/12 1300  Gross per 24 hour  Intake    480 ml  Output    650 ml  Net   -170 ml   Filed Weights   11/01/12 0119 11/01/12 0255  Weight: 90.719 kg (200 lb) 83.2 kg (183 lb 6.8 oz)    Exam:  General:  Appears calm and comfortable Cardiovascular: RRR, no m/r/g. No LE edema. Mild right calf pain with palpation. Telemetry: Sinus rhythm. No arrhythmias. Respiratory: CTA bilaterally, no w/r/r. Normal respiratory effort. Psychiatric: grossly normal mood and affect, speech fluent and appropriate   Data Reviewed: Basic Metabolic Panel:  Recent Labs Lab 10/31/12 2257 11/01/12  0300  NA 137 138  K 3.8 3.6  CL 105 103  CO2 21 26  GLUCOSE 112* 88  BUN 12 12  CREATININE 1.06 1.11  CALCIUM 9.0 8.9   Liver Function Tests:  Recent Labs Lab 10/31/12 2257  AST 15  ALT 11  ALKPHOS 70  BILITOT 0.7  PROT 7.2   ALBUMIN 3.4*   CBC:  Recent Labs Lab 10/31/12 2257 11/01/12 0300 11/02/12 0450  WBC 8.9 9.5 6.8  NEUTROABS 5.0  --   --   HGB 16.0 15.1 15.7  HCT 44.6 42.9 45.8  MCV 88.7 89.4 91.1  PLT 146* 139* 159   Cardiac Enzymes:  Recent Labs Lab 11/01/12 0300 11/01/12 0800 11/01/12 1743  TROPONINI <0.30 <0.30 <0.30     Studies: No results found.  Scheduled Meds: . enoxaparin (LOVENOX) injection  80 mg Subcutaneous Q12H  . sodium chloride  3 mL Intravenous Q12H  . warfarin  15 mg Oral ONCE-1800  . Warfarin - Pharmacist Dosing Inpatient   Does not apply q1800   Continuous Infusions:    Principal Problem:   Recurrent pulmonary embolism Active Problems:   Noncompliance     [name], MD  [facility] Team 6 Pager [phone] If 7PM-7AM, please contact night-coverage at [email or web address], password [facility] 11/03/2012, 3:26 PM  LOS: 3 days

## 2012-11-03 NOTE — Progress Notes (Signed)
Pt/family given discharge instructions, medication lists, follow up appointments, and when to call the doctor.  Pt/family verbalizes understanding. Gregory Andrews    

## 2013-01-25 ENCOUNTER — Other Ambulatory Visit (HOSPITAL_COMMUNITY): Payer: Self-pay | Admitting: Oncology

## 2014-05-27 ENCOUNTER — Emergency Department (HOSPITAL_COMMUNITY)
Admission: EM | Admit: 2014-05-27 | Discharge: 2014-05-27 | Disposition: A | Discharge status: Home / Self Care | Payer: BC Managed Care – PPO | Attending: Emergency Medicine | Admitting: Emergency Medicine

## 2014-05-27 ENCOUNTER — Encounter (HOSPITAL_COMMUNITY): Payer: Self-pay | Admitting: Emergency Medicine

## 2014-05-27 DIAGNOSIS — R509 Fever, unspecified: Secondary | ICD-10-CM | POA: Insufficient documentation

## 2014-05-27 DIAGNOSIS — Z79899 Other long term (current) drug therapy: Secondary | ICD-10-CM | POA: Insufficient documentation

## 2014-05-27 DIAGNOSIS — Z7982 Long term (current) use of aspirin: Secondary | ICD-10-CM | POA: Insufficient documentation

## 2014-05-27 DIAGNOSIS — L02419 Cutaneous abscess of limb, unspecified: Secondary | ICD-10-CM | POA: Insufficient documentation

## 2014-05-27 DIAGNOSIS — L03115 Cellulitis of right lower limb: Secondary | ICD-10-CM

## 2014-05-27 DIAGNOSIS — Z86711 Personal history of pulmonary embolism: Secondary | ICD-10-CM | POA: Insufficient documentation

## 2014-05-27 DIAGNOSIS — L03119 Cellulitis of unspecified part of limb: Principal | ICD-10-CM

## 2014-05-27 DIAGNOSIS — Z87448 Personal history of other diseases of urinary system: Secondary | ICD-10-CM | POA: Insufficient documentation

## 2014-05-27 DIAGNOSIS — F17200 Nicotine dependence, unspecified, uncomplicated: Secondary | ICD-10-CM | POA: Insufficient documentation

## 2014-05-27 DIAGNOSIS — Z86718 Personal history of other venous thrombosis and embolism: Secondary | ICD-10-CM | POA: Insufficient documentation

## 2014-05-27 LAB — CBC WITH DIFFERENTIAL/PLATELET
BASOS ABS: 0 10*3/uL (ref 0.0–0.1)
BASOS PCT: 0 % (ref 0–1)
EOS PCT: 1 % (ref 0–5)
Eosinophils Absolute: 0.1 10*3/uL (ref 0.0–0.7)
HCT: 46.7 % (ref 39.0–52.0)
Hemoglobin: 16.3 g/dL (ref 13.0–17.0)
LYMPHS PCT: 22 % (ref 12–46)
Lymphs Abs: 2.5 10*3/uL (ref 0.7–4.0)
MCH: 31 pg (ref 26.0–34.0)
MCHC: 34.9 g/dL (ref 30.0–36.0)
MCV: 89 fL (ref 78.0–100.0)
MONO ABS: 0.9 10*3/uL (ref 0.1–1.0)
Monocytes Relative: 8 % (ref 3–12)
NEUTROS ABS: 7.8 10*3/uL — AB (ref 1.7–7.7)
Neutrophils Relative %: 69 % (ref 43–77)
PLATELETS: 170 10*3/uL (ref 150–400)
RBC: 5.25 MIL/uL (ref 4.22–5.81)
RDW: 13.2 % (ref 11.5–15.5)
WBC: 11.3 10*3/uL — AB (ref 4.0–10.5)

## 2014-05-27 LAB — COMPREHENSIVE METABOLIC PANEL
ALBUMIN: 3.6 g/dL (ref 3.5–5.2)
ALT: 13 U/L (ref 0–53)
AST: 19 U/L (ref 0–37)
Alkaline Phosphatase: 79 U/L (ref 39–117)
Anion gap: 12 (ref 5–15)
BUN: 11 mg/dL (ref 6–23)
CALCIUM: 9 mg/dL (ref 8.4–10.5)
CHLORIDE: 103 meq/L (ref 96–112)
CO2: 25 meq/L (ref 19–32)
CREATININE: 1.2 mg/dL (ref 0.50–1.35)
GFR calc Af Amer: 79 mL/min — ABNORMAL LOW (ref >90)
GFR, EST NON AFRICAN AMERICAN: 68 mL/min — AB (ref >90)
Glucose, Bld: 79 mg/dL (ref 70–99)
Potassium: 4.4 mEq/L (ref 3.7–5.3)
SODIUM: 140 meq/L (ref 137–147)
Total Bilirubin: 0.3 mg/dL (ref 0.3–1.2)
Total Protein: 7.7 g/dL (ref 6.0–8.3)

## 2014-05-27 LAB — PROTIME-INR
INR: 2.97 — AB (ref 0.00–1.49)
PROTHROMBIN TIME: 30.9 s — AB (ref 11.6–15.2)

## 2014-05-27 MED ORDER — CEPHALEXIN 500 MG PO CAPS: ORAL_CAPSULE | ORAL | Status: DC

## 2014-05-27 MED ORDER — CEPHALEXIN 500 MG PO CAPS
ORAL_CAPSULE | ORAL | Status: AC
  Filled 2014-05-27: qty 2

## 2014-05-27 MED ORDER — OXYCODONE-ACETAMINOPHEN 5-325 MG PO TABS
2.0000 | ORAL_TABLET | Freq: Once | ORAL | Status: AC
  Administered 2014-05-27: 2 via ORAL
  Filled 2014-05-27: qty 2

## 2014-05-27 MED ORDER — OXYCODONE-ACETAMINOPHEN 5-325 MG PO TABS: 2.0000 | ORAL_TABLET | ORAL | Status: DC | PRN

## 2014-05-27 MED ORDER — CEPHALEXIN 500 MG PO CAPS
1000.0000 mg | ORAL_CAPSULE | Freq: Once | ORAL | Status: AC
  Administered 2014-05-27: 1000 mg via ORAL
  Filled 2014-05-27: qty 2

## 2014-05-27 NOTE — ED Notes (Signed)
No answer

## 2014-05-27 NOTE — ED Provider Notes (Signed)
CSN: [id number]     Arrival date & time 05/27/14  1659 History   First MD Initiated Contact with Patient 05/27/14 2020     This chart was scribed for No att. providers found by Tonye Royalty, ED Scribe. This patient was seen in room APA01/AP[facility]01 and the patient's care was started at 8:23 PM.   Chief Complaint  Patient presents with  . Cellulitis   The history is provided by the patient. No language interpreter was used.    HPI Comments: Gregory Andrews is a 53 y.o. male with history of bilateral DVTs in his legs for which he is on Coumadin who presents to the Emergency Department complaining of pain, erythema and swelling to his right calf skin with onset 3 days ago, at which time he felt a sudden bite or sting. He reports associated fever measured at 101 yesterday and chills. He denies confusion, fainting, vomiting, or shortness of breath. He denies distal weakness/numbness.  Past Medical History  Diagnosis Date  . Pulmonary embolism   . Recurrent pulmonary embolism 01/21/2012    On life-long Coumadin  . 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 Noncompliance 01/21/2012    Missed appointments and lab appointments  . Shoulder separation     left  . BPH (benign prostatic hyperplasia)    Past Surgical History  Procedure Laterality Date  . Left kidney removed    . Right knee surgery     Family History  Problem Relation Age of Onset  . Anesthesia problems Neg Hx    History  Substance Use Topics  . Smoking status: Current Every Day Smoker -- 0.75 packs/day    Types: Cigarettes  . Smokeless tobacco: Former Neurosurgeon    Quit date: 01/12/2012  . Alcohol Use: Yes     Comment: beer  1 - 2 week    Review of Systems 10 Systems reviewed and are negative for acute change except as noted in the HPI.   Allergies  Codeine  Home Medications   Prior to Admission medications   Medication Sig Start Date End Date Taking? Authorizing Provider   acetaminophen (TYLENOL) 500 MG tablet Take 1,000 mg by mouth every 6 (six) hours as needed for mild pain or moderate pain.   Yes Historical Provider, MD  warfarin (COUMADIN) 10 MG tablet Take 10 mg by mouth every morning.   Yes Historical Provider, MD  cephALEXin (KEFLEX) 500 MG capsule 2 caps po bid x 7 days 05/27/14   Hurman Horn, MD  cephALEXin (KEFLEX) 500 MG capsule 2 caps po bid x 7 days Dispense to go 05/27/14   Hurman Horn, MD  oxyCODONE-acetaminophen (PERCOCET) 5-325 MG per tablet Take 2 tablets by mouth every 4 (four) hours as needed. 05/27/14   Hurman Horn, MD  oxyCODONE-acetaminophen (PERCOCET) 5-325 MG per tablet Take 2 tablets by mouth every 4 (four) hours as needed. Dispense to go 05/27/14   Hurman Horn, MD   BP 132/80  Pulse 97  Temp(Src) 99.1 F (37.3 C) (Oral)  Resp 20  Ht  (1.778 m)  Wt 210 lb (95.255 kg)  BMI 30.13 kg/m2  SpO2 98% Physical Exam  Nursing note and vitals reviewed. Constitutional:  Awake, alert, nontoxic appearance.  HENT:  Head: Atraumatic.  Tongue normal, oropharynx non-injected  Eyes: Right eye exhibits no discharge. Left eye exhibits no discharge.  Neck: Neck supple.  Cardiovascular: Normal rate, regular rhythm and normal heart sounds.  No murmur heard. Pulmonary/Chest: Effort normal and breath sounds normal. No respiratory distress. He has no wheezes. He has no rales. He exhibits no tenderness.  Abdominal: Soft. There is no tenderness. There is no rebound.  Musculoskeletal: He exhibits no tenderness.  Baseline ROM, no obvious new focal weakness.Right thigh nontender. Right foot DP pulse intact, cap refill less than 2 seconds, PT pulse intact. normal light touch. good foot movement. right lower leg posterior aspect has diffuse erythema, induration, warmth, and tenderness consistent with localized cellulitis. POCUS no apparent fluid collection suggesting abscess   Neurological:  Mental status and motor strength appears baseline for  patient and situation.  Skin: No rash noted.  Psychiatric: He has a normal mood and affect.    ED Course  Procedures (including critical care time) Labs Review Labs Reviewed  CBC WITH DIFFERENTIAL - Abnormal; Notable for the following:    WBC 11.3 (*)    Neutro Abs 7.8 (*)    All other components within normal limits  COMPREHENSIVE METABOLIC PANEL - Abnormal; Notable for the following:    GFR calc non Af Amer 68 (*)    GFR calc Af Amer 79 (*)    All other components within normal limits  PROTIME-INR - Abnormal; Notable for the following:    Prothrombin Time 30.9 (*)    INR 2.97 (*)    All other components within normal limits    Imaging Review No results found.   EKG Interpretation None     Patient / Family / Caregiver informed of clinical course, understand medical decision-making process, and agree with plan.  MDM   Final diagnoses:  Cellulitis of right leg   I doubt any other EMC precluding discharge at this time including, but not necessarily limited to the following:compartment syndrome, abscess, nec fasc.  I personally performed the services described in this documentation, which was scribed in my presence. The recorded information has been reviewed and is accurate.    Hurman Horn, MD 05/29/14 [phone]

## 2014-05-27 NOTE — ED Notes (Signed)
Rt lower leg,red , swollen , thinks insect bit him on post calf

## 2014-05-27 NOTE — ED Notes (Signed)
Pt is concerned pharmacy will not be open additional dose of keflex will be given to pt for am per dr. Fonnie Jarvis.

## 2014-05-31 NOTE — ED Notes (Signed)
Pt back today requesting work note from 9/14-9/24.  States occupational RN will not allow pt to return to work without note with those dates because she will not be in to clear pt until that time and pt cannot work without being cleared by Chief Financial Officer.  Spoke with Dr. Adriana Simas regarding situation, and per DR, Adriana Simas, request granted for work note through 9/24.

## 2016-02-02 DIAGNOSIS — Z7901 Long term (current) use of anticoagulants: Secondary | ICD-10-CM | POA: Diagnosis not present

## 2016-03-04 DIAGNOSIS — R233 Spontaneous ecchymoses: Secondary | ICD-10-CM | POA: Diagnosis not present

## 2016-03-04 DIAGNOSIS — Z7901 Long term (current) use of anticoagulants: Secondary | ICD-10-CM | POA: Diagnosis not present

## 2016-07-28 DIAGNOSIS — Z0000 Encounter for general adult medical examination without abnormal findings: Secondary | ICD-10-CM | POA: Diagnosis not present

## 2016-07-28 DIAGNOSIS — Z6829 Body mass index (BMI) 29.0-29.9, adult: Secondary | ICD-10-CM | POA: Diagnosis not present

## 2016-07-28 DIAGNOSIS — K429 Umbilical hernia without obstruction or gangrene: Secondary | ICD-10-CM | POA: Diagnosis not present

## 2016-07-28 DIAGNOSIS — Z7901 Long term (current) use of anticoagulants: Secondary | ICD-10-CM | POA: Diagnosis not present

## 2016-10-04 ENCOUNTER — Emergency Department (HOSPITAL_COMMUNITY)
Admission: EM | Admit: 2016-10-04 | Discharge: 2016-10-04 | Disposition: A | Discharge status: Home / Self Care | Payer: BLUE CROSS/BLUE SHIELD | Attending: Emergency Medicine | Admitting: Emergency Medicine

## 2016-10-04 ENCOUNTER — Emergency Department (HOSPITAL_COMMUNITY): Payer: BLUE CROSS/BLUE SHIELD

## 2016-10-04 ENCOUNTER — Encounter (HOSPITAL_COMMUNITY): Payer: Self-pay | Admitting: Emergency Medicine

## 2016-10-04 DIAGNOSIS — Z7901 Long term (current) use of anticoagulants: Secondary | ICD-10-CM | POA: Diagnosis not present

## 2016-10-04 DIAGNOSIS — R310 Gross hematuria: Secondary | ICD-10-CM | POA: Diagnosis not present

## 2016-10-04 DIAGNOSIS — Z87891 Personal history of nicotine dependence: Secondary | ICD-10-CM | POA: Insufficient documentation

## 2016-10-04 DIAGNOSIS — N3001 Acute cystitis with hematuria: Secondary | ICD-10-CM | POA: Insufficient documentation

## 2016-10-04 DIAGNOSIS — R791 Abnormal coagulation profile: Secondary | ICD-10-CM | POA: Diagnosis not present

## 2016-10-04 DIAGNOSIS — R319 Hematuria, unspecified: Secondary | ICD-10-CM | POA: Diagnosis present

## 2016-10-04 LAB — BASIC METABOLIC PANEL
ANION GAP: 7 (ref 5–15)
BUN: 11 mg/dL (ref 6–20)
CO2: 25 mmol/L (ref 22–32)
CREATININE: 1.15 mg/dL (ref 0.61–1.24)
Calcium: 8.6 mg/dL — ABNORMAL LOW (ref 8.9–10.3)
Chloride: 105 mmol/L (ref 101–111)
GFR calc Af Amer: >60 mL/min (ref >60)
Glucose, Bld: 112 mg/dL — ABNORMAL HIGH (ref 65–99)
Potassium: 3.4 mmol/L — ABNORMAL LOW (ref 3.5–5.1)
SODIUM: 137 mmol/L (ref 135–145)

## 2016-10-04 LAB — URINALYSIS, MICROSCOPIC (REFLEX): Squamous Epithelial / LPF: NONE SEEN

## 2016-10-04 LAB — CBC WITH DIFFERENTIAL/PLATELET
Basophils Absolute: 0.1 10*3/uL (ref 0.0–0.1)
Basophils Relative: 1 %
EOS ABS: 0.1 10*3/uL (ref 0.0–0.7)
Eosinophils Relative: 2 %
HEMATOCRIT: 47.2 % (ref 39.0–52.0)
Hemoglobin: 16.2 g/dL (ref 13.0–17.0)
Lymphocytes Relative: 55 %
Lymphs Abs: 3.4 10*3/uL (ref 0.7–4.0)
MCH: 30.8 pg (ref 26.0–34.0)
MCHC: 34.3 g/dL (ref 30.0–36.0)
MCV: 89.7 fL (ref 78.0–100.0)
Monocytes Absolute: 0.6 10*3/uL (ref 0.1–1.0)
Monocytes Relative: 10 %
NEUTROS ABS: 2 10*3/uL (ref 1.7–7.7)
NEUTROS PCT: 32 %
Platelets: 180 10*3/uL (ref 150–400)
RBC: 5.26 MIL/uL (ref 4.22–5.81)
RDW: 13.1 % (ref 11.5–15.5)
WBC: 6.3 10*3/uL (ref 4.0–10.5)

## 2016-10-04 LAB — URINALYSIS, ROUTINE W REFLEX MICROSCOPIC
Bilirubin Urine: NEGATIVE
Glucose, UA: 100 mg/dL — AB
Ketones, ur: 15 mg/dL — AB
Nitrite: POSITIVE — AB
SPECIFIC GRAVITY, URINE: 1.025 (ref 1.005–1.030)
pH: 6.5 (ref 5.0–8.0)

## 2016-10-04 LAB — PROTIME-INR
INR: 5.03
PROTHROMBIN TIME: 48.1 s — AB (ref 11.4–15.2)

## 2016-10-04 MED ORDER — DEXTROSE 5 % IV SOLN
1.0000 g | Freq: Once | INTRAVENOUS | Status: AC
  Administered 2016-10-04: 1 g via INTRAVENOUS
  Filled 2016-10-04: qty 10

## 2016-10-04 MED ORDER — CEPHALEXIN 500 MG PO CAPS: 500.0000 mg | ORAL_CAPSULE | Freq: Three times a day (TID) | ORAL | 0 refills | Status: DC

## 2016-10-04 NOTE — ED Provider Notes (Signed)
[facility]-EMERGENCY DEPT Provider Note   CSN: [id number] Arrival date & time: 10/04/16  0736     History   Chief Complaint Chief Complaint  Patient presents with  . Hematuria    HPI Gregory Andrews is a 56 y.o. male.  Gregory Andrews is a 56 y.o. 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  Patient reports he woke up this morning and his urine looked brown and dark red. He denies any dysuria, urinary frequency or urinary urgency. He denies any abdominal pain. He is on Coumadin for previous PE and DVT. Last INR check was in November. 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 Patient denies fevers, abdominal pain, nausea, vomiting, diarrhea, dysuria, urinary frequency, urinary urgency, abdominal trauma, trouble urinating, back pain, easy bruising or bleeding gums.   The history is provided by the patient. No language interpreter was used.  Hematuria  Pertinent negatives include no chest pain, no abdominal pain, no headaches and no shortness of breath.    Past Medical History:  Diagnosis Date  . BPH (benign prostatic hyperplasia)   . Noncompliance 01/21/2012   Missed appointments and lab appointments  . Pulmonary embolism (HCC)   . 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 Recurrent pulmonary embolism 01/21/2012   On life-long Coumadin  . Shoulder separation    left    Patient Active Problem List   Diagnosis Date Noted  . Recurrent pulmonary embolism 01/21/2012  . 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 Noncompliance 01/21/2012    Past Surgical History:  Procedure Laterality Date  . left kidney removed    . right knee surgery         Home Medications    Prior to Admission medications   Medication Sig Start Date End Date  Taking? Authorizing Provider  guaiFENesin (MUCINEX) 600 MG 12 hr tablet Take 600 mg by mouth 2 (two) times daily.   Yes Historical Provider, MD  warfarin (COUMADIN) 10 MG tablet Take 10 mg by mouth every morning. Except on Mondays   Yes Historical Provider, MD  cephALEXin (KEFLEX) 500 MG capsule Take 1 capsule (500 mg total) by mouth 3 (three) times daily. 10/04/16   Everlene FarrierWilliam Trenae Brunke, PA-C    Family History Family History  Problem Relation Age of Onset  . Anesthesia problems Neg Hx     Social History Social History  Substance Use Topics  . Smoking status: Former Smoker    Types: Cigarettes  . Smokeless tobacco: Former NeurosurgeonUser    Quit date: 01/12/2012  . Alcohol use Yes     Comment: beer  1 - 2 week     Allergies   Codeine   Review of Systems Review of Systems  Constitutional: Negative for chills and fever.  HENT: Positive for rhinorrhea. Negative for nosebleeds and sore throat.   Eyes: Negative for visual disturbance.  Respiratory: Negative for cough, shortness of breath and wheezing.   Cardiovascular: Negative for chest pain and palpitations.  Gastrointestinal: Negative for abdominal pain, diarrhea, nausea and vomiting.  Genitourinary: Positive for hematuria. Negative for difficulty urinating, dysuria, flank pain, frequency and urgency.  Musculoskeletal: Negative for back pain and neck pain.  Skin: Negative for rash.  Neurological: Negative for syncope, light-headedness and headaches.     Physical Exam  Updated Vital Signs BP 139/92   Pulse 72   Temp 98 F (36.7 C) (Oral)   Resp 16   Ht 5\' 10"  (1.778 m)   Wt 95.3 kg   SpO2 97%   BMI 30.13 kg/m   Physical Exam  Constitutional: He appears well-developed and well-nourished. No distress.  Nontoxic appearing.  HENT:  Head: Normocephalic and atraumatic.  Mouth/Throat: Oropharynx is clear and moist.  Eyes: Conjunctivae are normal. Pupils are equal, round, and reactive to light. Right eye exhibits no discharge. Left eye  exhibits no discharge.  Neck: Normal range of motion. Neck supple.  Cardiovascular: Normal rate, regular rhythm, normal heart sounds and intact distal pulses.  Exam reveals no gallop and no friction rub.   No murmur heard. Pulmonary/Chest: Effort normal and breath sounds normal. No respiratory distress. He has no wheezes. He has no rales.  Abdominal: Soft. He exhibits no distension and no mass. There is no tenderness. There is no guarding.  Abdomen is soft and nontender to palpation. Bowel sounds are present. No CVA or flank tenderness.  Musculoskeletal: He exhibits no edema.  Lymphadenopathy:    He has no cervical adenopathy.  Neurological: He is alert. Coordination normal.  Skin: Skin is warm and dry. Capillary refill takes less than 2 seconds. No rash noted. He is not diaphoretic. No erythema. No pallor.  Psychiatric: He has a normal mood and affect. His behavior is normal.  Nursing note and vitals reviewed.    ED Treatments / Results  Labs (all labs ordered are listed, but only abnormal results are displayed) Labs Reviewed  URINALYSIS, ROUTINE W REFLEX MICROSCOPIC - Abnormal; Notable for the following:       Result Value   Color, Urine BROWN (*)    APPearance TURBID (*)    Glucose, UA 100 (*)    Hgb urine dipstick LARGE (*)    Ketones, ur 15 (*)    Protein, ur >300 (*)    Nitrite POSITIVE (*)    Leukocytes, UA MODERATE (*)    All other components within normal limits  PROTIME-INR - Abnormal; Notable for the following:    Prothrombin Time 48.1 (*)    INR 5.03 (*)    All other components within normal limits  BASIC METABOLIC PANEL - Abnormal; Notable for the following:    Potassium 3.4 (*)    Glucose, Bld 112 (*)    Calcium 8.6 (*)    All other components within normal limits  URINALYSIS, MICROSCOPIC (REFLEX) - Abnormal; Notable for the following:    Bacteria, UA MANY (*)    All other components within normal limits  URINE CULTURE  CBC WITH DIFFERENTIAL/PLATELET     EKG  EKG Interpretation None       Radiology Ct Renal Stone Study  Result Date: 10/04/2016 CLINICAL DATA:  Gross hematuria. Taking Coumadin. Previous left nephrectomy. EXAM: CT ABDOMEN AND PELVIS WITHOUT CONTRAST TECHNIQUE: Multidetector CT imaging of the abdomen and pelvis was performed following the standard protocol without IV contrast. COMPARISON:  10/06/2012. 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 Associated left lower lobe atelectasis. Hepatobiliary: Previously demonstrated small cyst in the lateral segment of the left lobe of the liver. Unremarkable gallbladder. No gallstones, gallbladder wall thickening, or biliary dilatation. Pancreas: Unremarkable. No pancreatic ductal dilatation or surrounding inflammatory changes. Spleen: Normal in size without focal abnormality. Adrenals/Urinary Tract: Surgically absent left kidney. Mild compensatory hypertrophy of the right kidney. No urinary tract calculi or hydronephrosis. Normal  appearing urinary bladder and right ureter. Normal appearing adrenal glands. Stomach/Bowel: Stomach is within normal limits. Appendix appears normal. No evidence of bowel wall thickening, distention, or inflammatory changes. Vascular/Lymphatic: Minimal aortic calcification. No enlarged lymph nodes. Reproductive: Moderately enlarged prostate gland. Other: Small to moderate-sized umbilical hernia containing fat. Small right inguinal hernia containing fat and small to moderate-sized left inguinal hernia containing fat. Musculoskeletal: Mild lumbar and lower thoracic spine degenerative changes. IMPRESSION: 1. No acute abnormality. 2. Status post left nephrectomy with mild compensatory hypertrophy of the right kidney. 3. 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 4. Moderately enlarged prostate gland protruding into the base of the urinary bladder. 5. Umbilical and bilateral inguinal hernias containing fat.  6. Minimal aortic atherosclerosis. Electronically Signed   By: Beckie Salts M.D.   On: 10/04/2016 11:28    Procedures Procedures (including critical care time)  Medications Ordered in ED Medications  cefTRIAXone (ROCEPHIN) 1 g in dextrose 5 % 50 mL IVPB (0 g Intravenous Stopped 10/04/16 1056)     Initial Impression / Assessment and Plan / ED Course  I have reviewed the triage vital signs and the nursing notes.  Pertinent labs & imaging results that were available during my care of the patient were reviewed by me and considered in my medical decision making (see chart for details).     Patient on Coumadin presents to the emergency room with painless hematuria since this morning. On examination the patient is afebrile nontoxic appearing. His abdomen is soft and nontender to palpation. CBC is unremarkable. Hemoglobin of 16.2. BMP is unremarkable. INR is elevated at 5.03. Urinalysis is nitrite positive with moderate leukocytes and large hemoglobin. Too numerous to count white blood cells. Urine sent for culture. CT renal stone study showed no acute abnormality. Patient likely with urinary tract infection with hematuria. He received a gram of Rocephin in the emergency department. We'll discharge with Keflex and have him follow up closely with primary care and urology. I advised him to hold his Coumadin for 2 days and follow-up this week for INR recheck. Patient agrees with this plan. I discussed strict and specific return precautions. I advised the patient to follow-up with their primary care provider this week. I advised the patient to return to the emergency department with new or worsening symptoms or new concerns. The patient verbalized understanding and agreement with plan.    This patient was discussed with Dr. Judd Lien who agrees with assessment and plan.  Final Clinical Impressions(s) / ED Diagnoses   Final diagnoses:  Acute cystitis with hematuria  Elevated INR    New  Prescriptions New Prescriptions   CEPHALEXIN (KEFLEX) 500 MG CAPSULE    Take 1 capsule (500 mg total) by mouth 3 (three) times daily.     Everlene Farrier, PA-C 10/04/16 1210    Geoffery Lyons, MD 10/04/16 [phone]

## 2016-10-04 NOTE — ED Notes (Signed)
Pt stated understanding of d/c instructions, follow up care, prescriptions, and how to take medication properly. No further questions at this time. Ambulatory to d/c.

## 2016-10-04 NOTE — ED Triage Notes (Signed)
Pt states he woke up this morning urinating blood.  Pt does take Coumadin for hx of dvt/pe.  Denies pain.

## 2016-10-04 NOTE — ED Notes (Signed)
Lab at bedside

## 2016-10-04 NOTE — ED Notes (Signed)
Pt denies any pain or urinary symptoms. Last had PT checked in November.

## 2016-10-04 NOTE — Discharge Instructions (Signed)
Please hold your coumadin for two days and follow up this week with your PCP for INR recheck.

## 2016-10-04 NOTE — ED Notes (Signed)
ED Provider at bedside. 

## 2016-10-04 NOTE — ED Notes (Signed)
CRITICAL VALUE ALERT  Critical value received:  INR 5.03  Date of notification:  10/04/16  Time of notification:  0949  Critical value read back:Yes.    Nurse who received alert:  Berdine DanceMandi Chrisa Hassan RN  MD notified (1st page):  Arline Aspanzie PA  Time of first page:  [phone]  MD notified (2nd page):  Time of second page:  Responding MD:  Arline Aspanzie PA  Time MD responded:  [phone]

## 2016-10-04 NOTE — ED Notes (Signed)
Pt unable to give urine specimen at this time 

## 2016-10-06 DIAGNOSIS — Z7901 Long term (current) use of anticoagulants: Secondary | ICD-10-CM | POA: Diagnosis not present

## 2016-10-06 DIAGNOSIS — R310 Gross hematuria: Secondary | ICD-10-CM | POA: Diagnosis not present

## 2016-10-06 DIAGNOSIS — J069 Acute upper respiratory infection, unspecified: Secondary | ICD-10-CM | POA: Diagnosis not present

## 2016-10-06 LAB — URINE CULTURE: CULTURE: NO GROWTH

## 2016-10-13 DIAGNOSIS — Z7901 Long term (current) use of anticoagulants: Secondary | ICD-10-CM | POA: Diagnosis not present

## 2016-10-13 DIAGNOSIS — R310 Gross hematuria: Secondary | ICD-10-CM | POA: Diagnosis not present

## 2017-06-08 ENCOUNTER — Encounter (HOSPITAL_COMMUNITY): Payer: Self-pay | Admitting: Emergency Medicine

## 2017-06-08 ENCOUNTER — Emergency Department (HOSPITAL_COMMUNITY)
Admission: EM | Admit: 2017-06-08 | Discharge: 2017-06-08 | Disposition: A | Discharge status: Home / Self Care | Payer: BLUE CROSS/BLUE SHIELD | Attending: Emergency Medicine | Admitting: Emergency Medicine

## 2017-06-08 ENCOUNTER — Emergency Department (HOSPITAL_COMMUNITY): Payer: BLUE CROSS/BLUE SHIELD

## 2017-06-08 DIAGNOSIS — R1033 Periumbilical pain: Secondary | ICD-10-CM | POA: Insufficient documentation

## 2017-06-08 DIAGNOSIS — M545 Low back pain, unspecified: Secondary | ICD-10-CM

## 2017-06-08 DIAGNOSIS — R109 Unspecified abdominal pain: Secondary | ICD-10-CM | POA: Diagnosis not present

## 2017-06-08 DIAGNOSIS — Z7901 Long term (current) use of anticoagulants: Secondary | ICD-10-CM | POA: Diagnosis not present

## 2017-06-08 DIAGNOSIS — Z79899 Other long term (current) drug therapy: Secondary | ICD-10-CM | POA: Diagnosis not present

## 2017-06-08 DIAGNOSIS — F17210 Nicotine dependence, cigarettes, uncomplicated: Secondary | ICD-10-CM | POA: Diagnosis not present

## 2017-06-08 LAB — COMPREHENSIVE METABOLIC PANEL
ALK PHOS: 52 U/L (ref 38–126)
ALT: 16 U/L — ABNORMAL LOW (ref 17–63)
AST: 19 U/L (ref 15–41)
Albumin: 3.8 g/dL (ref 3.5–5.0)
Anion gap: 4 — ABNORMAL LOW (ref 5–15)
BUN: 17 mg/dL (ref 6–20)
CHLORIDE: 107 mmol/L (ref 101–111)
CO2: 25 mmol/L (ref 22–32)
Calcium: 8.9 mg/dL (ref 8.9–10.3)
Creatinine, Ser: 1.23 mg/dL (ref 0.61–1.24)
GFR calc Af Amer: >60 mL/min (ref >60)
GFR calc non Af Amer: >60 mL/min (ref >60)
Glucose, Bld: 184 mg/dL — ABNORMAL HIGH (ref 65–99)
Potassium: 3.7 mmol/L (ref 3.5–5.1)
Sodium: 136 mmol/L (ref 135–145)
Total Bilirubin: 0.6 mg/dL (ref 0.3–1.2)
Total Protein: 7 g/dL (ref 6.5–8.1)

## 2017-06-08 LAB — CBC WITH DIFFERENTIAL/PLATELET
BASOS ABS: 0 10*3/uL (ref 0.0–0.1)
Basophils Relative: 0 %
EOS PCT: 2 %
Eosinophils Absolute: 0.1 10*3/uL (ref 0.0–0.7)
HCT: 44.7 % (ref 39.0–52.0)
HEMOGLOBIN: 15.5 g/dL (ref 13.0–17.0)
LYMPHS PCT: 50 %
Lymphs Abs: 2.5 10*3/uL (ref 0.7–4.0)
MCH: 31.1 pg (ref 26.0–34.0)
MCHC: 34.7 g/dL (ref 30.0–36.0)
MCV: 89.8 fL (ref 78.0–100.0)
Monocytes Absolute: 0.5 10*3/uL (ref 0.1–1.0)
Monocytes Relative: 10 %
NEUTROS ABS: 1.9 10*3/uL (ref 1.7–7.7)
NEUTROS PCT: 38 %
PLATELETS: 183 10*3/uL (ref 150–400)
RBC: 4.98 MIL/uL (ref 4.22–5.81)
RDW: 13 % (ref 11.5–15.5)
WBC: 5 10*3/uL (ref 4.0–10.5)

## 2017-06-08 LAB — URINALYSIS, ROUTINE W REFLEX MICROSCOPIC
BILIRUBIN URINE: NEGATIVE
Glucose, UA: NEGATIVE mg/dL
KETONES UR: NEGATIVE mg/dL
Leukocytes, UA: NEGATIVE
Nitrite: NEGATIVE
Protein, ur: NEGATIVE mg/dL
SQUAMOUS EPITHELIAL / LPF: NONE SEEN
Specific Gravity, Urine: 1.021 (ref 1.005–1.030)
pH: 5 (ref 5.0–8.0)

## 2017-06-08 LAB — PROTIME-INR
INR: 1.42
PROTHROMBIN TIME: 17.2 s — AB (ref 11.4–15.2)

## 2017-06-08 LAB — LIPASE, BLOOD: Lipase: 41 U/L (ref 11–51)

## 2017-06-08 MED ORDER — ONDANSETRON HCL 4 MG/2ML IJ SOLN
4.0000 mg | Freq: Once | INTRAMUSCULAR | Status: AC
  Administered 2017-06-08: 4 mg via INTRAVENOUS
  Filled 2017-06-08: qty 2

## 2017-06-08 MED ORDER — IOPAMIDOL (ISOVUE-370) INJECTION 76%
100.0000 mL | Freq: Once | INTRAVENOUS | Status: AC | PRN
  Administered 2017-06-08: 100 mL via INTRAVENOUS

## 2017-06-08 MED ORDER — CYCLOBENZAPRINE HCL 5 MG PO TABS: 5.0000 mg | ORAL_TABLET | Freq: Three times a day (TID) | ORAL | 0 refills | Status: DC | PRN

## 2017-06-08 MED ORDER — ACETAMINOPHEN 500 MG PO TABS: 500.0000 mg | ORAL_TABLET | Freq: Four times a day (QID) | ORAL | 0 refills | Status: DC | PRN

## 2017-06-08 MED ORDER — MORPHINE SULFATE (PF) 4 MG/ML IV SOLN
4.0000 mg | Freq: Once | INTRAVENOUS | Status: AC
  Administered 2017-06-08: 4 mg via INTRAVENOUS
  Filled 2017-06-08: qty 1

## 2017-06-08 MED ORDER — CYCLOBENZAPRINE HCL 10 MG PO TABS
5.0000 mg | ORAL_TABLET | Freq: Once | ORAL | Status: AC
Start: 2017-06-08 — End: 2017-06-08
  Administered 2017-06-08: 5 mg via ORAL
  Filled 2017-06-08: qty 1

## 2017-06-08 NOTE — ED Triage Notes (Signed)
Pt c/o lower back pain that radiates to [employer] since Thursday.

## 2017-06-08 NOTE — ED Provider Notes (Signed)
[facility]Y DEPT Provider Note   CSN: [id number] Arrival date & time: 06/08/17  0316     History   Chief Complaint Chief Complaint  Patient presents with  . Back Pain    HPI Gregory Andrews is a 56 y.o. male.  HPI  This is a 56 year old male with a history of PE, recurrent DVT on Coumadinwho presents with back pain and abdominal pain. Onset of symptoms on Thursday. He reports lower back discomfort.  Sometimes it is worse with movement. Denies injury or heavy lifting. He states that the pain radiates around to the umbilicus. Denies nausea, vomiting, diarrhea, constipation. He does have history of umbilical hernia.Denies fevers. Denies weakness, numbness, tingling in the lower extremities, bowel or bladder difficulties. Currently he rates his pain at 8 out of 10. He took 4 ibuprofen before he went to work with minimal relief.  Past Medical History:  Diagnosis Date  . BPH (benign prostatic hyperplasia)   . Noncompliance 01/21/2012   Missed appointments and lab appointments  . Pulmonary embolism (HCC)   . 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 Recurrent pulmonary embolism 01/21/2012   On life-long Coumadin  . Shoulder separation    left    Patient Active Problem List   Diagnosis Date Noted  . Recurrent pulmonary embolism 01/21/2012  . 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 Noncompliance 01/21/2012    Past Surgical History:  Procedure Laterality Date  . left kidney removed    . right knee surgery         Home Medications    Prior to Admission medications   Medication Sig Start Date End Date Taking? Authorizing Provider  acetaminophen (TYLENOL) 500 MG tablet Take 1 tablet (500 mg total) by mouth every 6 (six) hours as needed. 06/08/17   Blaize Epple, Mayer Masker, MD  cephALEXin (KEFLEX) 500 MG capsule Take 1 capsule (500 mg total) by mouth 3 (three) times daily. 10/04/16   Everlene Farrier, PA-C  cyclobenzaprine  (FLEXERIL) 5 MG tablet Take 1 tablet (5 mg total) by mouth 3 (three) times daily as needed for muscle spasms. 06/08/17   Kizzie Cotten, Mayer Masker, MD  guaiFENesin (MUCINEX) 600 MG 12 hr tablet Take 600 mg by mouth 2 (two) times daily.    [provider]  warfarin (COUMADIN) 10 MG tablet Take 10 mg by mouth every morning. Except on Mondays    [provider]    Family History Family History  Problem Relation Age of Onset  . Anesthesia problems Neg Hx     Social History Social History  Substance Use Topics  . Smoking status: Current Every Day Smoker    Types: Cigarettes  . Smokeless tobacco: Former Neurosurgeon    Quit date: 01/12/2012  . Alcohol use Yes     Comment: beer  1 - 2 week     Allergies   Codeine   Review of Systems Review of Systems  Constitutional: Negative for fever.  Respiratory: Negative for shortness of breath.   Cardiovascular: Negative for chest pain.  Gastrointestinal: Positive for abdominal pain. Negative for constipation, diarrhea, nausea and vomiting.  Genitourinary: Negative for dysuria and hematuria.  Musculoskeletal: Positive for back pain.  All other systems reviewed and are negative.    Physical Exam Updated Vital Signs BP 135/89   Pulse 91   Temp 98.5 F (36.9 C) (Oral)   Resp 20   Ht  (1.778 m)   Wt 93 kg (205 lb)  SpO2 100%   BMI 29.41 kg/m   Physical Exam  Constitutional: He is oriented to person, place, and time. He appears well-developed and well-nourished. No distress.  HENT:  Head: Normocephalic and atraumatic.  Cardiovascular: Normal rate, regular rhythm and normal heart sounds.   No murmur heard. Pulmonary/Chest: Effort normal and breath sounds normal. No respiratory distress. He has no wheezes.  Abdominal: Soft. Bowel sounds are normal. He exhibits no distension. There is tenderness. There is no rebound and no guarding. A hernia is present.  Easily reducible umbilical hernia, mild tenderness to palpation  diffusely  Musculoskeletal: He exhibits no edema.  No obvious deformities, no midline lumbar spine tenderness to palpation  Neurological: He is alert and oriented to person, place, and time.  5 out of 5 strength bilateral lower extremities, normal reflexes  Skin: Skin is warm and dry.  Psychiatric: He has a normal mood and affect.  Nursing note and vitals reviewed.    ED Treatments / Results  Labs (all labs ordered are listed, but only abnormal results are displayed) Labs Reviewed  COMPREHENSIVE METABOLIC PANEL - Abnormal; Notable for the following:       Result Value   Glucose, Bld 184 (*)    ALT 16 (*)    Anion gap 4 (*)    All other components within normal limits  URINALYSIS, ROUTINE W REFLEX MICROSCOPIC - Abnormal; Notable for the following:    Hgb urine dipstick SMALL (*)    Bacteria, UA RARE (*)    All other components within normal limits  PROTIME-INR - Abnormal; Notable for the following:    Prothrombin Time 17.2 (*)    All other components within normal limits  CBC WITH DIFFERENTIAL/PLATELET  LIPASE, BLOOD    EKG  EKG Interpretation None       Radiology Ct Angio Abd/pel W And/or Wo Contrast  Result Date: 06/08/2017 CLINICAL DATA:  Abdominal and back pain.  Question aortic aneurysm. EXAM: CTA ABDOMEN AND PELVIS wITHOUT AND WITH CONTRAST TECHNIQUE: Multidetector CT imaging of the abdomen and pelvis was performed using the standard protocol during bolus administration of intravenous contrast. Multiplanar reconstructed images and MIPs were obtained and reviewed to evaluate the vascular anatomy. CONTRAST:  100 cc Isovue 370 IV COMPARISON:  Noncontrast CT 10/04/2016 FINDINGS: VASCULAR Aorta: Normal caliber aorta without aneurysm, dissection, vasculitis or significant stenosis. Minimal atherosclerosis. Celiac: Patent without evidence of aneurysm, dissection, vasculitis or significant stenosis. SMA: Patent without evidence of aneurysm, dissection, vasculitis or significant  stenosis. Renals: The right renal artery is patent without evidence of aneurysm, dissection, vasculitis, fibromuscular dysplasia or significant stenosis. Post left nephrectomy. IMA: Patent without evidence of aneurysm, dissection, vasculitis or significant stenosis. Inflow: Patent without evidence of aneurysm, dissection, vasculitis or significant stenosis. Proximal Outflow: Bilateral common femoral and visualized portions of the superficial and profunda femoral arteries are patent without evidence of aneurysm, dissection, vasculitis or significant stenosis. Veins: Questionable linear filling defects in the main portal vein just distal to the porta splenic confluence. Heterogeneous contrast within the IVC is likely secondary to contrast mixing. Review of the MIP images confirms the above findings. NON-VASCULAR Lower chest: Elevated left hemidiaphragm with adjacent atelectasis in the left lower lobe. Minimal dependent right lower lobe atelectasis. Hepatobiliary: 9 mm hypodensity in the left lobe of the liver has likely small hemangioma, unchanged from prior exam. No new focal lesion. Gallbladder physiologically distended, no calcified stone. No biliary dilatation. Pancreas: No ductal dilatation or inflammation. Spleen: Normal arterial phase enhancement with heterogeneity, normal  in size. Adrenals/Urinary Tract: Post left nephrectomy. No abnormal soft tissue density in the nephrectomy bed. Normal adrenal glands. No right hydronephrosis. No focal renal lesion. No perinephric edema. Urinary bladder is minimally distended without wall thickening. Stomach/Bowel: Stomach is within normal limits. Appendix appears normal. No evidence of bowel wall thickening, distention, or inflammatory changes. Moderate colonic stool burden. Lymphatic: No abdominal or pelvic adenopathy. Reproductive: Heterogeneous enlarged prostate gland spans 6.3 cm. Other: Small fat containing umbilical hernia. Fat within both inguinal canals. Fluid in  the left inguinal canal. No ascites or free air. Musculoskeletal: There are no acute or suspicious osseous abnormalities. Chronic avascular necrosis of both femoral heads without subchondral collapse. IMPRESSION: VASCULAR 1. Normal caliber aorta with trace atherosclerosis. No aneurysm or acute aortic abnormality. 2. Low-density within the portal vein just beyond the portal splenic confluence, likely contrast mixing, difficult to exclude nonocclusive portal vein thrombosis. Probable contrast mixing in the IVC. If there is clinical concern for portal vein thrombosis, Doppler evaluation of the liver could be considered. Patient has reported history of recurrent bilateral lower extremity DVT, venous ultrasound of the lower extremities could be considered based on clinical concern. Please note venous structures are suboptimally assessed on this arterial phase exam. NON-VASCULAR 1. No acute abnormality. 2. Post left nephrectomy. 3. Enlarged prostate gland.  Small fat containing umbilical hernia. 4. Incidental note of avascular necrosis of bilateral femoral heads, without subchondral collapse. Electronically Signed   By: Rubye Oaks M.D.   On: 06/08/2017 06:09    Procedures Procedures (including critical care time)  Medications Ordered in ED Medications  morphine 4 MG/ML injection 4 mg (4 mg Intravenous Given 06/08/17 0345)  ondansetron (ZOFRAN) injection 4 mg (4 mg Intravenous Given 06/08/17 0345)  iopamidol (ISOVUE-370) 76 % injection 100 mL (100 mLs Intravenous Contrast Given 06/08/17 0512)  cyclobenzaprine (FLEXERIL) tablet 5 mg (5 mg Oral Given 06/08/17 0641)     Initial Impression / Assessment and Plan / ED Course  I have reviewed the triage vital signs and the nursing notes.  Pertinent labs & imaging results that were available during my care of the patient were reviewed by me and considered in my medical decision making (see chart for details).     Patient presents with back and abdominal  pain. Ongoing and worsening since last week. Nontoxic. Vital signs reassuring. No other additional abdominal complaints. He is neurologically intact. No signs or symptoms of cauda equina. There are some elements of the history to suggest musculoskeletal etiology; however, the radiation to the abdomen is concerning and could indicate intra-abdominal pathology including AAA. Lab work was obtained and is largely reassuring. CT scan obtained which shows no evidence of AAA. There is some question of whether he has venous clot in the liver. Patient reports compliance with his Coumadin. INR added to lab work. After pain medication, he does report some improvement of his pain. Will add Flexeril.  Subtherapeutic on Coumadin level. INR is 1.42. I do not feel that portal venous clot would likely be causing the patient's symptoms and given that the CT is fairly nondiagnostic, feel patient can follow-up closely with his primary physician for outpatient ultrasound imaging. I also instructed the patient that he needs to call his primary physician regarding adjustment of his Coumadin. Patient stated understanding. Will discharge her Tylenol and Flexeril for pain.  Final Clinical Impressions(s) / ED Diagnoses   Final diagnoses:  Acute midline low back pain without sciatica  Periumbilical abdominal pain    New Prescriptions New  Prescriptions   ACETAMINOPHEN (TYLENOL) 500 MG TABLET    Take 1 tablet (500 mg total) by mouth every 6 (six) hours as needed.   CYCLOBENZAPRINE (FLEXERIL) 5 MG TABLET    Take 1 tablet (5 mg total) by mouth 3 (three) times daily as needed for muscle spasms.     Shon Baton, MD 06/08/17 [phone]

## 2017-06-08 NOTE — Discharge Instructions (Signed)
You were seen today for back pain and abdominal pain. Your workup is largely reassuring. There is some question on her CT scan regarding your portal venous system in your liver. Your INR was subtherapeutic at 1.42.  Follow-up closely with your primary Dr. For adjustments of her Coumadin. You may also need an outpatient ultrasound to evaluate your portal venous system in your liver. This can be ordered by your primary physician. Doubt this is contributing to your complaints today. Take Tylenol and Flexeril as needed for pain.

## 2017-06-24 DIAGNOSIS — M87852 Other osteonecrosis, left femur: Secondary | ICD-10-CM | POA: Diagnosis not present

## 2017-06-24 DIAGNOSIS — Z7901 Long term (current) use of anticoagulants: Secondary | ICD-10-CM | POA: Diagnosis not present

## 2017-06-24 DIAGNOSIS — Z6828 Body mass index (BMI) 28.0-28.9, adult: Secondary | ICD-10-CM | POA: Diagnosis not present

## 2017-06-24 DIAGNOSIS — I82523 Chronic embolism and thrombosis of iliac vein, bilateral: Secondary | ICD-10-CM | POA: Diagnosis not present

## 2017-06-24 DIAGNOSIS — M87851 Other osteonecrosis, right femur: Secondary | ICD-10-CM | POA: Diagnosis not present

## 2017-06-29 DIAGNOSIS — Z7901 Long term (current) use of anticoagulants: Secondary | ICD-10-CM | POA: Diagnosis not present

## 2017-07-01 DIAGNOSIS — M25552 Pain in left hip: Secondary | ICD-10-CM | POA: Diagnosis not present

## 2017-07-01 DIAGNOSIS — M545 Low back pain: Secondary | ICD-10-CM | POA: Diagnosis not present

## 2017-07-01 DIAGNOSIS — M25551 Pain in right hip: Secondary | ICD-10-CM | POA: Diagnosis not present

## 2017-07-25 DIAGNOSIS — I10 Essential (primary) hypertension: Secondary | ICD-10-CM | POA: Diagnosis not present

## 2017-07-25 DIAGNOSIS — Z7901 Long term (current) use of anticoagulants: Secondary | ICD-10-CM | POA: Diagnosis not present

## 2017-07-25 DIAGNOSIS — M545 Low back pain: Secondary | ICD-10-CM | POA: Diagnosis not present

## 2017-10-06 DIAGNOSIS — N419 Inflammatory disease of prostate, unspecified: Secondary | ICD-10-CM | POA: Diagnosis not present

## 2017-10-08 ENCOUNTER — Other Ambulatory Visit: Payer: Self-pay

## 2017-10-08 ENCOUNTER — Inpatient Hospital Stay (HOSPITAL_COMMUNITY)
Admission: EM | Admit: 2017-10-08 | Discharge: 2017-10-09 | DRG: 638 | Disposition: A | Discharge status: Home / Self Care | Payer: BLUE CROSS/BLUE SHIELD | Attending: Family Medicine | Admitting: Family Medicine

## 2017-10-08 ENCOUNTER — Encounter (HOSPITAL_COMMUNITY): Payer: Self-pay | Admitting: Emergency Medicine

## 2017-10-08 DIAGNOSIS — N179 Acute kidney failure, unspecified: Secondary | ICD-10-CM | POA: Diagnosis not present

## 2017-10-08 DIAGNOSIS — I824Y9 Acute embolism and thrombosis of unspecified deep veins of unspecified proximal lower extremity: Secondary | ICD-10-CM

## 2017-10-08 DIAGNOSIS — R631 Polydipsia: Secondary | ICD-10-CM | POA: Diagnosis not present

## 2017-10-08 DIAGNOSIS — Z7901 Long term (current) use of anticoagulants: Secondary | ICD-10-CM | POA: Diagnosis not present

## 2017-10-08 DIAGNOSIS — I10 Essential (primary) hypertension: Secondary | ICD-10-CM | POA: Diagnosis not present

## 2017-10-08 DIAGNOSIS — N400 Enlarged prostate without lower urinary tract symptoms: Secondary | ICD-10-CM | POA: Diagnosis present

## 2017-10-08 DIAGNOSIS — E860 Dehydration: Secondary | ICD-10-CM | POA: Diagnosis not present

## 2017-10-08 DIAGNOSIS — Z86711 Personal history of pulmonary embolism: Secondary | ICD-10-CM | POA: Diagnosis not present

## 2017-10-08 DIAGNOSIS — F17210 Nicotine dependence, cigarettes, uncomplicated: Secondary | ICD-10-CM | POA: Diagnosis present

## 2017-10-08 DIAGNOSIS — E1110 Type 2 diabetes mellitus with ketoacidosis without coma: Secondary | ICD-10-CM | POA: Diagnosis not present

## 2017-10-08 DIAGNOSIS — I82409 Acute embolism and thrombosis of unspecified deep veins of unspecified lower extremity: Secondary | ICD-10-CM | POA: Diagnosis present

## 2017-10-08 DIAGNOSIS — Z9119 Patient's noncompliance with other medical treatment and regimen: Secondary | ICD-10-CM

## 2017-10-08 DIAGNOSIS — Z86718 Personal history of other venous thrombosis and embolism: Secondary | ICD-10-CM | POA: Diagnosis not present

## 2017-10-08 DIAGNOSIS — E1165 Type 2 diabetes mellitus with hyperglycemia: Secondary | ICD-10-CM | POA: Diagnosis not present

## 2017-10-08 DIAGNOSIS — Z79899 Other long term (current) drug therapy: Secondary | ICD-10-CM | POA: Diagnosis not present

## 2017-10-08 DIAGNOSIS — E119 Type 2 diabetes mellitus without complications: Secondary | ICD-10-CM

## 2017-10-08 DIAGNOSIS — I2699 Other pulmonary embolism without acute cor pulmonale: Secondary | ICD-10-CM | POA: Diagnosis present

## 2017-10-08 DIAGNOSIS — H539 Unspecified visual disturbance: Secondary | ICD-10-CM | POA: Diagnosis not present

## 2017-10-08 DIAGNOSIS — Z720 Tobacco use: Secondary | ICD-10-CM | POA: Diagnosis present

## 2017-10-08 DIAGNOSIS — Z905 Acquired absence of kidney: Secondary | ICD-10-CM | POA: Diagnosis not present

## 2017-10-08 DIAGNOSIS — R739 Hyperglycemia, unspecified: Secondary | ICD-10-CM

## 2017-10-08 LAB — GLUCOSE, CAPILLARY
GLUCOSE-CAPILLARY: 306 mg/dL — AB (ref 65–99)
Glucose-Capillary: 261 mg/dL — ABNORMAL HIGH (ref 65–99)
Glucose-Capillary: 338 mg/dL — ABNORMAL HIGH (ref 65–99)

## 2017-10-08 LAB — URINALYSIS, ROUTINE W REFLEX MICROSCOPIC
BACTERIA UA: NONE SEEN
BILIRUBIN URINE: NEGATIVE
Glucose, UA: >=500 mg/dL — AB
Hgb urine dipstick: NEGATIVE
KETONES UR: 20 mg/dL — AB
LEUKOCYTES UA: NEGATIVE
NITRITE: NEGATIVE
PH: 5 (ref 5.0–8.0)
PROTEIN: NEGATIVE mg/dL
SQUAMOUS EPITHELIAL / LPF: NONE SEEN
Specific Gravity, Urine: 1.03 (ref 1.005–1.030)

## 2017-10-08 LAB — CBC WITH DIFFERENTIAL/PLATELET
BASOS ABS: 0 10*3/uL (ref 0.0–0.1)
BASOS PCT: 0 %
Eosinophils Absolute: 0.1 10*3/uL (ref 0.0–0.7)
Eosinophils Relative: 1 %
HCT: 47.8 % (ref 39.0–52.0)
HEMOGLOBIN: 16.8 g/dL (ref 13.0–17.0)
Lymphocytes Relative: 34 %
Lymphs Abs: 3.1 10*3/uL (ref 0.7–4.0)
MCH: 31.1 pg (ref 26.0–34.0)
MCHC: 35.1 g/dL (ref 30.0–36.0)
MCV: 88.5 fL (ref 78.0–100.0)
Monocytes Absolute: 0.9 10*3/uL (ref 0.1–1.0)
Monocytes Relative: 10 %
NEUTROS PCT: 55 %
Neutro Abs: 4.9 10*3/uL (ref 1.7–7.7)
Platelets: 224 10*3/uL (ref 150–400)
RBC: 5.4 MIL/uL (ref 4.22–5.81)
RDW: 11.7 % (ref 11.5–15.5)
WBC: 9 10*3/uL (ref 4.0–10.5)

## 2017-10-08 LAB — BASIC METABOLIC PANEL
ANION GAP: 20 — AB (ref 5–15)
Anion gap: 8 (ref 5–15)
BUN: 23 mg/dL — ABNORMAL HIGH (ref 6–20)
BUN: 16 mg/dL (ref 6–20)
CALCIUM: 8.4 mg/dL — AB (ref 8.9–10.3)
CHLORIDE: 99 mmol/L — AB (ref 101–111)
CO2: 20 mmol/L — ABNORMAL LOW (ref 22–32)
CO2: 23 mmol/L (ref 22–32)
CREATININE: 1.37 mg/dL — AB (ref 0.61–1.24)
Calcium: 10.2 mg/dL (ref 8.9–10.3)
Chloride: 87 mmol/L — ABNORMAL LOW (ref 101–111)
Creatinine, Ser: 1.93 mg/dL — ABNORMAL HIGH (ref 0.61–1.24)
GFR calc Af Amer: >60 mL/min (ref >60)
GFR calc non Af Amer: 37 mL/min — ABNORMAL LOW (ref >60)
GFR calc non Af Amer: 57 mL/min — ABNORMAL LOW (ref >60)
GFR, EST AFRICAN AMERICAN: 43 mL/min — AB (ref >60)
GLUCOSE: 682 mg/dL — AB (ref 65–99)
GLUCOSE: 352 mg/dL — AB (ref 65–99)
POTASSIUM: 4.2 mmol/L (ref 3.5–5.1)
Potassium: 3.5 mmol/L (ref 3.5–5.1)
Sodium: 127 mmol/L — ABNORMAL LOW (ref 135–145)
Sodium: 130 mmol/L — ABNORMAL LOW (ref 135–145)

## 2017-10-08 LAB — CBG MONITORING, ED
GLUCOSE-CAPILLARY: 313 mg/dL — AB (ref 65–99)
Glucose-Capillary: 502 mg/dL (ref 65–99)
Glucose-Capillary: 301 mg/dL — ABNORMAL HIGH (ref 65–99)

## 2017-10-08 LAB — HEMOGLOBIN A1C
Hgb A1c MFr Bld: 11.4 % — ABNORMAL HIGH (ref 4.8–5.6)
MEAN PLASMA GLUCOSE: 280.48 mg/dL

## 2017-10-08 LAB — PROTIME-INR
INR: 2.56
PROTHROMBIN TIME: 27.3 s — AB (ref 11.4–15.2)

## 2017-10-08 MED ORDER — INSULIN ASPART 100 UNIT/ML ~~LOC~~ SOLN
10.0000 [IU] | Freq: Once | SUBCUTANEOUS | Status: AC
  Administered 2017-10-08: 10 [IU] via INTRAVENOUS
  Filled 2017-10-08: qty 1

## 2017-10-08 MED ORDER — INSULIN ASPART 100 UNIT/ML ~~LOC~~ SOLN
3.0000 [IU] | Freq: Three times a day (TID) | SUBCUTANEOUS | Status: DC
  Administered 2017-10-08 – 2017-10-09 (×4): 3 [IU] via SUBCUTANEOUS

## 2017-10-08 MED ORDER — NICOTINE 14 MG/24HR TD PT24
14.0000 mg | MEDICATED_PATCH | Freq: Every day | TRANSDERMAL | Status: DC
  Filled 2017-10-08 (×2): qty 1

## 2017-10-08 MED ORDER — SODIUM CHLORIDE 0.9 % IV BOLUS (SEPSIS)
1000.0000 mL | Freq: Once | INTRAVENOUS | Status: AC
  Administered 2017-10-08: 1000 mL via INTRAVENOUS

## 2017-10-08 MED ORDER — ACETAMINOPHEN 325 MG PO TABS
650.0000 mg | ORAL_TABLET | Freq: Four times a day (QID) | ORAL | Status: DC | PRN
  Administered 2017-10-08 – 2017-10-09 (×2): 650 mg via ORAL
  Filled 2017-10-08 (×2): qty 2

## 2017-10-08 MED ORDER — TRAZODONE HCL 50 MG PO TABS: 50.0000 mg | ORAL_TABLET | Freq: Every evening | ORAL | Status: DC | PRN

## 2017-10-08 MED ORDER — INSULIN ASPART 100 UNIT/ML ~~LOC~~ SOLN
0.0000 [IU] | Freq: Three times a day (TID) | SUBCUTANEOUS | Status: DC
  Administered 2017-10-08 (×2): 7 [IU] via SUBCUTANEOUS
  Administered 2017-10-08: 5 [IU] via SUBCUTANEOUS
  Administered 2017-10-09: 2 [IU] via SUBCUTANEOUS
  Administered 2017-10-09: 5 [IU] via SUBCUTANEOUS

## 2017-10-08 MED ORDER — SODIUM CHLORIDE 0.9 % IV SOLN: 250.0000 mL | INTRAVENOUS | Status: DC | PRN

## 2017-10-08 MED ORDER — POTASSIUM CHLORIDE CRYS ER 20 MEQ PO TBCR
40.0000 meq | EXTENDED_RELEASE_TABLET | Freq: Once | ORAL | Status: AC
  Administered 2017-10-08: 40 meq via ORAL
  Filled 2017-10-08: qty 2

## 2017-10-08 MED ORDER — ONDANSETRON HCL 4 MG/2ML IJ SOLN: 4.0000 mg | Freq: Four times a day (QID) | INTRAMUSCULAR | Status: DC | PRN

## 2017-10-08 MED ORDER — INSULIN DETEMIR 100 UNIT/ML ~~LOC~~ SOLN
10.0000 [IU] | Freq: Every day | SUBCUTANEOUS | Status: DC
  Filled 2017-10-08 (×3): qty 0.1

## 2017-10-08 MED ORDER — WARFARIN - PHYSICIAN DOSING INPATIENT: Freq: Every day | Status: DC

## 2017-10-08 MED ORDER — ONDANSETRON HCL 4 MG PO TABS: 4.0000 mg | ORAL_TABLET | Freq: Four times a day (QID) | ORAL | Status: DC | PRN

## 2017-10-08 MED ORDER — GUAIFENESIN ER 600 MG PO TB12
600.0000 mg | ORAL_TABLET | Freq: Two times a day (BID) | ORAL | Status: DC
  Administered 2017-10-08 – 2017-10-09 (×3): 600 mg via ORAL
  Filled 2017-10-08 (×3): qty 1

## 2017-10-08 MED ORDER — POLYETHYLENE GLYCOL 3350 17 G PO PACK: 17.0000 g | PACK | Freq: Every day | ORAL | Status: DC | PRN

## 2017-10-08 MED ORDER — INSULIN STARTER KIT- PEN NEEDLES (ENGLISH)
1.0000 | Freq: Once | Status: AC
  Administered 2017-10-08: 1
  Filled 2017-10-08 (×3): qty 1

## 2017-10-08 MED ORDER — SODIUM CHLORIDE 0.9 % IV SOLN
INTRAVENOUS | Status: DC
  Administered 2017-10-08: 08:00:00 via INTRAVENOUS

## 2017-10-08 MED ORDER — ACETAMINOPHEN 650 MG RE SUPP: 650.0000 mg | Freq: Four times a day (QID) | RECTAL | Status: DC | PRN

## 2017-10-08 MED ORDER — INSULIN ASPART 100 UNIT/ML ~~LOC~~ SOLN
0.0000 [IU] | Freq: Every day | SUBCUTANEOUS | Status: DC
  Administered 2017-10-08: 4 [IU] via SUBCUTANEOUS

## 2017-10-08 MED ORDER — ALBUTEROL SULFATE (2.5 MG/3ML) 0.083% IN NEBU: 2.5000 mg | INHALATION_SOLUTION | RESPIRATORY_TRACT | Status: DC | PRN

## 2017-10-08 MED ORDER — SODIUM CHLORIDE 0.9% FLUSH
3.0000 mL | Freq: Two times a day (BID) | INTRAVENOUS | Status: DC
  Administered 2017-10-08: 3 mL via INTRAVENOUS

## 2017-10-08 MED ORDER — CYCLOBENZAPRINE HCL 10 MG PO TABS: 5.0000 mg | ORAL_TABLET | Freq: Three times a day (TID) | ORAL | Status: DC | PRN

## 2017-10-08 MED ORDER — SODIUM CHLORIDE 0.9% FLUSH: 3.0000 mL | INTRAVENOUS | Status: DC | PRN

## 2017-10-08 MED ORDER — WARFARIN SODIUM 5 MG PO TABS: 10.0000 mg | ORAL_TABLET | ORAL | Status: DC

## 2017-10-08 MED ORDER — LIVING WELL WITH DIABETES BOOK
Freq: Once | Status: AC
  Administered 2017-10-08: 18:00:00
  Filled 2017-10-08 (×2): qty 1

## 2017-10-08 MED ORDER — INSULIN DETEMIR 100 UNIT/ML ~~LOC~~ SOLN
10.0000 [IU] | Freq: Two times a day (BID) | SUBCUTANEOUS | Status: DC
  Administered 2017-10-08 – 2017-10-09 (×2): 10 [IU] via SUBCUTANEOUS
  Filled 2017-10-08 (×4): qty 0.1

## 2017-10-08 MED ORDER — SENNA 8.6 MG PO TABS
1.0000 | ORAL_TABLET | Freq: Two times a day (BID) | ORAL | Status: DC
  Administered 2017-10-08: 8.6 mg via ORAL
  Filled 2017-10-08 (×8): qty 1

## 2017-10-08 MED ORDER — SODIUM CHLORIDE 0.9 % IV BOLUS (SEPSIS)
1000.0000 mL | Freq: Once | INTRAVENOUS | Status: AC
Start: 2017-10-08 — End: 2017-10-08
  Administered 2017-10-08: 1000 mL via INTRAVENOUS

## 2017-10-08 NOTE — H&P (Signed)
Patient Demographics:    Gregory Andrews, is a 57 y.o. male  MRN: [id number]   DOB - [birth year]  Admit Date - 10/08/2017  Outpatient Primary MD for the patient is Mirna MiresHill, Gerald, MD   Assessment & Plan:    Principal Problem:   DKA (diabetic ketoacidosis) (HCC) Active Problems:   Recurrent pulmonary embolism   Recurrent DVT (deep venous thrombosis)   New onset type 2 diabetes mellitus (HCC)   Chronic anticoagulation for Recurrent DVT/PE   Tobacco abuse   1)DKA-new onset DM, initially on admission bicarb is down to 20, glucose is 682, anion gap is 20, urine with glucosuria and ketones, with aggressive IV fluid hydration bicarb is up to 23, glucose is down to 352,, anion gap is corrected and is now 8, give Lantus 10 units nightly, resistant sliding scale insulin coverage, diabetic education.  A1c and lipid profile pending.  Possible discharge home in 1-2 days on metformin if renal function improves enough along with Amaryl with meals, patient may need Lantus insulin nightly.   dietary modifications and lifestyle changes advised  2)H/o Recurrent DVT x 3 and H/o Prior Bil PE--- patient requires lifelong anticoagulation, despite negative hypercoagulability study in the past, he has recurrent DVTs and PE warrants lifelong anticoagulation . patient has had DVTs typically when he is off anticoagulation, patient states he is compliant with Coumadin therapy however he admits that he does not check his INR frequently due to his work schedule.  He goes several months at a time without checking his INR, available records suggest erratic INRs, INR was 5.0 on 10/04/16 and 1.42 on 06/08/2017...   Given patient's inability to check INR regularly due to his work schedule and given erratic nature of his INR alternative anticoagulation  discussed with patient and his significant other at bedside at this time patient is leaning towards possibly using Eliquis.  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   4)Tobacco Abuse-smoking cessation advised, patient is interested in quitting, give nicotine patch  5)H/o HTN-stop HCTZ given dehydration, consider amlodipine for BP control, may use lisinopril if renal function improves   At the time of admission, it appears that the appropriate admission status for this patient is INPATIENT. This is judged to be reasonable and necessary in order to provide the required intensity of service to ensure the patient's safety given the presenting symptoms, physical exam findings, and initial radiographic and laboratory data in the context of their  Comorbid conditions-new onset diabetes with diabetic ketoacidosis, acute kidney injury  with creatinine up to 1.9 in the patient with only one solitary right kidney, history of recurrent blood clots.  Patient requires inpatient status due to high intensity of service, high risk for further deterioration and high frequency of surveillance required.   I certify that at the point of admission it is my clinical judgment that the patient will require inpatient hospital care spanning beyond 2 midnights   With History of - Reviewed by me  Past Medical History:  Diagnosis Date  . BPH (benign prostatic hyperplasia)   . Noncompliance 01/21/2012   Missed appointments and lab appointments  . Pulmonary embolism (HCC)   .  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 Recurrent pulmonary embolism 01/21/2012   On life-long Coumadin  . Shoulder separation    left      Past Surgical History:  Procedure Laterality Date  . left kidney removed    . right knee surgery        Chief Complaint  Patient presents with  . Weakness      HPI:    Gregory Andrews  is a 57 y.o. male with past medical history relevant for status post left nephrectomy previously, hypertension and history of recurrent DVT/PE despite negative hypercoagulability workup in the past who presents with polyuria polydipsia for over a week now.  It was initially seen in urgent care clinic with similar complaints told he may have a UTI or prostatitis and was given Keflex antibiotics.  Polyuria, generalized fatigue, malaise and polydipsia persisted so patient came to the ED today for further evaluation  No chest pain, no palpitations, no dizziness, no leg pains no leg swelling no chest pains no shortness of breath no dyspnea on exertion no pleuritic symptoms  In ED...... UA showed glucosuria and ketonuria, sodium was low at 127 due to hyperglycemia with a glucose of 682, bicarb was low at 20 , creatinine was up to 1.3 from a baseline of 1.2 and anion gap was 20  Due to DKA/new onset diabetes diagnosis patient was aggressively hydrated in the ED blood sugars and lab values improved with IV hydration  Additional history obtained from patient's significant other at bedside   Review of systems:    In addition to the HPI above,   A full 12 point Review of 10 Systems was done, except as stated above, all other Review of 10 Systems were negative.    Social History:  Reviewed by me    Social History   Tobacco Use  . Smoking status: Current Every Day Smoker    Types: Cigarettes  . Smokeless tobacco: Former Neurosurgeon    Quit date: 01/12/2012  Substance Use Topics  . Alcohol use: Yes    Comment: beer  1 - 2 week        Family History :  Reviewed by me    Family History  Problem Relation Age of Onset  . Anesthesia problems Neg Hx    No DM in first-degree relatives  Home Medications:   Prior to Admission medications   Medication Sig Start Date End Date Taking? Authorizing Provider  acetaminophen (TYLENOL) 500 MG tablet Take 1 tablet (500 mg total) by mouth every 6 (six) hours as needed. 06/08/17  Yes Horton, Mayer Masker, MD  cephALEXin (KEFLEX) 500 MG capsule Take 1 capsule (500 mg total) by mouth 3 (three) times daily. Patient taking differently: Take 500 mg by mouth 2 (two) times daily.  10/04/16  Yes Everlene Farrier,  PA-C  hydrochlorothiazide (HYDRODIURIL) 25 MG tablet Take 12.5 mg by mouth daily.   Yes [provider]  warfarin (COUMADIN) 10 MG tablet Take 10 mg by mouth every morning. Except on Mondays   Yes [provider]     Allergies:     Allergies  Allergen Reactions  . Codeine Shortness Of Breath     Physical Exam:   Vitals  Blood pressure 132/75, pulse 77, temperature 97.9 F (36.6 C), temperature source Oral, resp. rate 17, height 5\' 10"  (1.778 m), weight 98.7 kg (217 lb 9.5 oz), SpO2 98 %.  Physical Examination: General appearance - alert, well appearing, and in no distress and  Mental status - alert, oriented to person, place, and time, Eyes - sclera anicteric Neck - supple, no JVD elevation , Chest - clear  to auscultation bilaterally, symmetrical air movement,  Heart - S1 and S2 normal,  Abdomen - soft, nontender, nondistended, no masses or organomegaly Neurological - screening mental status exam normal, neck supple without rigidity, cranial nerves II through XII intact, DTR's normal and symmetric Extremities - no pedal edema noted, intact peripheral pulses  Skin - warm, dry Psych-affect is appropriate   Data Review:    CBC Recent Labs  Lab 10/08/17 0500  WBC 9.0  HGB 16.8  HCT 47.8  PLT 224  MCV 88.5  MCH 31.1  MCHC 35.1  RDW 11.7   LYMPHSABS 3.1  MONOABS 0.9  EOSABS 0.1  BASOSABS 0.0   ------------------------------------------------------------------------------------------------------------------  Chemistries  Recent Labs  Lab 10/08/17 0500 10/08/17 1151  NA 127* 130*  K 4.2 3.5  CL 87* 99*  CO2 20* 23  GLUCOSE 682* 352*  BUN 23* 16  CREATININE 1.93* 1.37*  CALCIUM 10.2 8.4*   ------------------------------------------------------------------------------------------------------------------ estimated creatinine clearance is 71.8 mL/min (A) (by C-G formula based on SCr of 1.37 mg/dL (H)). ------------------------------------------------------------------------------------------------------------------ No results for input(s): TSH, T4TOTAL, T3FREE, THYROIDAB in the last 72 hours.  Invalid input(s): FREET3   Coagulation profile Recent Labs  Lab 10/08/17 0459  INR 2.56   ------------------------------------------------------------------------------------------------------------------- No results for input(s): DDIMER in the last 72 hours. -------------------------------------------------------------------------------------------------------------------  Cardiac Enzymes No results for input(s): CKMB, TROPONINI, MYOGLOBIN in the last 168 hours.  Invalid input(s): CK ------------------------------------------------------------------------------------------------------------------ No results found for: BNP   ---------------------------------------------------------------------------------------------------------------  Urinalysis    Component Value Date/Time   COLORURINE STRAW (A) 10/08/2017 0450   APPEARANCEUR CLEAR 10/08/2017 0450   LABSPEC 1.030 10/08/2017 0450   PHURINE 5.0 10/08/2017 0450   GLUCOSEU >=500 (A) 10/08/2017 0450   HGBUR NEGATIVE 10/08/2017 0450   BILIRUBINUR NEGATIVE 10/08/2017 0450   KETONESUR 20 (A) 10/08/2017 0450   PROTEINUR NEGATIVE 10/08/2017 0450   UROBILINOGEN 1.0  08/12/2007 0647   NITRITE NEGATIVE 10/08/2017 0450   LEUKOCYTESUR NEGATIVE 10/08/2017 0450    ----------------------------------------------------------------------------------------------------------------   Imaging Results:    No results found.  Radiological Exams on Admission: No results found.  DVT Prophylaxis -SCD   AM Labs Ordered, also please review Full Orders  Family Communication: Admission, patients condition and plan of care including tests being ordered have been discussed with the patient and s/o who indicate understanding and agree with the plan   Code Status - Full Code  Likely DC to  home  Condition   stable  At the time of admission, it appears that the appropriate admission status for this patient is INPATIENT. This is judged to be reasonable and necessary in order to provide the required intensity of service to ensure the patient's safety  given the presenting symptoms, physical exam findings, and initial radiographic and laboratory data in the context of their  Comorbid conditions-new onset diabetes with diabetic ketoacidosis, acute kidney injury with creatinine up to 1.9 in the patient with only one solitary right kidney, history of recurrent blood clots.  Patient requires inpatient status due to high intensity of service, high risk for further deterioration and high frequency of surveillance required.   I certify that at the point of admission it is my clinical judgment that the patient will require inpatient hospital care spanning beyond 2 midnights  Shon Hale M.D on 10/08/2017 at 4:14 PM   Between 7am to 7pm - Pager - [phone] After 7pm go to [email or web address] - password [email or web address]  [facility] - Office  [phone]  Voice Recognition Reubin Milan dictation system was used to create this note, attempts have been made to correct errors. Please contact the author with questions and/or clarifications.

## 2017-10-08 NOTE — Progress Notes (Signed)
Nutrition Brief Note  RD consulted due to new onset diabetes.   Wt Readings from Last 15 Encounters:  10/08/17 217 lb 9.5 oz (98.7 kg)  06/08/17 205 lb (93 kg)  10/04/16 210 lb (95.3 kg)  05/27/14 210 lb (95.3 kg)  11/01/12 183 lb 6.8 oz (83.2 kg)  04/14/12 205 lb (93 kg)  01/24/12 198 lb 1.6 oz (89.9 kg)  01/09/12 215 lb (97.5 kg)   Body mass index is 31.22 kg/m. Patient meets criteria for obese based on current BMI.   No RD on site on weekends. See DM educator has ordered Living Well with Diabetes Booklet.   RD placed referral to the outpatient diabetes and weight management center for new onset diabetes.   If still admitted on Monday, can F/U and do in person education.   No nutrition interventions warranted at this time. If nutrition issues arise, please consult RD.   Gregory LouisNathan Andrews RD, LDN, CNSC Clinical Nutrition Pager: [phone] 10/08/2017 3:36 PM

## 2017-10-08 NOTE — ED Notes (Signed)
Critical result. Glucose 682. MD notified.

## 2017-10-08 NOTE — Progress Notes (Signed)
Inpatient Diabetes Program Recommendations  AACE/ADA: New Consensus Statement on Inpatient Glycemic Control (2015)  Target Ranges:  Prepandial:   less than 140 mg/dL      Peak postprandial:   less than 180 mg/dL (1-2 hours)      Critically ill patients:  140 - 180 mg/dL   Lab Results  Component Value Date   GLUCAP 313 (H) 10/08/2017    Review of Glycemic Control Results for Mankins, Kaidin B (MRN [id number]) as of 10/08/2017 08:33  Ref. Range 10/08/2017 04:46 10/08/2017 06:07 10/08/2017 07:12  Glucose-Capillary Latest Ref Range: 65 - 99 mg/dL >600 (HH) 502 (HH) 313 (H)   Diabetes history: New onset Current orders for Inpatient glycemic control: Levemir 10 units qd + Novolog 3 units tid meal coverage + Novolog sensitive tid + hs 0-5 units  Inpatient Diabetes Program Recommendations:   Spoke with RN Bryan Patraw concerning CBGs. -Increase Levemir 10 units bid Orders placed for Living well with diabetes,starter kit for pen needles, and patient education.  Thank you, Judy E. Hanks, RN, MSN, CDE  Diabetes Coordinator Inpatient Glycemic Control Team Team Pager #[phone] (8am-5pm) 10/08/2017 9:07 AM    

## 2017-10-08 NOTE — ED Provider Notes (Addendum)
[facility] EMERGENCY DEPARTMENT Provider Note   CSN: [id number] Arrival date & time: 10/08/17  0429     History   Chief Complaint Chief Complaint  Patient presents with  . Weakness    HPI Gregory Andrews is a 57 y.o. male.  Patient presents to the emergency department for evaluation of generalized weakness.  Patient reports for the last 2 days he has been feeling poorly.  He has had a headache, blurred vision, increased thirst and urination.  He reports that he cannot drink enough, mouth is staying dry.  He has not had vomiting or diarrhea.  There has been a slight cough.  He went to urgent care 2 days ago and was told he might have prostatitis, was started on antibiotic.      Past Medical History:  Diagnosis Date  . BPH (benign prostatic hyperplasia)   . Noncompliance 01/21/2012   Missed appointments and lab appointments  . Pulmonary embolism (HCC)   . 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 Recurrent pulmonary embolism 01/21/2012   On life-long Coumadin  . Shoulder separation    left    Patient Active Problem List   Diagnosis Date Noted  . Recurrent pulmonary embolism 01/21/2012  . 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 Noncompliance 01/21/2012    Past Surgical History:  Procedure Laterality Date  . left kidney removed    . right knee surgery         Home Medications    Prior to Admission medications   Medication Sig Start Date End Date Taking? Authorizing Provider  hydrochlorothiazide (HYDRODIURIL) 25 MG tablet Take 12.5 mg by mouth daily.   Yes [provider]  acetaminophen (TYLENOL) 500 MG tablet Take 1 tablet (500 mg total) by mouth every 6 (six) hours as needed. 06/08/17   Horton, Mayer Maskerourtney F, MD  cephALEXin (KEFLEX) 500 MG capsule Take 1 capsule (500 mg total) by mouth 3 (three) times daily. 10/04/16   Everlene Farrieransie, William, PA-C  cyclobenzaprine (FLEXERIL) 5 MG tablet Take 1 tablet (5 mg  total) by mouth 3 (three) times daily as needed for muscle spasms. 06/08/17   Horton, Mayer Maskerourtney F, MD  guaiFENesin (MUCINEX) 600 MG 12 hr tablet Take 600 mg by mouth 2 (two) times daily.    [provider]  warfarin (COUMADIN) 10 MG tablet Take 10 mg by mouth every morning. Except on Mondays    [provider]    Family History Family History  Problem Relation Age of Onset  . Anesthesia problems Neg Hx     Social History Social History   Tobacco Use  . Smoking status: Current Every Day Smoker    Types: Cigarettes  . Smokeless tobacco: Former NeurosurgeonUser    Quit date: 01/12/2012  Substance Use Topics  . Alcohol use: Yes    Comment: beer  1 - 2 week  . Drug use: No     Allergies   Codeine   Review of Systems Review of Systems  Eyes: Positive for visual disturbance.  Endocrine: Positive for polydipsia and polyuria.     Physical Exam Updated Vital Signs BP (!) 139/91 (BP Location: Left Arm)   Pulse 98   Temp 98 F (36.7 C) (Oral)   Resp 15   Ht 5\' 10"  (1.778 m)   Wt 95.3 kg (210 lb)   SpO2 96%   BMI 30.13 kg/m   Physical Exam  Constitutional: He is oriented to person, place, and  time. He appears well-developed and well-nourished. No distress.  HENT:  Head: Normocephalic and atraumatic.  Right Ear: Hearing normal.  Left Ear: Hearing normal.  Nose: Nose normal.  Mouth/Throat: Oropharynx is clear and moist. Mucous membranes are dry.  Eyes: Conjunctivae and EOM are normal. Pupils are equal, round, and reactive to light.  Neck: Normal range of motion. Neck supple.  Cardiovascular: Regular rhythm, S1 normal and S2 normal. Exam reveals no gallop and no friction rub.  No murmur heard. Pulmonary/Chest: Effort normal and breath sounds normal. No respiratory distress. He exhibits no tenderness.  Abdominal: Soft. Normal appearance and bowel sounds are normal. There is no hepatosplenomegaly. There is no tenderness. There is no rebound, no guarding, no tenderness  at McBurney's point and negative Murphy's sign. No hernia.  Musculoskeletal: Normal range of motion.  Neurological: He is alert and oriented to person, place, and time. He has normal strength. No cranial nerve deficit or sensory deficit. Coordination normal. GCS eye subscore is 4. GCS verbal subscore is 5. GCS motor subscore is 6.  Skin: Skin is warm, dry and intact. No rash noted. No cyanosis.  Psychiatric: He has a normal mood and affect. His speech is normal and behavior is normal. Thought content normal.  Nursing note and vitals reviewed.    ED Treatments / Results  Labs (all labs ordered are listed, but only abnormal results are displayed) Labs Reviewed  CBG MONITORING, ED - Abnormal; Notable for the following components:      Result Value   Glucose-Capillary >600 (*)    All other components within normal limits  CBC WITH DIFFERENTIAL/PLATELET  BASIC METABOLIC PANEL  URINALYSIS, ROUTINE W REFLEX MICROSCOPIC  HEMOGLOBIN A1C    EKG  EKG Interpretation None       Radiology No results found.  Procedures Procedures (including critical care time)  Medications Ordered in ED Medications  sodium chloride 0.9 % bolus 1,000 mL (not administered)    Followed by  sodium chloride 0.9 % bolus 1,000 mL (not administered)  insulin aspart (novoLOG) injection 10 Units (not administered)     Initial Impression / Assessment and Plan / ED Course  I have reviewed the triage vital signs and the nursing notes.  Pertinent labs & imaging results that were available during my care of the patient were reviewed by me and considered in my medical decision making (see chart for details).     Patient presents with polyuria, polydipsia, generalized weakness.  CBG performed at arrival was greater than 600, indicating new onset diabetes.  The patient aggressively hydrated, administered IV insulin.  Blood work reveals acute kidney injury secondary to dehydration.  This is of concern, however, as  patient has had previous left nephrectomy.  He also has mild acidosis with an anion gap, CO2 is 20, possibly mild DKA.  Consult hospitalist for further management.  Final Clinical Impressions(s) / ED Diagnoses   Final diagnoses:  Diabetes mellitus, new onset [facility])  Dehydration  Hyperglycemia    ED Discharge Orders    None       Gilda Crease, MD 10/08/17 1610    Gilda Crease, MD 10/08/17 [phone]

## 2017-10-08 NOTE — ED Triage Notes (Signed)
Pt states he has been increasingly weak starting 1 week ago. Pt states he has had increased urination, increased thirst, and blurred vision.

## 2017-10-09 DIAGNOSIS — I824Y9 Acute embolism and thrombosis of unspecified deep veins of unspecified proximal lower extremity: Secondary | ICD-10-CM

## 2017-10-09 DIAGNOSIS — E860 Dehydration: Secondary | ICD-10-CM

## 2017-10-09 DIAGNOSIS — Z720 Tobacco use: Secondary | ICD-10-CM

## 2017-10-09 LAB — PROTIME-INR
INR: 2.33
Prothrombin Time: 25.4 seconds — ABNORMAL HIGH (ref 11.4–15.2)

## 2017-10-09 LAB — CBC
HCT: 42.4 % (ref 39.0–52.0)
Hemoglobin: 14.5 g/dL (ref 13.0–17.0)
MCH: 30.3 pg (ref 26.0–34.0)
MCHC: 34.2 g/dL (ref 30.0–36.0)
MCV: 88.5 fL (ref 78.0–100.0)
PLATELETS: 195 10*3/uL (ref 150–400)
RBC: 4.79 MIL/uL (ref 4.22–5.81)
RDW: 11.6 % (ref 11.5–15.5)
WBC: 6.9 10*3/uL (ref 4.0–10.5)

## 2017-10-09 LAB — LIPID PANEL
CHOL/HDL RATIO: 3.4 ratio
CHOLESTEROL: 141 mg/dL (ref 0–200)
HDL: 41 mg/dL (ref >40)
LDL Cholesterol: 85 mg/dL (ref 0–99)
Triglycerides: 75 mg/dL (ref <150)
VLDL: 15 mg/dL (ref 0–40)

## 2017-10-09 LAB — BASIC METABOLIC PANEL
Anion gap: 9 (ref 5–15)
BUN: 13 mg/dL (ref 6–20)
CALCIUM: 8.5 mg/dL — AB (ref 8.9–10.3)
CHLORIDE: 103 mmol/L (ref 101–111)
CO2: 24 mmol/L (ref 22–32)
CREATININE: 1.15 mg/dL (ref 0.61–1.24)
GFR calc Af Amer: >60 mL/min (ref >60)
GFR calc non Af Amer: >60 mL/min (ref >60)
GLUCOSE: 185 mg/dL — AB (ref 65–99)
Potassium: 3.7 mmol/L (ref 3.5–5.1)
Sodium: 136 mmol/L (ref 135–145)

## 2017-10-09 LAB — GLUCOSE, CAPILLARY
Glucose-Capillary: 160 mg/dL — ABNORMAL HIGH (ref 65–99)
Glucose-Capillary: 267 mg/dL — ABNORMAL HIGH (ref 65–99)

## 2017-10-09 LAB — HIV ANTIBODY (ROUTINE TESTING W REFLEX): HIV Screen 4th Generation wRfx: NONREACTIVE

## 2017-10-09 MED ORDER — LISINOPRIL 10 MG PO TABS: 10.0000 mg | ORAL_TABLET | Freq: Every day | ORAL | 1 refills | Status: DC

## 2017-10-09 MED ORDER — GLIMEPIRIDE 2 MG PO TABS
2.0000 mg | ORAL_TABLET | Freq: Every day | ORAL | Status: DC
  Administered 2017-10-09: 2 mg via ORAL
  Filled 2017-10-09: qty 1

## 2017-10-09 MED ORDER — RELION PRIME MONITOR DEVI: 1.0000 | 1 refills | Status: DC

## 2017-10-09 MED ORDER — METFORMIN HCL 850 MG PO TABS: 850.0000 mg | ORAL_TABLET | Freq: Two times a day (BID) | ORAL | 2 refills | Status: DC

## 2017-10-09 MED ORDER — APIXABAN 5 MG PO TABS: 5.0000 mg | ORAL_TABLET | Freq: Two times a day (BID) | ORAL | 1 refills | Status: DC

## 2017-10-09 MED ORDER — BLOOD GLUCOSE METER KIT: PACK | 1 refills | Status: DC

## 2017-10-09 MED ORDER — CYCLOBENZAPRINE HCL 5 MG PO TABS: 5.0000 mg | ORAL_TABLET | Freq: Three times a day (TID) | ORAL | 1 refills | Status: DC | PRN

## 2017-10-09 MED ORDER — NICOTINE 14 MG/24HR TD PT24: 14.0000 mg | MEDICATED_PATCH | Freq: Every day | TRANSDERMAL | 0 refills | Status: DC

## 2017-10-09 MED ORDER — INSULIN GLARGINE 100 UNIT/ML ~~LOC~~ SOLN: SUBCUTANEOUS | 1 refills | Status: DC

## 2017-10-09 MED ORDER — GLUCOSE BLOOD VI STRP: ORAL_STRIP | 3 refills | Status: DC

## 2017-10-09 MED ORDER — GLIMEPIRIDE 2 MG PO TABS: 2.0000 mg | ORAL_TABLET | Freq: Two times a day (BID) | ORAL | 1 refills | Status: DC

## 2017-10-09 NOTE — Progress Notes (Signed)
Discharge instructions including demonstration and teach back for insulin [place] given to patient Pt states that he feels confident in the medications and how they are to be taken. Discharge to home with self.  Pt is incouraged to contact Dr Fransico HimNida office for out patient diabetes education.

## 2017-10-09 NOTE — Discharge Summary (Signed)
Gregory Andrews, is a 57 y.o. male  DOB 1961-06-12  MRN [id number].  Admission date:  10/08/2017  Admitting Physician  Roxan Hockey, MD  Discharge Date:  10/09/2017   Primary MD  Iona Beard, MD  Recommendations for primary care physician for things to follow:   1)Diabetic Diet with avoidance of simple sugars and avoidance of sweets advised 2)Start Eliquis 5 mg twice a day on 10/10/2017 in place of Coumadin for blood clot prevention 3)Avoid ibuprofen/Advil/Aleve/Motrin/Goody Powders/Naproxen/BC powders as these will make you more likely to bleed and can cause stomach ulcers 4)Take all other medications as prescribed and outlined to your discharge instructions 5)Please follow-up with your primary care doctor in 2-3 weeks for recheck, please take your diabetic/glucose diary recordings with you to this visit  6)Please avoid tobacco and alcohol 7)Further diabetic education including diabetic diet diet, diabetic lifestyle lifestyle and insulin therapy education advised as outpatient     Admission Diagnosis  Dehydration [E86.0] Hyperglycemia [R73.9] Diabetes mellitus, new onset ([place]) [E11.9]   Discharge Diagnosis  Dehydration [E86.0] Hyperglycemia [R73.9] Diabetes mellitus, new onset ([place]) [E11.9]    Principal Problem:   DKA (diabetic ketoacidosis) ([place]) Active Problems:   Recurrent pulmonary embolism   Recurrent DVT (deep venous thrombosis)   New onset type 2 diabetes mellitus ([place])   Chronic anticoagulation for Recurrent DVT/PE   Tobacco abuse      Past Medical History:  Diagnosis Date  . BPH (benign prostatic hyperplasia)   . Noncompliance 01/21/2012   Missed appointments and lab appointments  . Pulmonary embolism ([place])   . 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 Recurrent pulmonary embolism 01/21/2012   On life-long Coumadin  . Shoulder separation     left    Past Surgical History:  Procedure Laterality Date  . left kidney removed    . right knee surgery         HPI  from the history and physical done on the day of admission:    Gregory Andrews  is a 57 y.o. male with past medical history relevant for status post left nephrectomy previously, hypertension and history of recurrent DVT/PE despite negative hypercoagulability workup in the past who presents with polyuria polydipsia for over a week now.  It was initially seen in urgent care clinic with similar complaints told he may have a UTI or prostatitis and was given Keflex antibiotics.  Polyuria, generalized fatigue, malaise and polydipsia persisted so patient came to the ED today for further evaluation  No chest pain, no palpitations, no dizziness, no leg pains no leg swelling no chest pains no shortness of breath no dyspnea on exertion no pleuritic symptoms  In ED...... UA showed glucosuria and ketonuria, sodium was low at 127 due to hyperglycemia with a glucose of 682, bicarb was low at 20 , creatinine was up to 1.3 from a baseline of 1.2 and anion gap was 20  Due to DKA/new onset diabetes diagnosis patient was aggressively hydrated in the ED blood sugars and  lab values improved with IV hydration  Additional history obtained from patient's significant other at bedside       Hospital Course:     1)DKA-new onset DM, initially on admission bicarb is down to 20, glucose is 682, anion gap is 20, urine with glucosuria and ketones, with aggressive IV fluid hydration, DKA resolved, diabetic education by nursing staff and myself.  A1c is 11.4, and LDL is 85 with a HDL of 41.  Discharge home on metformin, Amaryl with meals, and Lantus 20 units every evening.  With aggressive lifestyle and dietary modifications patient may be able to come off insulin in the near future.   2)H/o Recurrent DVT x 3 and H/o Prior Bil PE--- patient requires lifelong anticoagulation, despite negative  hypercoagulability study in the past, he has recurrent DVTs and PE and  warrants lifelong anticoagulation . patient has had DVTs typically when he is off anticoagulation, patient states he is compliant with Coumadin therapy however he admits that he does not check his INR frequently due to his work schedule.  He goes several months at a time without checking his INR, available records suggest erratic INRs, INR was 5.0 on 10/04/16 and 1.42 on 06/08/2017...   Given patient's inability to check INR regularly due to his work schedule and given erratic nature of his INR, alternative anticoagulation discussed with patient and his significant other at bedside at this time patient wants to use Eliquis, instead of Coumadin.  INR is therapeutic today is 2.3, from 2.5 on 10/08/17, he did not take Coumadin on 10/09/2017,  may start Eliquis at 5 mg twice daily on 10/10/17 as I  anticipate that his INR will be at or below 2 on 10/10/17  3)AKI-please note that the patient only has one kidney, left kidney was surgically removed previously, patient is not clear of the etiology/diagnosis that led to left nephrectomy admission creatinine on admission was 1.9, from a baseline of 1.2, most likely due to dehydration in the setting of polyuria polydipsia associated with new onset DM and DKA, should improve with hydration, avoid nephrotoxic agents.  Creatinine is now down to 1.1.  4)Tobacco Abuse-smoking cessation advised, patient is interested in quitting, ok to use nicotine patch  5)H/o HTN-stop HCTZ given dehydration, d/c home on  Lisinopril 10 mg daily  NB!!!  At the time of admission on 10/08/17 patient met inpatient criteria due to diabetic ketoacidosis and acute kidney injury in the patient with only one solitary right kidney.  However with aggressive treatment protocols, insulin and aggressive IV fluid hydration patient's AKI resolved much faster than anticipated, patient's diabetic ketoacidosis resolved much quicker than  anticipated, overall patient's clinical condition improved above and beyond usual expected cause for this particular presentation.    Discharge Condition: Stable  Follow UP-with PCP in 2 weeks   Diet and Activity recommendation:  As advised  Discharge Instructions     Discharge Instructions    Call MD for:  difficulty breathing, headache or visual disturbances   Complete by:  As directed    Call MD for:  persistant dizziness or light-headedness   Complete by:  As directed    Call MD for:  persistant nausea and vomiting   Complete by:  As directed    Call MD for:  temperature >100.4   Complete by:  As directed    Diet Carb Modified   Complete by:  As directed    Discharge instructions   Complete by:  As directed    1)Diabetic  Diet with avoidance of simple sugars and avoidance of sweets advised 2)Start Eliquis 5 mg twice a day on 10/10/2017 in place of Coumadin for blood clot prevention 3)Avoid ibuprofen/Advil/Aleve/Motrin/Goody Powders/Naproxen/BC powders as these will make you more likely to bleed and can cause stomach ulcers 4)Take all other medications as prescribed and outlined to your discharge instructions 5)Please follow-up with your primary care doctor in 2-3 weeks for recheck, please take your diabetic/glucose diary recordings with you to this visit  6)Please avoid tobacco and alcohol 7)Further diabetic education including diabetic diet diet, diabetic lifestyle lifestyle and insulin therapy education advised as outpatient   Referral to Nutrition and Diabetes Services   Complete by:  As directed    Choose type of Diabetes Self-Management Training (DSMT) training services and number of hours requested:  Initial DSMT: 10 hours   Check all special needs that apply to patient requiring 1 on 1 DSMT:  Does not apply   DSMT Content:  Basic nutrition management   Choose the type of Medical Nutrition Therapy (MNT) and number of hours:  Initial MNT: 3 hours   FOR MEDICARE PATIENTS:  I hereby certify that I am managing this beneficiary's diabetes condition and that the above prescribed training is a necessary part of management.:  Does not apply       Discharge Medications     Allergies as of 10/09/2017      Reactions   Codeine Shortness Of Breath      Medication List    STOP taking these medications   cephALEXin 500 MG capsule Commonly known as:  KEFLEX   hydrochlorothiazide 25 MG tablet Commonly known as:  HYDRODIURIL   warfarin 10 MG tablet Commonly known as:  COUMADIN     TAKE these medications   acetaminophen 500 MG tablet Commonly known as:  TYLENOL Take 1 tablet (500 mg total) by mouth every 6 (six) hours as needed.   apixaban 5 MG Tabs tablet Commonly known as:  ELIQUIS Take 1 tablet (5 mg total) by mouth 2 (two) times daily. In place of Coumadin Start taking on:  10/10/2017   blood glucose meter kit and supplies Dispense  1 Relion Prime glucometer, 1 box of test strips, lancets, alcohol wipes and cotton balls. Use up to four times daily as directed. (FOR ICD-9 250.00, 250.01).   cyclobenzaprine 5 MG tablet Commonly known as:  FLEXERIL Take 1 tablet (5 mg total) by mouth 3 (three) times daily as needed for muscle spasms.   glimepiride 2 MG tablet Commonly known as:  AMARYL Take 1 tablet (2 mg total) by mouth 2 (two) times daily before a meal. Before Breakfast and Supper   glucose blood test strip Commonly known as:  RELION PRIME TEST Use as instructed to test blood sugar before meals and before bedtime   insulin glargine 100 UNIT/ML injection Commonly known as:  LANTUS Take 20 units every evening   lisinopril 10 MG tablet Commonly known as:  PRINIVIL,ZESTRIL Take 1 tablet (10 mg total) by mouth daily. For Blood Pressure and Kidney Protection   metFORMIN 850 MG tablet Commonly known as:  GLUCOPHAGE Take 1 tablet (850 mg total) by mouth 2 (two) times daily with a meal. Breakfast and Supper   nicotine 14 mg/24hr patch Commonly  known as:  NICODERM CQ - dosed in mg/24 hours Place 1 patch (14 mg total) onto the skin daily. Start taking on:  10/10/2017   RELION [place] 1 Device by Does not apply route as directed.  Glucometer       Major procedures and Radiology Reports - PLEASE review detailed and final reports for all details, in brief -   No results found.  Micro Results    Today   Subjective    Felicia Both today has no new complaints, his significant other is at bedside, questions answered,          Patient has been seen and examined prior to discharge   Objective   Blood pressure 113/79, pulse 82, temperature 98.6 F (37 C), temperature source Oral, resp. rate 16, height '5\' 10"'$  (1.778 m), weight 98.7 kg (217 lb 9.5 oz), SpO2 99 %.  No intake or output data in the 24 hours ending 10/09/17 1556  Exam  Physical Examination: General appearance - alert, well appearing, and in no distress and  Mental status - alert, oriented to person, place, and time, Eyes - sclera anicteric Neck - supple, no JVD elevation , Chest - clear  to auscultation bilaterally, symmetrical air movement,  Heart - S1 and S2 normal,  Abdomen - soft, nontender, nondistended, no masses or organomegaly Neurological - screening mental status exam normal, neck supple without rigidity, cranial nerves II through XII intact, DTR's normal and symmetric Extremities - no pedal edema noted, intact peripheral pulses  Skin - warm, dry Psych-affect is appropriate     Data Review   CBC w Diff:  Lab Results  Component Value Date   WBC 6.9 10/09/2017   HGB 14.5 10/09/2017   HCT 42.4 10/09/2017   PLT 195 10/09/2017   LYMPHOPCT 34 10/08/2017   MONOPCT 10 10/08/2017   EOSPCT 1 10/08/2017   BASOPCT 0 10/08/2017    CMP:  Lab Results  Component Value Date   NA 136 10/09/2017   K 3.7 10/09/2017   CL 103 10/09/2017   CO2 24 10/09/2017   BUN 13 10/09/2017   CREATININE 1.15 10/09/2017   PROT 7.0 06/08/2017    ALBUMIN 3.8 06/08/2017   BILITOT 0.6 06/08/2017   ALKPHOS 52 06/08/2017   AST 19 06/08/2017   ALT 16 (L) 06/08/2017  .   Total Discharge time is about 33 minutes  Roxan Hockey M.D on 10/09/2017 at 3:56 PM  [facility]   Office  [phone]  Voice Recognition Viviann Spare dictation system was used to create this note, attempts have been made to correct errors. Please contact the author with questions and/or clarifications.

## 2017-10-09 NOTE — Discharge Instructions (Signed)
1)Diabetic Diet with avoidance of simple sugars and avoidance of sweets advised 2)Start Eliquis 5 mg twice a day on 10/10/2017 in place of Coumadin for blood clot prevention 3)Avoid ibuprofen/Advil/Aleve/Motrin/Goody Powders/Naproxen/BC powders as these will make you more likely to bleed and can cause stomach ulcers 4)Take all other medications as prescribed and outlined to your discharge instructions 5)Please follow-up with your primary care doctor in 2-3 weeks for recheck, please take your diabetic/glucose diary recordings with you to this visit  6)Please avoid tobacco and alcohol 7)Further diabetic education including diabetic diet diet, diabetic lifestyle lifestyle and insulin therapy education advised as outpatient

## 2017-10-24 DIAGNOSIS — I10 Essential (primary) hypertension: Secondary | ICD-10-CM | POA: Diagnosis not present

## 2017-10-24 DIAGNOSIS — Z7901 Long term (current) use of anticoagulants: Secondary | ICD-10-CM | POA: Diagnosis not present

## 2017-10-24 DIAGNOSIS — E118 Type 2 diabetes mellitus with unspecified complications: Secondary | ICD-10-CM | POA: Diagnosis not present

## 2017-12-17 DIAGNOSIS — E1100 Type 2 diabetes mellitus with hyperosmolarity without nonketotic hyperglycemic-hyperosmolar coma (NKHHC): Secondary | ICD-10-CM | POA: Diagnosis not present

## 2017-12-17 DIAGNOSIS — E118 Type 2 diabetes mellitus with unspecified complications: Secondary | ICD-10-CM | POA: Diagnosis not present

## 2017-12-17 DIAGNOSIS — I10 Essential (primary) hypertension: Secondary | ICD-10-CM | POA: Diagnosis not present

## 2018-07-03 ENCOUNTER — Emergency Department (HOSPITAL_COMMUNITY)
Admission: EM | Admit: 2018-07-03 | Discharge: 2018-07-04 | Disposition: A | Discharge status: Home / Self Care | Payer: PRIVATE HEALTH INSURANCE | Attending: Emergency Medicine | Admitting: Emergency Medicine

## 2018-07-03 ENCOUNTER — Encounter (HOSPITAL_COMMUNITY): Payer: Self-pay | Admitting: *Deleted

## 2018-07-03 ENCOUNTER — Other Ambulatory Visit: Payer: Self-pay

## 2018-07-03 DIAGNOSIS — Z7984 Long term (current) use of oral hypoglycemic drugs: Secondary | ICD-10-CM | POA: Insufficient documentation

## 2018-07-03 DIAGNOSIS — Z79899 Other long term (current) drug therapy: Secondary | ICD-10-CM | POA: Insufficient documentation

## 2018-07-03 DIAGNOSIS — R319 Hematuria, unspecified: Secondary | ICD-10-CM | POA: Insufficient documentation

## 2018-07-03 DIAGNOSIS — Z7901 Long term (current) use of anticoagulants: Secondary | ICD-10-CM | POA: Insufficient documentation

## 2018-07-03 DIAGNOSIS — F17210 Nicotine dependence, cigarettes, uncomplicated: Secondary | ICD-10-CM | POA: Insufficient documentation

## 2018-07-03 LAB — URINALYSIS, ROUTINE W REFLEX MICROSCOPIC
Bilirubin Urine: NEGATIVE
Glucose, UA: NEGATIVE mg/dL
KETONES UR: NEGATIVE mg/dL
Leukocytes, UA: NEGATIVE
Nitrite: NEGATIVE
PROTEIN: 30 mg/dL — AB
Specific Gravity, Urine: 1.004 — ABNORMAL LOW (ref 1.005–1.030)
pH: 6 (ref 5.0–8.0)

## 2018-07-03 LAB — CBC WITH DIFFERENTIAL/PLATELET
ABS IMMATURE GRANULOCYTES: 0.01 10*3/uL (ref 0.00–0.07)
Basophils Absolute: 0 10*3/uL (ref 0.0–0.1)
Basophils Relative: 1 %
Eosinophils Absolute: 0.1 10*3/uL (ref 0.0–0.5)
Eosinophils Relative: 2 %
HCT: 47.2 % (ref 39.0–52.0)
HEMOGLOBIN: 15.6 g/dL (ref 13.0–17.0)
Immature Granulocytes: 0 %
LYMPHS ABS: 2.8 10*3/uL (ref 0.7–4.0)
LYMPHS PCT: 52 %
MCH: 31.8 pg (ref 26.0–34.0)
MCHC: 33.1 g/dL (ref 30.0–36.0)
MCV: 96.3 fL (ref 80.0–100.0)
MONO ABS: 0.4 10*3/uL (ref 0.1–1.0)
MONOS PCT: 7 %
NEUTROS ABS: 2 10*3/uL (ref 1.7–7.7)
Neutrophils Relative %: 38 %
Platelets: 182 10*3/uL (ref 150–400)
RBC: 4.9 MIL/uL (ref 4.22–5.81)
RDW: 12.5 % (ref 11.5–15.5)
WBC: 5.4 10*3/uL (ref 4.0–10.5)
nRBC: 0 % (ref 0.0–0.2)

## 2018-07-03 LAB — COMPREHENSIVE METABOLIC PANEL
ALT: 29 U/L (ref 0–44)
AST: 28 U/L (ref 15–41)
Albumin: 3.8 g/dL (ref 3.5–5.0)
Alkaline Phosphatase: 56 U/L (ref 38–126)
Anion gap: 8 (ref 5–15)
BUN: 15 mg/dL (ref 6–20)
CHLORIDE: 109 mmol/L (ref 98–111)
CO2: 21 mmol/L — AB (ref 22–32)
Calcium: 8.4 mg/dL — ABNORMAL LOW (ref 8.9–10.3)
Creatinine, Ser: 1.22 mg/dL (ref 0.61–1.24)
GFR calc Af Amer: >60 mL/min (ref >60)
GFR calc non Af Amer: >60 mL/min (ref >60)
GLUCOSE: 107 mg/dL — AB (ref 70–99)
POTASSIUM: 3.7 mmol/L (ref 3.5–5.1)
Sodium: 138 mmol/L (ref 135–145)
Total Bilirubin: 0.5 mg/dL (ref 0.3–1.2)
Total Protein: 7 g/dL (ref 6.5–8.1)

## 2018-07-03 LAB — PROTIME-INR
INR: 3.23
PROTHROMBIN TIME: 32.5 s — AB (ref 11.4–15.2)

## 2018-07-03 NOTE — ED Triage Notes (Signed)
HEMATURIA onset tonight, patient does take coumadin

## 2018-07-03 NOTE — ED Provider Notes (Signed)
[facility] EMERGENCY DEPARTMENT Provider Note   CSN: [id number] Arrival date & time: 07/03/18  2012     History   Chief Complaint Chief Complaint  Patient presents with  . Hematuria    HPI Gregory Andrews is a 57 y.o. 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  Patient reports that he was previously on Eliquis however when he went to the pharmacy 1 month ago to get it refilled the price it dramatically increased.  He contacted his doctor who recommended restarting his 10 mg daily warfarin.  He notes he has restarted this but has not got around to following up with the primary care doctor having his PT/INR checked.  He reports that this week he has been working 12-hour shifts and does admit to eating green leafy vegetables (spinach, etc).  He reports tonight when he was urinating he noticed some blood in his urine.  He denies episodes of this in the past.  There is no associated fever, chills, flank pain, abdominal pain, penile/testicular pain, penile/testicular trauma, dysuria, painful bowel movements, urinary frequency, urinary urgency, or history of kidney stones.    HPI  Past Medical History:  Diagnosis Date  . BPH (benign prostatic hyperplasia)   . Noncompliance 01/21/2012   Missed appointments and lab appointments  . Pulmonary embolism ([place])   . 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 Recurrent pulmonary embolism 01/21/2012   On life-long Coumadin  . Shoulder separation    left    Patient Active Problem List   Diagnosis Date Noted  . DKA (diabetic ketoacidosis) ([place]) 10/08/2017  . New onset type 2 diabetes mellitus ([place]) 10/08/2017  . Chronic anticoagulation for Recurrent DVT/PE 10/08/2017  . Tobacco abuse 10/08/2017  . Recurrent pulmonary embolism 01/21/2012  . 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 Noncompliance 01/21/2012     Past Surgical History:  Procedure Laterality Date  . left kidney removed    . right knee surgery          Home Medications    Prior to Admission medications   Medication Sig Start Date End Date Taking? Authorizing Provider  acetaminophen (TYLENOL) 500 MG tablet Take 1 tablet (500 mg total) by mouth every 6 (six) hours as needed. 06/08/17   Horton, Barbette Hair, MD  apixaban (ELIQUIS) 5 MG TABS tablet Take 1 tablet (5 mg total) by mouth 2 (two) times daily. In place of Coumadin 10/10/17   Roxan Hockey, MD  blood glucose meter kit and supplies Dispense  1 Relion Prime glucometer, 1 box of test strips, lancets, alcohol wipes and cotton balls. Use up to four times daily as directed. (FOR ICD-9 250.00, 250.01). 10/09/17   Roxan Hockey, MD  Blood Glucose Monitoring Suppl (RELION PRIME MONITOR) DEVI 1 Device by Does not apply route as directed. Glucometer 10/09/17   Roxan Hockey, MD  cyclobenzaprine (FLEXERIL) 5 MG tablet Take 1 tablet (5 mg total) by mouth 3 (three) times daily as needed for muscle spasms. 10/09/17   Roxan Hockey, MD  glimepiride (AMARYL) 2 MG tablet Take 1 tablet (2 mg total) by mouth 2 (two) times daily before a meal. Before Breakfast and Supper 10/09/17   Roxan Hockey, MD  glucose blood (RELION PRIME TEST) test strip Use as instructed to test blood sugar before meals and before bedtime 10/09/17   Emokpae, Courage, MD  insulin glargine (LANTUS) 100 UNIT/ML injection  Take 20 units every evening 10/09/17   Roxan Hockey, MD  lisinopril (PRINIVIL,ZESTRIL) 10 MG tablet Take 1 tablet (10 mg total) by mouth daily. For Blood Pressure and Kidney Protection 10/09/17 10/09/18  Roxan Hockey, MD  metFORMIN (GLUCOPHAGE) 850 MG tablet Take 1 tablet (850 mg total) by mouth 2 (two) times daily with a meal. Breakfast and Supper 10/09/17 10/09/18  Roxan Hockey, MD  nicotine (NICODERM CQ - DOSED IN MG/24 HOURS) 14 mg/24hr patch Place 1 patch (14 mg total) onto the skin daily.  10/10/17   Roxan Hockey, MD    Family History Family History  Problem Relation Age of Onset  . Anesthesia problems Neg Hx     Social History Social History   Tobacco Use  . Smoking status: Current Every Day Smoker    Types: Cigarettes  . Smokeless tobacco: Former Systems developer    Quit date: 01/12/2012  Substance Use Topics  . Alcohol use: Yes    Comment: beer  1 - 2 week  . Drug use: No     Allergies   Codeine   Review of Systems Review of Systems  All other systems reviewed and are negative.    Physical Exam Updated Vital Signs BP 113/79   Pulse 93   Temp 98.3 F (36.8 C)   Resp 18   Ht '5\' 10"'$  (1.778 m)   Wt 93 kg   SpO2 93%   BMI 29.41 kg/m   Physical Exam  Constitutional: He appears well-developed and well-nourished.  HENT:  Head: Normocephalic and atraumatic.  Right Ear: External ear normal.  Left Ear: External ear normal.  Nose: Nose normal.  Mouth/Throat: Uvula is midline, oropharynx is clear and moist and mucous membranes are normal. No tonsillar exudate.  Eyes: Pupils are equal, round, and reactive to light. Right eye exhibits no discharge. Left eye exhibits no discharge. No scleral icterus.  Neck: Trachea normal. Neck supple. No spinous process tenderness present. No neck rigidity. Normal range of motion present.  Cardiovascular: Normal rate, regular rhythm and intact distal pulses.  No murmur heard. Pulses:      Radial pulses are 2+ on the right side, and 2+ on the left side.       Dorsalis pedis pulses are 2+ on the right side, and 2+ on the left side.       Posterior tibial pulses are 2+ on the right side, and 2+ on the left side.  No lower extremity swelling or edema. Calves symmetric in size bilaterally.  Pulmonary/Chest: Effort normal and breath sounds normal. He exhibits no tenderness.  Abdominal: Soft. Bowel sounds are normal. There is no tenderness. There is no rigidity, no rebound, no guarding and no CVA tenderness.  Musculoskeletal: He  exhibits no edema.  Lymphadenopathy:    He has no cervical adenopathy.  Neurological: He is alert.  Skin: Skin is warm and dry. No rash noted. He is not diaphoretic.  Psychiatric: He has a normal mood and affect.  Nursing note and vitals reviewed.    ED Treatments / Results  Labs (all labs ordered are listed, but only abnormal results are displayed) Labs Reviewed  URINALYSIS, ROUTINE W REFLEX MICROSCOPIC - Abnormal; Notable for the following components:      Result Value   Color, Urine AMBER (*)    Specific Gravity, Urine 1.004 (*)    Hgb urine dipstick LARGE (*)    Protein, ur 30 (*)    Bacteria, UA RARE (*)    All other components within  normal limits  COMPREHENSIVE METABOLIC PANEL - Abnormal; Notable for the following components:   CO2 21 (*)    Glucose, Bld 107 (*)    Calcium 8.4 (*)    All other components within normal limits  PROTIME-INR - Abnormal; Notable for the following components:   Prothrombin Time 32.5 (*)    All other components within normal limits  URINE CULTURE  CBC WITH DIFFERENTIAL/PLATELET    EKG None  Radiology No results found.  Procedures Procedures (including critical care time)  Medications Ordered in ED Medications - No data to display   Initial Impression / Assessment and Plan / ED Course  I have reviewed the triage vital signs and the nursing notes.  Pertinent labs & imaging results that were available during my care of the patient were reviewed by me and considered in my medical decision making (see chart for details).      57 y.o. 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  Patient reports that he was previously on Eliquis however when he went to the pharmacy 1 month ago to get it refilled the price it dramatically increased.  He contacted his doctor who recommended restarting his 10 mg daily warfarin.  He notes he has restarted this but  has not got around to following up with the primary care doctor having his PT/INR checked.  He reports that this week he has been working 12-hour shifts and does admit to eating green leafy vegetables (spinach, etc).  He reports tonight when he was urinating he noticed some blood in his urine.  He denies episodes of this in the past.  There is no associated fever, chills, flank pain, abdominal pain, penile/testicular pain, penile/testicular trauma, dysuria, urinary frequency, painful bowel movements, urinary urgency, or history of kidney stones.    Exam is benign. VSS. Lab work reviewed.  No evidence of UTI.  Urine culture sent.  No evidence of anemia.  Creatinine within normal limits.  LFTs within normal limits.  INR 3.23.  Patient's goal is 2-3 per patient.  Discussed with my attending Dr. Dayna Barker.  He recommends patient hold 1 dose of his Coumadin and call PCP tomorrow to discuss dosing and follow-up.  No further work-up indicated.  Return precautions discussed.  Patient appears safe for discharge.  Final Clinical Impressions(s) / ED Diagnoses   Final diagnoses:  Hematuria, unspecified type    ED Discharge Orders    None       Lorelle Gibbs 07/03/18 2330    Mesner, Corene Cornea, MD 07/03/18 2332

## 2018-07-03 NOTE — Discharge Instructions (Signed)
You were seen here today for blood in your urine.  Your urine did not show signs of an infection. A culture was sent.  Your INR was noted to be 3.23

## 2018-07-05 LAB — URINE CULTURE: Culture: NO GROWTH

## 2020-04-29 ENCOUNTER — Encounter (HOSPITAL_COMMUNITY): Payer: Self-pay

## 2020-04-29 ENCOUNTER — Emergency Department (HOSPITAL_COMMUNITY)
Admission: EM | Admit: 2020-04-29 | Discharge: 2020-04-29 | Disposition: A | Discharge status: Home / Self Care | Payer: 59 | Attending: Emergency Medicine | Admitting: Emergency Medicine

## 2020-04-29 ENCOUNTER — Emergency Department (HOSPITAL_COMMUNITY): Payer: 59

## 2020-04-29 ENCOUNTER — Other Ambulatory Visit: Payer: Self-pay

## 2020-04-29 DIAGNOSIS — R1084 Generalized abdominal pain: Secondary | ICD-10-CM | POA: Diagnosis not present

## 2020-04-29 DIAGNOSIS — Z7901 Long term (current) use of anticoagulants: Secondary | ICD-10-CM | POA: Insufficient documentation

## 2020-04-29 DIAGNOSIS — Z794 Long term (current) use of insulin: Secondary | ICD-10-CM | POA: Diagnosis not present

## 2020-04-29 DIAGNOSIS — E1110 Type 2 diabetes mellitus with ketoacidosis without coma: Secondary | ICD-10-CM | POA: Insufficient documentation

## 2020-04-29 DIAGNOSIS — Z86711 Personal history of pulmonary embolism: Secondary | ICD-10-CM | POA: Insufficient documentation

## 2020-04-29 DIAGNOSIS — Z86718 Personal history of other venous thrombosis and embolism: Secondary | ICD-10-CM | POA: Insufficient documentation

## 2020-04-29 DIAGNOSIS — F17210 Nicotine dependence, cigarettes, uncomplicated: Secondary | ICD-10-CM | POA: Diagnosis not present

## 2020-04-29 LAB — LIPASE, BLOOD: Lipase: 28 U/L (ref 11–51)

## 2020-04-29 LAB — CBC WITH DIFFERENTIAL/PLATELET
Abs Immature Granulocytes: 0.01 10*3/uL (ref 0.00–0.07)
Basophils Absolute: 0 10*3/uL (ref 0.0–0.1)
Basophils Relative: 1 %
Eosinophils Absolute: 0 10*3/uL (ref 0.0–0.5)
Eosinophils Relative: 1 %
HCT: 47.6 % (ref 39.0–52.0)
Hemoglobin: 15.7 g/dL (ref 13.0–17.0)
Immature Granulocytes: 0 %
Lymphocytes Relative: 33 %
Lymphs Abs: 2.7 10*3/uL (ref 0.7–4.0)
MCH: 31.3 pg (ref 26.0–34.0)
MCHC: 33 g/dL (ref 30.0–36.0)
MCV: 95 fL (ref 80.0–100.0)
Monocytes Absolute: 0.9 10*3/uL (ref 0.1–1.0)
Monocytes Relative: 11 %
Neutro Abs: 4.5 10*3/uL (ref 1.7–7.7)
Neutrophils Relative %: 54 %
Platelets: 262 10*3/uL (ref 150–400)
RBC: 5.01 MIL/uL (ref 4.22–5.81)
RDW: 12.1 % (ref 11.5–15.5)
WBC: 8.2 10*3/uL (ref 4.0–10.5)
nRBC: 0 % (ref 0.0–0.2)

## 2020-04-29 LAB — URINALYSIS, ROUTINE W REFLEX MICROSCOPIC
Bacteria, UA: NONE SEEN
Bilirubin Urine: NEGATIVE
Glucose, UA: NEGATIVE mg/dL
Hgb urine dipstick: NEGATIVE
Ketones, ur: NEGATIVE mg/dL
Leukocytes,Ua: NEGATIVE
Nitrite: NEGATIVE
Protein, ur: 30 mg/dL — AB
Specific Gravity, Urine: 1.036 — ABNORMAL HIGH (ref 1.005–1.030)
pH: 5 (ref 5.0–8.0)

## 2020-04-29 LAB — COMPREHENSIVE METABOLIC PANEL
ALT: 14 U/L (ref 0–44)
AST: 15 U/L (ref 15–41)
Albumin: 3.9 g/dL (ref 3.5–5.0)
Alkaline Phosphatase: 51 U/L (ref 38–126)
Anion gap: 9 (ref 5–15)
BUN: 11 mg/dL (ref 6–20)
CO2: 25 mmol/L (ref 22–32)
Calcium: 9.2 mg/dL (ref 8.9–10.3)
Chloride: 102 mmol/L (ref 98–111)
Creatinine, Ser: 1.13 mg/dL (ref 0.61–1.24)
GFR calc Af Amer: >60 mL/min (ref >60)
GFR calc non Af Amer: >60 mL/min (ref >60)
Glucose, Bld: 146 mg/dL — ABNORMAL HIGH (ref 70–99)
Potassium: 4 mmol/L (ref 3.5–5.1)
Sodium: 136 mmol/L (ref 135–145)
Total Bilirubin: 0.7 mg/dL (ref 0.3–1.2)
Total Protein: 7.3 g/dL (ref 6.5–8.1)

## 2020-04-29 MED ORDER — DICYCLOMINE HCL 20 MG PO TABS: 20.0000 mg | ORAL_TABLET | Freq: Two times a day (BID) | ORAL | 0 refills | Status: DC | PRN

## 2020-04-29 MED ORDER — IOHEXOL 300 MG/ML  SOLN
100.0000 mL | Freq: Once | INTRAMUSCULAR | Status: AC | PRN
  Administered 2020-04-29: 100 mL via INTRAVENOUS

## 2020-04-29 MED ORDER — PANTOPRAZOLE SODIUM 20 MG PO TBEC: 20.0000 mg | DELAYED_RELEASE_TABLET | Freq: Every day | ORAL | 0 refills | Status: DC

## 2020-04-29 NOTE — ED Provider Notes (Signed)
[facility] EMERGENCY DEPARTMENT Provider Note   CSN: [id number] Arrival date & time: 04/29/20  0755     History Chief Complaint  Patient presents with  . Abdominal Pain    Gregory Andrews is a 59 y.o. male with a distant history of pulmonary embolism, no longer on Coumadin, diet controlled diabetes presenting with a 1 week history of generalized abdominal pain.  He describes intermittent episodes of burning, sometimes aching pain which starts in his bilateral upper abdomen and radiates down into his lower abdomen and also around to his lower back.  Does not seem to be triggered by eating meals, exertion, movements or positional changes.  He denies change in diet, no nausea or vomiting no diarrhea or constipation stating his last bowel movement was yesterday and normal.  He denies fevers or chills, also denies chest pain or shortness of breath.  His last episode occurred prior to arrival as he was leaving his job, works the night shift, but he is currently asymptomatic.  He has found no alleviators for symptoms.  HPI     Past Medical History:  Diagnosis Date  . BPH (benign prostatic hyperplasia)   . Noncompliance 01/21/2012   Missed appointments and lab appointments  . Pulmonary embolism ([place])   . 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 Recurrent pulmonary embolism 01/21/2012   On life-long Coumadin  . Shoulder separation    left    Patient Active Problem List   Diagnosis Date Noted  . DKA (diabetic ketoacidosis) ([place]) 10/08/2017  . New onset type 2 diabetes mellitus ([place]) 10/08/2017  . Chronic anticoagulation for Recurrent DVT/PE 10/08/2017  . Tobacco abuse 10/08/2017  . Recurrent pulmonary embolism 01/21/2012  . 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 Noncompliance 01/21/2012    Past Surgical History:  Procedure Laterality Date  . left kidney removed    . right knee surgery         Family History  Problem  Relation Age of Onset  . Anesthesia problems Neg Hx     Social History   Tobacco Use  . Smoking status: Current Every Day Smoker    Types: Cigarettes  . Smokeless tobacco: Former Systems developer    Quit date: 01/12/2012  Substance Use Topics  . Alcohol use: Not Currently    Comment: beer  1 - 2 week  . Drug use: No    Home Medications Prior to Admission medications   Medication Sig Start Date End Date Taking? Authorizing Provider  acetaminophen (TYLENOL) 500 MG tablet Take 1 tablet (500 mg total) by mouth every 6 (six) hours as needed. 06/08/17   Horton, Barbette Hair, MD  apixaban (ELIQUIS) 5 MG TABS tablet Take 1 tablet (5 mg total) by mouth 2 (two) times daily. In place of Coumadin 10/10/17   Roxan Hockey, MD  blood glucose meter kit and supplies Dispense  1 Relion Prime glucometer, 1 box of test strips, lancets, alcohol wipes and cotton balls. Use up to four times daily as directed. (FOR ICD-9 250.00, 250.01). 10/09/17   Roxan Hockey, MD  Blood Glucose Monitoring Suppl (RELION PRIME MONITOR) DEVI 1 Device by Does not apply route as directed. Glucometer 10/09/17   Roxan Hockey, MD  cyclobenzaprine (FLEXERIL) 5 MG tablet Take 1 tablet (5 mg total) by mouth 3 (three) times daily as needed for muscle spasms. 10/09/17   Roxan Hockey, MD  dicyclomine (BENTYL) 20 MG tablet Take 1 tablet (20 mg total) by mouth  2 (two) times daily as needed for spasms (abdominal pain). 04/29/20   Burlin Mcnair, Raynelle Fanning, PA-C  glimepiride (AMARYL) 2 MG tablet Take 1 tablet (2 mg total) by mouth 2 (two) times daily before a meal. Before Breakfast and Supper 10/09/17   Shon Hale, MD  glucose blood (RELION PRIME TEST) test strip Use as instructed to test blood sugar before meals and before bedtime 10/09/17   Shon Hale, MD  insulin glargine (LANTUS) 100 UNIT/ML injection Take 20 units every evening 10/09/17   Shon Hale, MD  lisinopril (PRINIVIL,ZESTRIL) 10 MG tablet Take 1 tablet (10 mg total) by mouth daily. For  Blood Pressure and Kidney Protection 10/09/17 10/09/18  Shon Hale, MD  metFORMIN (GLUCOPHAGE) 850 MG tablet Take 1 tablet (850 mg total) by mouth 2 (two) times daily with a meal. Breakfast and Supper 10/09/17 10/09/18  Shon Hale, MD  nicotine (NICODERM CQ - DOSED IN MG/24 HOURS) 14 mg/24hr patch Place 1 patch (14 mg total) onto the skin daily. 10/10/17   Shon Hale, MD  pantoprazole (PROTONIX) 20 MG tablet Take 1 tablet (20 mg total) by mouth daily. 04/29/20   Burgess Amor, PA-C    Allergies    Codeine  Review of Systems   Review of Systems  Constitutional: Negative for appetite change, chills and fever.  HENT: Negative for congestion and sore throat.   Eyes: Negative.   Respiratory: Negative for cough, chest tightness and shortness of breath.   Cardiovascular: Negative for chest pain, palpitations and leg swelling.  Gastrointestinal: Positive for abdominal pain. Negative for constipation, diarrhea, nausea and vomiting.  Genitourinary: Negative.   Musculoskeletal: Negative for arthralgias, joint swelling and neck pain.  Skin: Negative.  Negative for rash and wound.  Neurological: Negative for dizziness, weakness, light-headedness, numbness and headaches.  Psychiatric/Behavioral: Negative.     Physical Exam Updated Vital Signs BP 127/77 (BP Location: Left Arm)   Pulse 65   Temp 99 F (37.2 C) (Oral)   Resp 18   Ht 5\' 10"  (1.778 m)   Wt 93 kg   SpO2 100%   BMI 29.41 kg/m   Physical Exam Vitals and nursing note reviewed.  Constitutional:      Appearance: He is well-developed.  HENT:     Head: Normocephalic and atraumatic.  Eyes:     Conjunctiva/sclera: Conjunctivae normal.  Cardiovascular:     Rate and Rhythm: Normal rate and regular rhythm.     Heart sounds: Normal heart sounds.  Pulmonary:     Effort: Pulmonary effort is normal.     Breath sounds: Normal breath sounds. No wheezing.  Abdominal:     General: Abdomen is flat. Bowel sounds are increased.      Palpations: Abdomen is soft.     Tenderness: There is no abdominal tenderness. There is no right CVA tenderness or left CVA tenderness.     Hernia: A hernia is present. Hernia is present in the umbilical area.     Comments: Small soft periumbilical hernia which easily reduces.  Patient has no symptoms, specifically no abdominal pain at time of exam.  There is no guarding or rebound.  He does have hyperdynamic bowel sounds.  There is no increased tympany to percussion, no distention or mass.  Musculoskeletal:        General: Normal range of motion.     Cervical back: Normal range of motion.  Skin:    General: Skin is warm and dry.  Neurological:     General: No focal deficit present.  Mental Status: He is alert.     ED Results / Procedures / Treatments   Labs (all labs ordered are listed, but only abnormal results are displayed) Labs Reviewed  COMPREHENSIVE METABOLIC PANEL - Abnormal; Notable for the following components:      Result Value   Glucose, Bld 146 (*)    All other components within normal limits  URINALYSIS, ROUTINE W REFLEX MICROSCOPIC - Abnormal; Notable for the following components:   Specific Gravity, Urine 1.036 (*)    Protein, ur 30 (*)    All other components within normal limits  CBC WITH DIFFERENTIAL/PLATELET  LIPASE, BLOOD    EKG None  Radiology CT ABDOMEN PELVIS W CONTRAST  Result Date: 04/29/2020 CLINICAL DATA:  59 year old male with history of bilateral anterior abdominal pain for 1 week. History of left-sided nephrectomy. EXAM: CT ABDOMEN AND PELVIS WITH CONTRAST TECHNIQUE: Multidetector CT imaging of the abdomen and pelvis was performed using the standard protocol following bolus administration of intravenous contrast. CONTRAST:  185m OMNIPAQUE IOHEXOL 300 MG/ML  SOLN COMPARISON:  CT of the abdomen and pelvis 06/08/2017. FINDINGS: Lower chest: Marked elevation of the left hemidiaphragm. Hepatobiliary: Subcentimeter low-attenuation lesion in segment  3 of the liver. There is also an 8 mm intermediate attenuation lesion in segment 8 of the liver (axial image 22 of series 2) which is incompletely characterize, but stable in retrospect compared to the prior study from 2018, presumably a benign lesion. No other definite suspicious hepatic lesions. No intra or extrahepatic biliary ductal dilatation. Gallbladder is normal in appearance. Pancreas: No pancreatic mass. No pancreatic ductal dilatation. No pancreatic or peripancreatic fluid collections or inflammatory changes. Spleen: Unremarkable. Adrenals/Urinary Tract: Status post left radical nephrectomy. No unexpected soft tissue mass in the nephrectomy bed. Right kidney and bilateral adrenal glands are normal in appearance. No right hydroureteronephrosis. Urinary bladder is normal in appearance. Stomach/Bowel: Normal appearance of the stomach. No pathologic dilatation of small bowel or colon. Normal appendix. Vascular/Lymphatic: Aortic atherosclerosis, without evidence of aneurysm or dissection in the abdominal or pelvic vasculature. No lymphadenopathy noted in the abdomen or pelvis. Reproductive: Prostate gland and seminal vesicles are unremarkable in appearance. Other: Small umbilical hernia containing only omental fat. Left inguinal hernia containing predominantly fat. No significant volume of ascites. No pneumoperitoneum. Musculoskeletal: There are no aggressive appearing lytic or blastic lesions noted in the visualized portions of the skeleton. IMPRESSION: 1. No acute findings are noted in the abdomen or pelvis to account for the patient's symptoms. 2. Small umbilical hernia containing only omental fat. 3. Small left inguinal hernia containing fat. 4. Severe elevation of the left hemidiaphragm. 5. Status post left nephrectomy. 6. Aortic atherosclerosis. Electronically Signed   By: DVinnie LangtonM.D.   On: 04/29/2020 14:31    Procedures Procedures (including critical care time)  Medications Ordered in  ED Medications  iohexol (OMNIPAQUE) 300 MG/ML solution 100 mL (100 mLs Intravenous Contrast Given 04/29/20 1339)    ED Course  I have reviewed the triage vital signs and the nursing notes.  Pertinent labs & imaging results that were available during my care of the patient were reviewed by me and considered in my medical decision making (see chart for details).    MDM Rules/Calculators/A&P                          Labs and imaging reviewed and discussed with patient.  He does have some findings which are chronic in comparison to prior imaging  including a significantly elevated left hemidiaphragm, apparent benign and stable liver lesions since 2018 felt to be benign lesions.  He remains symptom-free during this ED evaluation.  Intermittent symptoms of unclear etiology.  There is no gallbladder findings on the CT image, specifically no acute cholecystitis, no evidence for gallstones.  His LFTs and lipase are normal.  There is an intermittent burning character to his pain, suggesting possible gastritis.  He was placed on Protonix, also prescribed Bentyl for as needed pain which may be intestinal colic.  He was advised close follow-up with his primary MD for recheck if symptoms persist, worsen or fail to resolve with this treatment plan.  The patient appears reasonably screened and/or stabilized for discharge and I doubt any other medical condition or other [facility] requiring further screening, evaluation, or treatment in the ED at this time prior to discharge.  Final Clinical Impression(s) / ED Diagnoses Final diagnoses:  Generalized abdominal pain    Rx / DC Orders ED Discharge Orders         Ordered    dicyclomine (BENTYL) 20 MG tablet  2 times daily PRN     Discontinue  Reprint     04/29/20 1508    pantoprazole (PROTONIX) 20 MG tablet  Daily     Discontinue  Reprint     04/29/20 1508           Evalee Jefferson, PA-C 04/29/20 1752    Milton Ferguson, MD 04/30/20 [phone]

## 2020-04-29 NOTE — Discharge Instructions (Addendum)
Your lab tests and your CT imaging today are negative for any acute source of your pain symptoms.  You have been prescribed a medication to help rule out acid reflux or gastritis, also Bentyl is a medication that can help you with intestinal spasm and may also help with these episodes of pain.  Plan a follow-up with Dr. Loleta Chance for recheck of your symptoms within the next week, especially if these medications are not relieving your symptoms.

## 2020-04-29 NOTE — ED Triage Notes (Signed)
Pt reports generalized abd pain radiating around to middle of back x 1 week.  Denies any n/v, denies diarrhea.  LBM was yesterday.

## 2020-04-29 NOTE — ED Notes (Signed)
Patient transported to CT 

## 2020-05-23 ENCOUNTER — Encounter (HOSPITAL_COMMUNITY): Payer: Self-pay

## 2020-05-23 ENCOUNTER — Other Ambulatory Visit: Payer: Self-pay

## 2020-05-23 DIAGNOSIS — Z20822 Contact with and (suspected) exposure to covid-19: Secondary | ICD-10-CM | POA: Insufficient documentation

## 2020-05-23 DIAGNOSIS — F17210 Nicotine dependence, cigarettes, uncomplicated: Secondary | ICD-10-CM | POA: Insufficient documentation

## 2020-05-23 DIAGNOSIS — E1110 Type 2 diabetes mellitus with ketoacidosis without coma: Secondary | ICD-10-CM | POA: Insufficient documentation

## 2020-05-23 DIAGNOSIS — K4030 Unilateral inguinal hernia, with obstruction, without gangrene, not specified as recurrent: Secondary | ICD-10-CM | POA: Diagnosis not present

## 2020-05-23 DIAGNOSIS — Z794 Long term (current) use of insulin: Secondary | ICD-10-CM | POA: Insufficient documentation

## 2020-05-23 DIAGNOSIS — Z79899 Other long term (current) drug therapy: Secondary | ICD-10-CM | POA: Insufficient documentation

## 2020-05-23 DIAGNOSIS — E119 Type 2 diabetes mellitus without complications: Secondary | ICD-10-CM | POA: Diagnosis not present

## 2020-05-23 DIAGNOSIS — R1030 Lower abdominal pain, unspecified: Secondary | ICD-10-CM | POA: Insufficient documentation

## 2020-05-23 NOTE — ED Triage Notes (Signed)
Pt presents to ED with small buldge to left lower abd/groin area that appeared after pt sneezed. Pt last BM was 4 hours ago.

## 2020-05-24 ENCOUNTER — Observation Stay (HOSPITAL_COMMUNITY)
Admission: EM | Admit: 2020-05-24 | Discharge: 2020-05-27 | Disposition: A | Discharge status: Home / Self Care | Payer: 59 | Attending: General Surgery | Admitting: General Surgery

## 2020-05-24 ENCOUNTER — Other Ambulatory Visit: Payer: Self-pay

## 2020-05-24 ENCOUNTER — Encounter (HOSPITAL_COMMUNITY): Payer: Self-pay

## 2020-05-24 ENCOUNTER — Emergency Department (HOSPITAL_COMMUNITY)
Admission: EM | Admit: 2020-05-24 | Discharge: 2020-05-24 | Disposition: A | Discharge status: Home / Self Care | Payer: 59 | Source: Home / Self Care | Attending: Emergency Medicine | Admitting: Emergency Medicine

## 2020-05-24 ENCOUNTER — Emergency Department (HOSPITAL_COMMUNITY): Payer: 59

## 2020-05-24 DIAGNOSIS — N400 Enlarged prostate without lower urinary tract symptoms: Secondary | ICD-10-CM

## 2020-05-24 DIAGNOSIS — Z86711 Personal history of pulmonary embolism: Secondary | ICD-10-CM

## 2020-05-24 DIAGNOSIS — K460 Unspecified abdominal hernia with obstruction, without gangrene: Secondary | ICD-10-CM

## 2020-05-24 DIAGNOSIS — E1169 Type 2 diabetes mellitus with other specified complication: Secondary | ICD-10-CM

## 2020-05-24 DIAGNOSIS — Z86718 Personal history of other venous thrombosis and embolism: Secondary | ICD-10-CM

## 2020-05-24 DIAGNOSIS — K420 Umbilical hernia with obstruction, without gangrene: Secondary | ICD-10-CM

## 2020-05-24 DIAGNOSIS — E119 Type 2 diabetes mellitus without complications: Secondary | ICD-10-CM | POA: Insufficient documentation

## 2020-05-24 DIAGNOSIS — K4030 Unilateral inguinal hernia, with obstruction, without gangrene, not specified as recurrent: Secondary | ICD-10-CM

## 2020-05-24 DIAGNOSIS — K219 Gastro-esophageal reflux disease without esophagitis: Secondary | ICD-10-CM

## 2020-05-24 DIAGNOSIS — K56609 Unspecified intestinal obstruction, unspecified as to partial versus complete obstruction: Secondary | ICD-10-CM | POA: Diagnosis present

## 2020-05-24 DIAGNOSIS — Z720 Tobacco use: Secondary | ICD-10-CM | POA: Diagnosis present

## 2020-05-24 DIAGNOSIS — E1165 Type 2 diabetes mellitus with hyperglycemia: Secondary | ICD-10-CM

## 2020-05-24 DIAGNOSIS — I10 Essential (primary) hypertension: Secondary | ICD-10-CM

## 2020-05-24 DIAGNOSIS — Z79899 Other long term (current) drug therapy: Secondary | ICD-10-CM | POA: Insufficient documentation

## 2020-05-24 DIAGNOSIS — Z20822 Contact with and (suspected) exposure to covid-19: Secondary | ICD-10-CM | POA: Insufficient documentation

## 2020-05-24 DIAGNOSIS — K4090 Unilateral inguinal hernia, without obstruction or gangrene, not specified as recurrent: Secondary | ICD-10-CM

## 2020-05-24 DIAGNOSIS — F17210 Nicotine dependence, cigarettes, uncomplicated: Secondary | ICD-10-CM | POA: Insufficient documentation

## 2020-05-24 HISTORY — DX: Unilateral inguinal hernia, without obstruction or gangrene, not specified as recurrent: K40.90

## 2020-05-24 LAB — URINALYSIS, ROUTINE W REFLEX MICROSCOPIC
Bilirubin Urine: NEGATIVE
Glucose, UA: NEGATIVE mg/dL
Hgb urine dipstick: NEGATIVE
Ketones, ur: NEGATIVE mg/dL
Leukocytes,Ua: NEGATIVE
Nitrite: NEGATIVE
Protein, ur: NEGATIVE mg/dL
Specific Gravity, Urine: 1.041 — ABNORMAL HIGH (ref 1.005–1.030)
pH: 5 (ref 5.0–8.0)

## 2020-05-24 LAB — CBC WITH DIFFERENTIAL/PLATELET
Abs Immature Granulocytes: 0.02 10*3/uL (ref 0.00–0.07)
Basophils Absolute: 0 10*3/uL (ref 0.0–0.1)
Basophils Relative: 0 %
Eosinophils Absolute: 0 10*3/uL (ref 0.0–0.5)
Eosinophils Relative: 0 %
HCT: 47.6 % (ref 39.0–52.0)
Hemoglobin: 15.6 g/dL (ref 13.0–17.0)
Immature Granulocytes: 0 %
Lymphocytes Relative: 17 %
Lymphs Abs: 1.2 10*3/uL (ref 0.7–4.0)
MCH: 30.8 pg (ref 26.0–34.0)
MCHC: 32.8 g/dL (ref 30.0–36.0)
MCV: 93.9 fL (ref 80.0–100.0)
Monocytes Absolute: 0.5 10*3/uL (ref 0.1–1.0)
Monocytes Relative: 7 %
Neutro Abs: 5.3 10*3/uL (ref 1.7–7.7)
Neutrophils Relative %: 76 %
Platelets: 234 10*3/uL (ref 150–400)
RBC: 5.07 MIL/uL (ref 4.22–5.81)
RDW: 12.1 % (ref 11.5–15.5)
WBC: 7 10*3/uL (ref 4.0–10.5)
nRBC: 0 % (ref 0.0–0.2)

## 2020-05-24 LAB — BASIC METABOLIC PANEL
Anion gap: 7 (ref 5–15)
BUN: 14 mg/dL (ref 6–20)
CO2: 26 mmol/L (ref 22–32)
Calcium: 9.3 mg/dL (ref 8.9–10.3)
Chloride: 104 mmol/L (ref 98–111)
Creatinine, Ser: 1.08 mg/dL (ref 0.61–1.24)
GFR calc Af Amer: >60 mL/min (ref >60)
GFR calc non Af Amer: >60 mL/min (ref >60)
Glucose, Bld: 176 mg/dL — ABNORMAL HIGH (ref 70–99)
Potassium: 4.3 mmol/L (ref 3.5–5.1)
Sodium: 137 mmol/L (ref 135–145)

## 2020-05-24 LAB — SARS CORONAVIRUS 2 BY RT PCR (HOSPITAL ORDER, PERFORMED IN ~~LOC~~ HOSPITAL LAB): SARS Coronavirus 2: NEGATIVE

## 2020-05-24 MED ORDER — PROPOFOL 10 MG/ML IV BOLUS
INTRAVENOUS | Status: AC | PRN
  Administered 2020-05-24 (×3): 40 mg via INTRAVENOUS

## 2020-05-24 MED ORDER — SODIUM CHLORIDE 0.9 % IV BOLUS
1000.0000 mL | Freq: Once | INTRAVENOUS | Status: AC
  Administered 2020-05-24: 1000 mL via INTRAVENOUS

## 2020-05-24 MED ORDER — PROPOFOL 10 MG/ML IV BOLUS
1.0000 mg/kg | Freq: Once | INTRAVENOUS | Status: DC
  Filled 2020-05-24: qty 20

## 2020-05-24 MED ORDER — HYDROCODONE-ACETAMINOPHEN 5-325 MG PO TABS: 1.0000 | ORAL_TABLET | Freq: Four times a day (QID) | ORAL | 0 refills | Status: DC | PRN

## 2020-05-24 MED ORDER — HYDROMORPHONE HCL 1 MG/ML IJ SOLN
1.0000 mg | Freq: Once | INTRAMUSCULAR | Status: AC
  Administered 2020-05-24: 1 mg via INTRAVENOUS
  Filled 2020-05-24: qty 1

## 2020-05-24 MED ORDER — IOHEXOL 300 MG/ML  SOLN
100.0000 mL | Freq: Once | INTRAMUSCULAR | Status: AC | PRN
  Administered 2020-05-24: 100 mL via INTRAVENOUS

## 2020-05-24 NOTE — ED Provider Notes (Signed)
[facility] EMERGENCY DEPARTMENT Provider Note   CSN: [id number] Arrival date & time: 05/23/20  2203     History Chief Complaint  Patient presents with  . Groin Pain    Gregory Andrews is a 59 y.o. male.  Patient is a 59 year old male with past medical history of type 2 diabetes, DVT/PE.  He presents today for evaluation of groin pain.  Patient was feeling well earlier this afternoon, then sneezed.  Afterward, he noticed pain and a bulge in his left groin.  He has not vomited, but feels nauseated.  He denies any fevers or chills.  He denies any history of inguinal hernia, but has had an umbilical hernia for quite some time.  The history is provided by the patient.  Groin Pain This is a new problem. The current episode started 1 to 2 hours ago. The problem occurs constantly. The problem has not changed since onset.Exacerbated by: Movement, palpation, and ambulation. Nothing relieves the symptoms. He has tried nothing for the symptoms.       Past Medical History:  Diagnosis Date  . BPH (benign prostatic hyperplasia)   . Noncompliance 01/21/2012   Missed appointments and lab appointments  . Pulmonary embolism ([place])   . 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 Recurrent pulmonary embolism 01/21/2012   On life-long Coumadin  . Shoulder separation    left    Patient Active Problem List   Diagnosis Date Noted  . DKA (diabetic ketoacidosis) ([place]) 10/08/2017  . New onset type 2 diabetes mellitus ([place]) 10/08/2017  . Chronic anticoagulation for Recurrent DVT/PE 10/08/2017  . Tobacco abuse 10/08/2017  . Recurrent pulmonary embolism 01/21/2012  . 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 Noncompliance 01/21/2012    Past Surgical History:  Procedure Laterality Date  . left kidney removed    . right knee surgery         Family History  Problem Relation Age of Onset  . Anesthesia problems Neg Hx     Social History    Tobacco Use  . Smoking status: Current Every Day Smoker    Types: Cigarettes  . Smokeless tobacco: Former Systems developer    Quit date: 01/12/2012  Substance Use Topics  . Alcohol use: Not Currently    Comment: beer  1 - 2 week  . Drug use: No    Home Medications Prior to Admission medications   Medication Sig Start Date End Date Taking? Authorizing Provider  acetaminophen (TYLENOL) 500 MG tablet Take 1 tablet (500 mg total) by mouth every 6 (six) hours as needed. 06/08/17   Horton, Barbette Hair, MD  apixaban (ELIQUIS) 5 MG TABS tablet Take 1 tablet (5 mg total) by mouth 2 (two) times daily. In place of Coumadin 10/10/17   Roxan Hockey, MD  blood glucose meter kit and supplies Dispense  1 Relion Prime glucometer, 1 box of test strips, lancets, alcohol wipes and cotton balls. Use up to four times daily as directed. (FOR ICD-9 250.00, 250.01). 10/09/17   Roxan Hockey, MD  Blood Glucose Monitoring Suppl (RELION PRIME MONITOR) DEVI 1 Device by Does not apply route as directed. Glucometer 10/09/17   Roxan Hockey, MD  cyclobenzaprine (FLEXERIL) 5 MG tablet Take 1 tablet (5 mg total) by mouth 3 (three) times daily as needed for muscle spasms. 10/09/17   Roxan Hockey, MD  dicyclomine (BENTYL) 20 MG tablet Take 1 tablet (20 mg total) by mouth 2 (two) times daily as needed for  spasms (abdominal pain). 04/29/20   Idol, Almyra Free, PA-C  glimepiride (AMARYL) 2 MG tablet Take 1 tablet (2 mg total) by mouth 2 (two) times daily before a meal. Before Breakfast and Supper 10/09/17   Roxan Hockey, MD  glucose blood (RELION PRIME TEST) test strip Use as instructed to test blood sugar before meals and before bedtime 10/09/17   Roxan Hockey, MD  insulin glargine (LANTUS) 100 UNIT/ML injection Take 20 units every evening 10/09/17   Roxan Hockey, MD  lisinopril (PRINIVIL,ZESTRIL) 10 MG tablet Take 1 tablet (10 mg total) by mouth daily. For Blood Pressure and Kidney Protection 10/09/17 10/09/18  Roxan Hockey, MD   metFORMIN (GLUCOPHAGE) 850 MG tablet Take 1 tablet (850 mg total) by mouth 2 (two) times daily with a meal. Breakfast and Supper 10/09/17 10/09/18  Roxan Hockey, MD  nicotine (NICODERM CQ - DOSED IN MG/24 HOURS) 14 mg/24hr patch Place 1 patch (14 mg total) onto the skin daily. 10/10/17   Roxan Hockey, MD  pantoprazole (PROTONIX) 20 MG tablet Take 1 tablet (20 mg total) by mouth daily. 04/29/20   Evalee Jefferson, PA-C    Allergies    Codeine  Review of Systems   Review of Systems  All other systems reviewed and are negative.   Physical Exam Updated Vital Signs BP (!) 142/89 (BP Location: Right Arm)   Pulse 65   Temp (!) 97.5 F (36.4 C) (Oral)   Resp 18   Ht _0  (1.778 m)   Wt 93 kg   SpO2 100%   BMI 29.41 kg/m   Physical Exam Vitals and nursing note reviewed.  Constitutional:      General: He is not in acute distress.    Appearance: He is well-developed. He is not diaphoretic.  HENT:     Head: Normocephalic and atraumatic.  Cardiovascular:     Rate and Rhythm: Normal rate and regular rhythm.     Heart sounds: No murmur heard.  No friction rub.  Pulmonary:     Effort: Pulmonary effort is normal. No respiratory distress.     Breath sounds: Normal breath sounds. No wheezing or rales.  Abdominal:     General: Bowel sounds are normal. There is no distension.     Palpations: Abdomen is soft.     Tenderness: There is no abdominal tenderness.     Hernia: A hernia is present.     Comments: There is a palpable hernia in the left inguinal region.  This is tender to palpation and not reducible.  Musculoskeletal:        General: Normal range of motion.     Cervical back: Normal range of motion and neck supple.  Skin:    General: Skin is warm and dry.  Neurological:     Mental Status: He is alert and oriented to person, place, and time.     Coordination: Coordination normal.     ED Results / Procedures / Treatments   Labs (all labs ordered are listed, but only  abnormal results are displayed) Labs Reviewed  BASIC METABOLIC PANEL  CBC WITH DIFFERENTIAL/PLATELET  URINALYSIS, ROUTINE W REFLEX MICROSCOPIC    EKG None  Radiology No results found.  Procedures .Sedation  Date/Time: 05/24/2020 6:04 AM Performed by: Veryl Speak, MD Authorized by: Veryl Speak, MD   Consent:    Consent obtained:  Verbal   Consent given by:  Patient   Risks discussed:  Allergic reaction, dysrhythmia, inadequate sedation, nausea, prolonged hypoxia resulting in organ damage, prolonged sedation  necessitating reversal, respiratory compromise necessitating ventilatory assistance and intubation and vomiting   Alternatives discussed:  Analgesia without sedation, anxiolysis and regional anesthesia Universal protocol:    Procedure explained and questions answered to patient or proxy's satisfaction: yes     Relevant documents present and verified: yes     Test results available and properly labeled: yes     Imaging studies available: yes     Required blood products, implants, devices, and special equipment available: yes     Site/side marked: yes     Immediately prior to procedure a time out was called: yes     Patient identity confirmation method:  Verbally with patient Indications:    Procedure necessitating sedation performed by:  Physician performing sedation Pre-sedation assessment:    Time since last food or drink:  8 hours   ASA classification: class 1 - normal, healthy patient     Neck mobility: normal     Mouth opening:  3 or more finger widths   Thyromental distance:  4 finger widths   Mallampati score:  I - soft palate, uvula, fauces, pillars visible   Pre-sedation assessments completed and reviewed: airway patency, cardiovascular function, hydration status, mental status, nausea/vomiting, pain level, respiratory function and temperature   Immediate pre-procedure details:    Reassessment: Patient reassessed immediately prior to procedure     Reviewed:  vital signs, relevant labs/tests and NPO status     Verified: bag valve mask available, emergency equipment available, intubation equipment available, IV patency confirmed, oxygen available and suction available   Procedure details (see MAR for exact dosages):    Preoxygenation:  Nasal cannula   Sedation:  Propofol   Intended level of sedation: deep   Intra-procedure monitoring:  Blood pressure monitoring, cardiac monitor, continuous pulse oximetry, frequent LOC assessments, frequent vital sign checks and continuous capnometry   Intra-procedure events: none     Total Provider sedation time (minutes):  10 Post-procedure details:    Attendance: Constant attendance by certified staff until patient recovered     Recovery: Patient returned to pre-procedure baseline     Post-sedation assessments completed and reviewed: airway patency, cardiovascular function, hydration status, mental status, nausea/vomiting, pain level, respiratory function and temperature     Patient is stable for discharge or admission: yes     Patient tolerance:  Tolerated well, no immediate complications Hernia reduction  Date/Time: 05/24/2020 6:05 AM Performed by: Veryl Speak, MD Authorized by: Veryl Speak, MD  Local anesthesia used: no  Anesthesia: Local anesthesia used: no  Sedation: Patient sedated: yes Sedation type: moderate (conscious) sedation Sedatives: propofol  Patient tolerance: patient tolerated the procedure well with no immediate complications Comments: The hernia was reduced under conscious sedation.  Patient tolerated this well and now states that his pain is significantly improved.  He no longer feels the pain in his abdomen.    (including critical care time)  Medications Ordered in ED Medications  sodium chloride 0.9 % bolus 1,000 mL (has no administration in time range)    ED Course  I have reviewed the triage vital signs and the nursing notes.  Pertinent labs & imaging results that  were available during my care of the patient were reviewed by me and considered in my medical decision making (see chart for details).    MDM Rules/Calculators/A&P  Patient is a 59 year old male presenting with complaints of left groin pain and swelling.  This began earlier today after sneezing.  On exam, patient has what appears to be an  inguinal hernia.  Laboratory studies show no white count, but CT scan shows an incarcerated inguinal hernia with early small bowel obstruction.    This finding was discussed with Dr. Arnoldo Morale from general surgery.  He has recommended conscious sedation and attempt at reduction.  This was performed using propofol and the hernia was reduced.  Patient has now waking up and states that his pain is much improved.  Patient to be evaluated by Dr. Arnoldo Morale this morning when he comes to rounds.  Final Clinical Impression(s) / ED Diagnoses Final diagnoses:  None    Rx / DC Orders ED Discharge Orders    None       Veryl Speak, MD 05/24/20 [phone]

## 2020-05-24 NOTE — Discharge Instructions (Addendum)
Take hydrocodone as prescribed as needed for pain.  Follow up with Dr. Lovell Sheehan in the General Surgery clinic early next week.  He is aware of your situation and wants to see you then.  Return to the ER if your symptoms significantly worsen or change.

## 2020-05-24 NOTE — ED Provider Notes (Signed)
[facility] EMERGENCY DEPARTMENT Provider Note   CSN: [id number] Arrival date & time: 05/24/20  2153     History Chief Complaint  Patient presents with  . Inguinal Hernia    Gregory Andrews is a 59 y.o. male.  HPI     This is a 59 year old male with a history of BPH, pulmonary embolism on Eliquis, inguinal hernia who presents with left groin pain and swelling consistent with his hernia.  He was seen and evaluated less than 24 hours ago for the same.  At that time he had an incarcerated hernia on exam.  He required conscious sedation for reduction.  This was undertaken because he is on Eliquis and would not be an immediate surgical candidate.  He states that he woke up this morning and went home.  He rested for a period of time.  He went out to get some food and medications.  He states he did have one normal bowel movement.  He has had some nausea but no vomiting.  He noted that when he woke up this evening he was having significant pain again and the hernia had "popped out."  He rates his pain 8 out of 10.  Pain did improve with pain medications provided but returned when they were off.  Labs and notes reviewed from yesterday.  Laboratory analysis reassuring including negative Covid testing.  Past Medical History:  Diagnosis Date  . BPH (benign prostatic hyperplasia)   . Inguinal hernia   . Noncompliance 01/21/2012   Missed appointments and lab appointments  . Pulmonary embolism ([place])   . 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 Recurrent pulmonary embolism 01/21/2012   On life-long Coumadin  . Shoulder separation    left    Patient Active Problem List   Diagnosis Date Noted  . DKA (diabetic ketoacidosis) ([place]) 10/08/2017  . New onset type 2 diabetes mellitus ([place]) 10/08/2017  . Chronic anticoagulation for Recurrent DVT/PE 10/08/2017  . Tobacco abuse 10/08/2017  . Recurrent pulmonary embolism 01/21/2012  . Recurrent DVT (deep venous  thrombosis) 01/21/2012  . Noncompliance 01/21/2012    Past Surgical History:  Procedure Laterality Date  . left kidney removed    . right knee surgery         Family History  Problem Relation Age of Onset  . Anesthesia problems Neg Hx     Social History   Tobacco Use  . Smoking status: Current Every Day Smoker    Types: Cigarettes  . Smokeless tobacco: Former Systems developer    Quit date: 01/12/2012  Substance Use Topics  . Alcohol use: Not Currently    Comment: beer  1 - 2 week  . Drug use: No    Home Medications Prior to Admission medications   Medication Sig Start Date End Date Taking? Authorizing Provider  acetaminophen (TYLENOL) 500 MG tablet Take 1 tablet (500 mg total) by mouth every 6 (six) hours as needed. 06/08/17   Wilkie Zenon, Barbette Hair, MD  apixaban (ELIQUIS) 5 MG TABS tablet Take 1 tablet (5 mg total) by mouth 2 (two) times daily. In place of Coumadin 10/10/17   Roxan Hockey, MD  blood glucose meter kit and supplies Dispense  1 Relion Prime glucometer, 1 box of test strips, lancets, alcohol wipes and cotton balls. Use up to four times daily as directed. (FOR ICD-9 250.00, 250.01). 10/09/17   Roxan Hockey, MD  Blood Glucose Monitoring Suppl (RELION PRIME MONITOR) DEVI 1 Device by Does not  apply route as directed. Glucometer 10/09/17   Roxan Hockey, MD  cyclobenzaprine (FLEXERIL) 5 MG tablet Take 1 tablet (5 mg total) by mouth 3 (three) times daily as needed for muscle spasms. 10/09/17   Roxan Hockey, MD  dicyclomine (BENTYL) 20 MG tablet Take 1 tablet (20 mg total) by mouth 2 (two) times daily as needed for spasms (abdominal pain). 04/29/20   Idol, Almyra Free, PA-C  glimepiride (AMARYL) 2 MG tablet Take 1 tablet (2 mg total) by mouth 2 (two) times daily before a meal. Before Breakfast and Supper 10/09/17   Roxan Hockey, MD  glucose blood (RELION PRIME TEST) test strip Use as instructed to test blood sugar before meals and before bedtime 10/09/17   Roxan Hockey, MD    HYDROcodone-acetaminophen (NORCO) 5-325 MG tablet Take 1-2 tablets by mouth every 6 (six) hours as needed. 05/24/20   Veryl Speak, MD  insulin glargine (LANTUS) 100 UNIT/ML injection Take 20 units every evening 10/09/17   Roxan Hockey, MD  lisinopril (PRINIVIL,ZESTRIL) 10 MG tablet Take 1 tablet (10 mg total) by mouth daily. For Blood Pressure and Kidney Protection 10/09/17 10/09/18  Roxan Hockey, MD  metFORMIN (GLUCOPHAGE) 850 MG tablet Take 1 tablet (850 mg total) by mouth 2 (two) times daily with a meal. Breakfast and Supper 10/09/17 10/09/18  Roxan Hockey, MD  nicotine (NICODERM CQ - DOSED IN MG/24 HOURS) 14 mg/24hr patch Place 1 patch (14 mg total) onto the skin daily. 10/10/17   Roxan Hockey, MD  pantoprazole (PROTONIX) 20 MG tablet Take 1 tablet (20 mg total) by mouth daily. 04/29/20   Evalee Jefferson, PA-C    Allergies    Codeine  Review of Systems   Review of Systems  Constitutional: Negative for fever.  Respiratory: Negative for shortness of breath.   Cardiovascular: Negative for chest pain.  Gastrointestinal: Positive for abdominal pain and nausea. Negative for constipation, diarrhea and vomiting.  All other systems reviewed and are negative.   Physical Exam Updated Vital Signs BP (!) 154/82 (BP Location: Right Arm)   Pulse 77   Temp 98.8 F (37.1 C) (Oral)   Ht 1.778 m ($Remove'5\' 10"'DDncnoW$ )   Wt 93 kg   SpO2 98%   BMI 29.42 kg/m   Physical Exam Vitals and nursing note reviewed.  Constitutional:      Appearance: He is well-developed. He is not ill-appearing.  HENT:     Head: Normocephalic and atraumatic.     Nose: Nose normal.     Mouth/Throat:     Mouth: Mucous membranes are moist.  Eyes:     Pupils: Pupils are equal, round, and reactive to light.  Cardiovascular:     Rate and Rhythm: Normal rate and regular rhythm.     Heart sounds: Normal heart sounds.  Pulmonary:     Effort: Pulmonary effort is normal. No respiratory distress.  Abdominal:     General: Bowel  sounds are normal.     Palpations: Abdomen is soft.     Tenderness: There is abdominal tenderness. There is no rebound.     Hernia: A hernia is present.     Comments: Bulge in the left inguinal region which is firm, significantly tender to palpation, no overlying skin changes  Musculoskeletal:     Cervical back: Neck supple.     Right lower leg: No edema.     Left lower leg: No edema.  Lymphadenopathy:     Cervical: No cervical adenopathy.  Skin:    General: Skin is warm and  dry.  Neurological:     Mental Status: He is alert and oriented to person, place, and time.  Psychiatric:        Mood and Affect: Mood normal.     ED Results / Procedures / Treatments   Labs (all labs ordered are listed, but only abnormal results are displayed) Labs Reviewed - No data to display  EKG None  Radiology CT ABDOMEN PELVIS W CONTRAST  Result Date: 05/24/2020 CLINICAL DATA:  Abdominal pain.  Hernia suspected.  LEFT groin bulge EXAM: CT ABDOMEN AND PELVIS WITH CONTRAST TECHNIQUE: Multidetector CT imaging of the abdomen and pelvis was performed using the standard protocol following bolus administration of intravenous contrast. CONTRAST:  138mL OMNIPAQUE IOHEXOL 300 MG/ML  SOLN COMPARISON:  CT 04/29/2020 FINDINGS: Lower chest: Lung bases are clear. Hepatobiliary: No focal hepatic lesion. No biliary duct dilatation. Common bile duct is normal. Pancreas: Pancreas is normal. No ductal dilatation. No pancreatic inflammation. Spleen: Normal spleen Adrenals/urinary tract: Adrenal glands normal. Post LEFT nephrectomy. RIGHT kidney enhances normally. Ureter and bladder normal. Stomach/Bowel: Stomach is normal. There is significant elevation of the LEFT hemidiaphragm. Duodenum is normal. Proximal small bowel/jejunum is normal. In the mid small bowel there is mild dilatation up to 3.3 cm. Loop of mid small bowel enters a LEFT inguinal hernia. The bowel loop entering the hernia sac measures 3 cm while the bowel loop  exiting measures 1.8 cm indicating obstruction. Approximately 3 cm of bowel within the hernia sac (91/2). Distal small bowel is collapsed. The colon is collapsed. No pneumatosis or intraperitoneal free air. No portal venous gas. Vascular/Lymphatic: Abdominal aorta is normal caliber. No periportal or retroperitoneal adenopathy. No pelvic adenopathy. Reproductive: Unremarkable Other: No free fluid. Musculoskeletal: No aggressive osseous lesion. IMPRESSION: 1. Early small bowel obstruction secondary to incarcerated LEFT inguinal hernia. 2. Post LEFT nephrectomy. 3. Elevation LEFT hemidiaphragm. Electronically Signed   By: Suzy Bouchard M.D.   On: 05/24/2020 04:35    Procedures Hernia reduction  Date/Time: 05/24/2020 11:59 PM Performed by: Merryl Hacker, MD Authorized by: Merryl Hacker, MD  Local anesthesia used: no  Anesthesia: Local anesthesia used: no  Sedation: Patient sedated: no  Patient tolerance: patient tolerated the procedure well with no immediate complications Comments: Patient was given pain medication and ice was placed on hernia.  Firm pressure was held for approximately 5 minutes with reduction of hernia.  Patient tolerated the procedure well.  Hernia did appear to recur but was soft and reducible.    (including critical care time)  Medications Ordered in ED Medications  HYDROmorphone (DILAUDID) injection 1 mg (1 mg Intravenous Given 05/24/20 2318)    ED Course  I have reviewed the triage vital signs and the nursing notes.  Pertinent labs & imaging results that were available during my care of the patient were reviewed by me and considered in my medical decision making (see chart for details).  Clinical Course as of May 25 10  Sun May 25, 2020  0009 Spoke with Dr. Arnoldo Morale, general surgery.  Given that the patient is on Eliquis, would need to wait a total of 2 days off of Eliquis for surgery.  Discussed with patient options including being admitted to the  hospital for possible surgery on Monday afternoon versus discharge and follow-up in surgery clinic on Tuesday morning for scheduled outpatient surgery.  Given the possibility that the hernia could recur and some ongoing discomfort, patient elected to be admitted to the hospital.  Per Dr. Arnoldo Morale, he does  not need to be n.p.o. until Sunday night.  He will see the patient first thing in the morning.  He is requesting hospitalist admission.   [CH]    Clinical Course User Index [CH] Opal Mckellips, Barbette Hair, MD   MDM Rules/Calculators/A&P                           Patient presents with recurrent incarcerated hernia.  Initially firm and very tender to palpation.  He was sedated within the last 24 hours for the same presentation to avoid surgery.  Do not feel that repeat sedation is in the patient's best distress at this time.  He was given pain medication and ice was applied.  Patient was very cooperative and I was able to reduce the hernia with approximately 5 minutes of firm pressure.  I have reviewed his chart from yesterday.  I will call general surgery as I feel that this is likely to recur if not admitted for surgery.  See clinical course above.  Plan for admission to the hospitalist.  Final Clinical Impression(s) / ED Diagnoses Final diagnoses:  Incarcerated hernia    Rx / DC Orders ED Discharge Orders    None       Juanpablo Ciresi, Barbette Hair, MD 05/25/20 0011

## 2020-05-24 NOTE — ED Notes (Signed)
Patient drowsy, patient is to call for ride due to moderate sedation before discharge.

## 2020-05-24 NOTE — ED Triage Notes (Signed)
Pt was seen here last night for inguinal hernia and dx with incarcerated hernia, conscious sedation done to push hernia back in place. Procedure was successful. Pt woke up this evening and noted that hernia had "popped out" again and is having pain.

## 2020-05-24 NOTE — ED Notes (Signed)
Pt asleep post conscious sedation but arouses to verbal stimuli. Will continue to monitor.

## 2020-05-25 DIAGNOSIS — I10 Essential (primary) hypertension: Secondary | ICD-10-CM | POA: Diagnosis not present

## 2020-05-25 DIAGNOSIS — Z720 Tobacco use: Secondary | ICD-10-CM

## 2020-05-25 DIAGNOSIS — E1165 Type 2 diabetes mellitus with hyperglycemia: Secondary | ICD-10-CM

## 2020-05-25 DIAGNOSIS — K4030 Unilateral inguinal hernia, with obstruction, without gangrene, not specified as recurrent: Secondary | ICD-10-CM | POA: Insufficient documentation

## 2020-05-25 DIAGNOSIS — K219 Gastro-esophageal reflux disease without esophagitis: Secondary | ICD-10-CM

## 2020-05-25 DIAGNOSIS — Z86711 Personal history of pulmonary embolism: Secondary | ICD-10-CM

## 2020-05-25 DIAGNOSIS — K4090 Unilateral inguinal hernia, without obstruction or gangrene, not specified as recurrent: Secondary | ICD-10-CM | POA: Diagnosis not present

## 2020-05-25 DIAGNOSIS — Z86718 Personal history of other venous thrombosis and embolism: Secondary | ICD-10-CM | POA: Diagnosis not present

## 2020-05-25 DIAGNOSIS — N400 Enlarged prostate without lower urinary tract symptoms: Secondary | ICD-10-CM

## 2020-05-25 DIAGNOSIS — E1169 Type 2 diabetes mellitus with other specified complication: Secondary | ICD-10-CM

## 2020-05-25 DIAGNOSIS — K56609 Unspecified intestinal obstruction, unspecified as to partial versus complete obstruction: Secondary | ICD-10-CM | POA: Diagnosis present

## 2020-05-25 LAB — CBC
HCT: 42.4 % (ref 39.0–52.0)
Hemoglobin: 13.9 g/dL (ref 13.0–17.0)
MCH: 31.1 pg (ref 26.0–34.0)
MCHC: 32.8 g/dL (ref 30.0–36.0)
MCV: 94.9 fL (ref 80.0–100.0)
Platelets: 190 10*3/uL (ref 150–400)
RBC: 4.47 MIL/uL (ref 4.22–5.81)
RDW: 12 % (ref 11.5–15.5)
WBC: 6.8 10*3/uL (ref 4.0–10.5)
nRBC: 0 % (ref 0.0–0.2)

## 2020-05-25 LAB — PROTIME-INR
INR: 1.1 (ref 0.8–1.2)
Prothrombin Time: 14.1 seconds (ref 11.4–15.2)

## 2020-05-25 LAB — CBG MONITORING, ED
Glucose-Capillary: 129 mg/dL — ABNORMAL HIGH (ref 70–99)
Glucose-Capillary: 218 mg/dL — ABNORMAL HIGH (ref 70–99)
Glucose-Capillary: 159 mg/dL — ABNORMAL HIGH (ref 70–99)

## 2020-05-25 LAB — HEMOGLOBIN A1C
Hgb A1c MFr Bld: 7.6 % — ABNORMAL HIGH (ref 4.8–5.6)
Mean Plasma Glucose: 171.42 mg/dL

## 2020-05-25 LAB — COMPREHENSIVE METABOLIC PANEL
ALT: 13 U/L (ref 0–44)
AST: 13 U/L — ABNORMAL LOW (ref 15–41)
Albumin: 3.3 g/dL — ABNORMAL LOW (ref 3.5–5.0)
Alkaline Phosphatase: 40 U/L (ref 38–126)
Anion gap: 6 (ref 5–15)
BUN: 13 mg/dL (ref 6–20)
CO2: 28 mmol/L (ref 22–32)
Calcium: 8.7 mg/dL — ABNORMAL LOW (ref 8.9–10.3)
Chloride: 102 mmol/L (ref 98–111)
Creatinine, Ser: 1.09 mg/dL (ref 0.61–1.24)
GFR calc Af Amer: >60 mL/min (ref >60)
GFR calc non Af Amer: >60 mL/min (ref >60)
Glucose, Bld: 143 mg/dL — ABNORMAL HIGH (ref 70–99)
Potassium: 3.8 mmol/L (ref 3.5–5.1)
Sodium: 136 mmol/L (ref 135–145)
Total Bilirubin: 0.9 mg/dL (ref 0.3–1.2)
Total Protein: 6 g/dL — ABNORMAL LOW (ref 6.5–8.1)

## 2020-05-25 LAB — HIV ANTIBODY (ROUTINE TESTING W REFLEX): HIV Screen 4th Generation wRfx: NONREACTIVE

## 2020-05-25 LAB — PHOSPHORUS: Phosphorus: 3.1 mg/dL (ref 2.5–4.6)

## 2020-05-25 LAB — APTT: aPTT: 31 seconds (ref 24–36)

## 2020-05-25 LAB — MAGNESIUM: Magnesium: 1.7 mg/dL (ref 1.7–2.4)

## 2020-05-25 MED ORDER — HYDROMORPHONE HCL 1 MG/ML IJ SOLN
0.5000 mg | INTRAMUSCULAR | Status: DC | PRN
  Administered 2020-05-25 – 2020-05-26 (×5): 0.5 mg via INTRAVENOUS
  Filled 2020-05-25: qty 1
  Filled 2020-05-25 (×2): qty 0.5
  Filled 2020-05-25: qty 1
  Filled 2020-05-25: qty 0.5

## 2020-05-25 MED ORDER — KCL IN DEXTROSE-NACL 20-5-0.45 MEQ/L-%-% IV SOLN
INTRAVENOUS | Status: DC
  Filled 2020-05-25 (×2): qty 1000

## 2020-05-25 MED ORDER — INSULIN ASPART 100 UNIT/ML ~~LOC~~ SOLN
0.0000 [IU] | Freq: Three times a day (TID) | SUBCUTANEOUS | Status: DC
  Filled 2020-05-25: qty 1

## 2020-05-25 MED ORDER — PANTOPRAZOLE SODIUM 20 MG PO TBEC
20.0000 mg | DELAYED_RELEASE_TABLET | Freq: Every day | ORAL | Status: DC
  Filled 2020-05-25 (×2): qty 1

## 2020-05-25 MED ORDER — CHLORHEXIDINE GLUCONATE CLOTH 2 % EX PADS
6.0000 | MEDICATED_PAD | Freq: Once | CUTANEOUS | Status: AC
  Administered 2020-05-25: 6 via TOPICAL

## 2020-05-25 MED ORDER — CHLORHEXIDINE GLUCONATE CLOTH 2 % EX PADS: 6.0000 | MEDICATED_PAD | Freq: Once | CUTANEOUS | Status: AC

## 2020-05-25 MED ORDER — LISINOPRIL 10 MG PO TABS
10.0000 mg | ORAL_TABLET | Freq: Every day | ORAL | Status: DC
  Administered 2020-05-25 – 2020-05-26 (×2): 10 mg via ORAL
  Filled 2020-05-25 (×2): qty 1

## 2020-05-25 MED ORDER — INSULIN ASPART 100 UNIT/ML ~~LOC~~ SOLN: 0.0000 [IU] | Freq: Every day | SUBCUTANEOUS | Status: DC

## 2020-05-25 MED ORDER — NICOTINE 14 MG/24HR TD PT24
14.0000 mg | MEDICATED_PATCH | Freq: Every day | TRANSDERMAL | Status: DC
  Filled 2020-05-25 (×2): qty 1

## 2020-05-25 MED ORDER — CEFAZOLIN SODIUM-DEXTROSE 2-4 GM/100ML-% IV SOLN
2.0000 g | INTRAVENOUS | Status: AC
  Administered 2020-05-26: 2 g via INTRAVENOUS
  Filled 2020-05-25 (×2): qty 100

## 2020-05-25 MED ORDER — PROMETHAZINE HCL 25 MG/ML IJ SOLN
12.5000 mg | Freq: Four times a day (QID) | INTRAMUSCULAR | Status: DC | PRN
  Filled 2020-05-25: qty 1

## 2020-05-25 MED ORDER — PANTOPRAZOLE SODIUM 40 MG PO TBEC
40.0000 mg | DELAYED_RELEASE_TABLET | Freq: Every day | ORAL | Status: DC
  Administered 2020-05-25: 40 mg via ORAL
  Filled 2020-05-25 (×2): qty 1

## 2020-05-25 NOTE — Progress Notes (Signed)
Per HPI: Gregory Andrews is a 59 y.o. male with medical history significant for GERD, hypertension, tobacco abuse bilateral lower extremity DVT and pulmonary embolism on Eliquis and inguinal hernia who presents to the emergency department due to left groin pain and swelling related to his hernia. Patient initially presented to the emergency department yesterday in the morning, was noted to to have an incarcerated hernia on exam at that time and this was reduced with conscious sedation. Patient was discharged home since he was on Eliquis and will require some time off Eliquis prior to surgical procedure. He states that he he went to get some medications of food while at home, he has had a bowel movement since being discharged from the ED, though he did have some nausea but no vomiting. Patient states that he went to bed this evening and on waking up, he was in significant pain and his hernia has protruded again. Pain was treated as 8/10 on pain scale and was slightly elevated with prescribed pain medication. He returned to the ED for further evaluation and management.  9/12: Patient has been admitted with small bowel obstruction in the setting of incarcerated left inguinal hernia with repeat reduction in the ED. He has been seen and evaluated at bedside this am. He denies any nausea or vomiting and states that he is passing flatus.  General surgery planning for potential hernia repair on 9/13 with time off from Eliquis that he takes for history of PE and bilateral lower extremity DVT.  He is also noted to have type 2 diabetes and hypertension which are both currently well controlled.  Plan to start diet today with n.p.o. after midnight.  Continue to hold Eliquis.  Total care time: 20 minutes.

## 2020-05-25 NOTE — H&P (View-Only) (Signed)
Reason for Consult: Incarcerated left inguinal hernia Referring Physician: Dr. Jessica Priest is an 59 y.o. male.  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  He states this is a new hernia and he does not know how long he has had it.  He had to present to the emergency room and after giving propofol, the hernia was reduced.  He was discharged home to follow-up in my office.  He subsequently returned yesterday evening with a recurrence of the hernia.  It again was reduced.  He last took his Eliquis yesterday.  Due to his recurrent nature of incarceration, it was elected to proceed with admission to the hospital while he is off Eliquis and proceed with left inguinal herniorrhaphy on 05/26/2020.  He currently has no significant left groin pain and denies any nausea or vomiting.  Past Medical History:  Diagnosis Date  . BPH (benign prostatic hyperplasia)   . Inguinal hernia   . Noncompliance 01/21/2012   Missed appointments and lab appointments  . Pulmonary embolism (HCC)   . 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 Recurrent pulmonary embolism 01/21/2012   On life-long Coumadin  . Shoulder separation    left    Past Surgical History:  Procedure Laterality Date  . left kidney removed    . right knee surgery      Family History  Problem Relation Age of Onset  . Anesthesia problems Neg Hx     Social History:  reports that he has been smoking cigarettes. He quit smokeless tobacco use about 8 years ago. He reports previous alcohol use. He reports that he does not use drugs.  Allergies:  Allergies  Allergen Reactions  . Codeine Shortness Of Breath    Medications: I have reviewed the patient's current medications.  Results for orders placed or performed during the hospital  encounter of 05/24/20 (from the past 48 hour(s))  Comprehensive metabolic panel     Status: Abnormal   Collection Time: 05/25/20  5:48 AM  Result Value Ref Range   Sodium 136 135 - 145 mmol/L   Potassium 3.8 3.5 - 5.1 mmol/L   Chloride 102 98 - 111 mmol/L   CO2 28 22 - 32 mmol/L   Glucose, Bld 143 (H) 70 - 99 mg/dL    Comment: Glucose reference range applies only to samples taken after fasting for at least 8 hours.   BUN 13 6 - 20 mg/dL   Creatinine, Ser 5.09 0.61 - 1.24 mg/dL   Calcium 8.7 (L) 8.9 - 10.3 mg/dL   Total Protein 6.0 (L) 6.5 - 8.1 g/dL   Albumin 3.3 (L) 3.5 - 5.0 g/dL   AST 13 (L) 15 - 41 U/L   ALT 13 0 - 44 U/L   Alkaline Phosphatase 40 38 - 126 U/L   Total Bilirubin 0.9 0.3 - 1.2 mg/dL   GFR calc non Af Amer >60 >60 mL/min   GFR calc Af Amer >60 >60 mL/min   Anion gap 6 5 - 15    Comment: Performed at [facility], [address] [place], [place] [address]  CBC     Status: None   Collection Time: 05/25/20  5:48 AM  Result Value Ref Range   WBC 6.8 4.0 - 10.5 K/uL   RBC 4.47 4.22 - 5.81  MIL/uL   Hemoglobin 13.9 13.0 - 17.0 g/dL   HCT 59.1 39 - 52 %   MCV 94.9 80.0 - 100.0 fL   MCH 31.1 26.0 - 34.0 pg   MCHC 32.8 30.0 - 36.0 g/dL   RDW 63.8 46.6 - 59.9 %   Platelets 190 150 - 400 K/uL   nRBC 0.0 0.0 - 0.2 %    Comment: Performed at [facility], [address] [place], [place] [address]  Protime-INR     Status: None   Collection Time: 05/25/20  5:48 AM  Result Value Ref Range   Prothrombin Time 14.1 11.4 - 15.2 seconds   INR 1.1 0.8 - 1.2    Comment: (NOTE) INR goal varies based on device and disease states. Performed at [facility], [address] [place], [place] [address]   APTT     Status: None   Collection Time: 05/25/20  5:48 AM  Result Value Ref Range   aPTT 31 24 - 36 seconds    Comment: Performed at [facility], [address], [place], [place] [address]  Magnesium     Status: None   Collection Time: 05/25/20  5:48 AM  Result Value Ref  Range   Magnesium 1.7 1.7 - 2.4 mg/dL    Comment: Performed at [facility], [address], [place], [place] [address]  Phosphorus     Status: None   Collection Time: 05/25/20  5:48 AM  Result Value Ref Range   Phosphorus 3.1 2.5 - 4.6 mg/dL    Comment: Performed at [facility], [address], [place], [place] [address]  CBG monitoring, ED     Status: Abnormal   Collection Time: 05/25/20  8:29 AM  Result Value Ref Range   Glucose-Capillary 129 (H) 70 - 99 mg/dL    Comment: Glucose reference range applies only to samples taken after fasting for at least 8 hours.    CT ABDOMEN PELVIS W CONTRAST  Result Date: 05/24/2020 CLINICAL DATA:  Abdominal pain.  Hernia suspected.  LEFT groin bulge EXAM: CT ABDOMEN AND PELVIS WITH CONTRAST TECHNIQUE: Multidetector CT imaging of the abdomen and pelvis was performed using the standard protocol following bolus administration of intravenous contrast. CONTRAST:  OMNIPAQUE IOHEXOL 300 MG/ML  SOLN COMPARISON:  CT 04/29/2020 FINDINGS: Lower chest: Lung bases are clear. Hepatobiliary: No focal hepatic lesion. No biliary duct dilatation. Common bile duct is normal. Pancreas: Pancreas is normal. No ductal dilatation. No pancreatic inflammation. Spleen: Normal spleen Adrenals/urinary tract: Adrenal glands normal. Post LEFT nephrectomy. RIGHT kidney enhances normally. Ureter and bladder normal. Stomach/Bowel: Stomach is normal. There is significant elevation of the LEFT hemidiaphragm. Duodenum is normal. Proximal small bowel/jejunum is normal. In the mid small bowel there is mild dilatation up to 3.3 cm. Loop of mid small bowel enters a LEFT inguinal hernia. The bowel loop entering the hernia sac measures 3 cm while the bowel loop exiting measures 1.8 cm indicating obstruction. Approximately 3 cm of bowel within the hernia sac (91/2). Distal small bowel is collapsed. The colon is collapsed. No pneumatosis or intraperitoneal free air. No portal venous gas.  Vascular/Lymphatic: Abdominal aorta is normal caliber. No periportal or retroperitoneal adenopathy. No pelvic adenopathy. Reproductive: Unremarkable Other: No free fluid. Musculoskeletal: No aggressive osseous lesion. IMPRESSION: 1. Early small bowel obstruction secondary to incarcerated LEFT inguinal hernia. 2. Post LEFT nephrectomy. 3. Elevation LEFT hemidiaphragm. Electronically Signed   By: Genevive Bi M.D.   On: 05/24/2020 04:35    ROS:  Pertinent  items are noted in HPI.  Blood pressure 127/89, pulse (!) 58, temperature 98.8 F (37.1 C), temperature source Oral, resp. rate 17, height 5\' 10"  (1.778 m), weight 93 kg, SpO2 94 %. Physical Exam: Pleasant black male no acute distress Head is normocephalic, atraumatic Lungs are clear to auscultation with equal breath sounds bilaterally Heart examination reveals regular rate and rhythm without S3, S4, murmurs Abdomen is soft.  He does have an umbilical hernia present.  No incarcerated left inguinal hernia at this time. Genitourinary examination is within normal limits  Assessment/Plan: Impression: Incarcerated left inguinal hernia, resolved.  Chronic anticoagulation for recurrent DVT/PE. Plan: We will proceed with left inguinal herniorrhaphy with mesh on 05/26/2020.  The risks and benefits of the procedure including bleeding, infection, mesh use, the possibility of recurrence and the possibility of clotting due to being off Eliquis temporarily were fully explained to the patient, who gave informed consent.  05/28/2020 05/25/2020, 9:58 AM

## 2020-05-25 NOTE — Consult Note (Signed)
Reason for Consult: Incarcerated left inguinal hernia Referring Physician: Dr. Jessica Priest is an 59 y.o. male.  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  He states this is a new hernia and he does not know how long he has had it.  He had to present to the emergency room and after giving propofol, the hernia was reduced.  He was discharged home to follow-up in my office.  He subsequently returned yesterday evening with a recurrence of the hernia.  It again was reduced.  He last took his Eliquis yesterday.  Due to his recurrent nature of incarceration, it was elected to proceed with admission to the hospital while he is off Eliquis and proceed with left inguinal herniorrhaphy on 05/26/2020.  He currently has no significant left groin pain and denies any nausea or vomiting.  Past Medical History:  Diagnosis Date  . BPH (benign prostatic hyperplasia)   . Inguinal hernia   . Noncompliance 01/21/2012   Missed appointments and lab appointments  . Pulmonary embolism (HCC)   . 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 Recurrent pulmonary embolism 01/21/2012   On life-long Coumadin  . Shoulder separation    left    Past Surgical History:  Procedure Laterality Date  . left kidney removed    . right knee surgery      Family History  Problem Relation Age of Onset  . Anesthesia problems Neg Hx     Social History:  reports that he has been smoking cigarettes. He quit smokeless tobacco use about 8 years ago. He reports previous alcohol use. He reports that he does not use drugs.  Allergies:  Allergies  Allergen Reactions  . Codeine Shortness Of Breath    Medications: I have reviewed the patient's current medications.  Results for orders placed or performed during the hospital  encounter of 05/24/20 (from the past 48 hour(s))  Comprehensive metabolic panel     Status: Abnormal   Collection Time: 05/25/20  5:48 AM  Result Value Ref Range   Sodium 136 135 - 145 mmol/L   Potassium 3.8 3.5 - 5.1 mmol/L   Chloride 102 98 - 111 mmol/L   CO2 28 22 - 32 mmol/L   Glucose, Bld 143 (H) 70 - 99 mg/dL    Comment: Glucose reference range applies only to samples taken after fasting for at least 8 hours.   BUN 13 6 - 20 mg/dL   Creatinine, Ser 5.09 0.61 - 1.24 mg/dL   Calcium 8.7 (L) 8.9 - 10.3 mg/dL   Total Protein 6.0 (L) 6.5 - 8.1 g/dL   Albumin 3.3 (L) 3.5 - 5.0 g/dL   AST 13 (L) 15 - 41 U/L   ALT 13 0 - 44 U/L   Alkaline Phosphatase 40 38 - 126 U/L   Total Bilirubin 0.9 0.3 - 1.2 mg/dL   GFR calc non Af Amer >60 >60 mL/min   GFR calc Af Amer >60 >60 mL/min   Anion gap 6 5 - 15    Comment: Performed at [facility], [address] [place], [place] [address]  CBC     Status: None   Collection Time: 05/25/20  5:48 AM  Result Value Ref Range   WBC 6.8 4.0 - 10.5 K/uL   RBC 4.47 4.22 - 5.81  MIL/uL   Hemoglobin 13.9 13.0 - 17.0 g/dL   HCT 59.1 39 - 52 %   MCV 94.9 80.0 - 100.0 fL   MCH 31.1 26.0 - 34.0 pg   MCHC 32.8 30.0 - 36.0 g/dL   RDW 63.8 46.6 - 59.9 %   Platelets 190 150 - 400 K/uL   nRBC 0.0 0.0 - 0.2 %    Comment: Performed at [facility], [address] [place], [place] [address]  Protime-INR     Status: None   Collection Time: 05/25/20  5:48 AM  Result Value Ref Range   Prothrombin Time 14.1 11.4 - 15.2 seconds   INR 1.1 0.8 - 1.2    Comment: (NOTE) INR goal varies based on device and disease states. Performed at [facility], [address] [place], [place] [address]   APTT     Status: None   Collection Time: 05/25/20  5:48 AM  Result Value Ref Range   aPTT 31 24 - 36 seconds    Comment: Performed at [facility], [address], [place], [place] [address]  Magnesium     Status: None   Collection Time: 05/25/20  5:48 AM  Result Value Ref  Range   Magnesium 1.7 1.7 - 2.4 mg/dL    Comment: Performed at [facility], [address], [place], [place] [address]  Phosphorus     Status: None   Collection Time: 05/25/20  5:48 AM  Result Value Ref Range   Phosphorus 3.1 2.5 - 4.6 mg/dL    Comment: Performed at [facility], [address], [place], [place] [address]  CBG monitoring, ED     Status: Abnormal   Collection Time: 05/25/20  8:29 AM  Result Value Ref Range   Glucose-Capillary 129 (H) 70 - 99 mg/dL    Comment: Glucose reference range applies only to samples taken after fasting for at least 8 hours.    CT ABDOMEN PELVIS W CONTRAST  Result Date: 05/24/2020 CLINICAL DATA:  Abdominal pain.  Hernia suspected.  LEFT groin bulge EXAM: CT ABDOMEN AND PELVIS WITH CONTRAST TECHNIQUE: Multidetector CT imaging of the abdomen and pelvis was performed using the standard protocol following bolus administration of intravenous contrast. CONTRAST:  OMNIPAQUE IOHEXOL 300 MG/ML  SOLN COMPARISON:  CT 04/29/2020 FINDINGS: Lower chest: Lung bases are clear. Hepatobiliary: No focal hepatic lesion. No biliary duct dilatation. Common bile duct is normal. Pancreas: Pancreas is normal. No ductal dilatation. No pancreatic inflammation. Spleen: Normal spleen Adrenals/urinary tract: Adrenal glands normal. Post LEFT nephrectomy. RIGHT kidney enhances normally. Ureter and bladder normal. Stomach/Bowel: Stomach is normal. There is significant elevation of the LEFT hemidiaphragm. Duodenum is normal. Proximal small bowel/jejunum is normal. In the mid small bowel there is mild dilatation up to 3.3 cm. Loop of mid small bowel enters a LEFT inguinal hernia. The bowel loop entering the hernia sac measures 3 cm while the bowel loop exiting measures 1.8 cm indicating obstruction. Approximately 3 cm of bowel within the hernia sac (91/2). Distal small bowel is collapsed. The colon is collapsed. No pneumatosis or intraperitoneal free air. No portal venous gas.  Vascular/Lymphatic: Abdominal aorta is normal caliber. No periportal or retroperitoneal adenopathy. No pelvic adenopathy. Reproductive: Unremarkable Other: No free fluid. Musculoskeletal: No aggressive osseous lesion. IMPRESSION: 1. Early small bowel obstruction secondary to incarcerated LEFT inguinal hernia. 2. Post LEFT nephrectomy. 3. Elevation LEFT hemidiaphragm. Electronically Signed   By: Genevive Bi M.D.   On: 05/24/2020 04:35    ROS:  Pertinent  items are noted in HPI.  Blood pressure 127/89, pulse (!) 58, temperature 98.8 F (37.1 C), temperature source Oral, resp. rate 17, height 5\' 10"  (1.778 m), weight 93 kg, SpO2 94 %. Physical Exam: Pleasant black male no acute distress Head is normocephalic, atraumatic Lungs are clear to auscultation with equal breath sounds bilaterally Heart examination reveals regular rate and rhythm without S3, S4, murmurs Abdomen is soft.  He does have an umbilical hernia present.  No incarcerated left inguinal hernia at this time. Genitourinary examination is within normal limits  Assessment/Plan: Impression: Incarcerated left inguinal hernia, resolved.  Chronic anticoagulation for recurrent DVT/PE. Plan: We will proceed with left inguinal herniorrhaphy with mesh on 05/26/2020.  The risks and benefits of the procedure including bleeding, infection, mesh use, the possibility of recurrence and the possibility of clotting due to being off Eliquis temporarily were fully explained to the patient, who gave informed consent.  05/28/2020 05/25/2020, 9:58 AM

## 2020-05-25 NOTE — H&P (Signed)
History and Physical  LESLIE LANGILLE MVH:[id number] DOB: [birth year] DOA: 05/24/2020  Referring physician: Merryl Hacker, MD PCP: Iona Beard, MD  Patient coming from: Home  Chief Complaint: Inguinal Hernia  HPI: Gregory Andrews is a 59 y.o. male with medical history significant for GERD, hypertension, tobacco abuse bilateral lower extremity DVT and pulmonary embolism on Eliquis and inguinal hernia who presents to the emergency department due to left groin pain and swelling related to his hernia. Patient initially presented to the emergency department yesterday in the morning, he was noted to to have an incarcerated hernia on exam at that time and this was reduced with conscious sedation. Patient was discharged home since he was on Eliquis and will require some time off Eliquis prior to surgical procedure. He states that he he went to get some medications of food while at home, he has had a bowel movement since being discharged from the ED, though he did have some nausea but no vomiting. Patient states that he went to bed this evening and on waking up, he was in significant pain and his hernia has protruded again. Pain was treated as 8/10 on pain scale and was slightly elevated with prescribed pain medication. He returned to the ED for further evaluation and management.  ED Course:  In the emergency department, he was hemodynamically stable. Work-up in the ED showed normal CBC and hyperglycemia, SARS coronavirus 2 was negative (05/23/2009). Urinalysis was negative for UTI. CT abdomen and  pelvis with contrast showed only small bowel obstruction secondary to incarcerated left inguinal hernia. He was treated with IV Dilaudid 1 mg x 1. Hospitalist was asked to admit patient for further evaluation and management.  Review of Systems: Constitutional: Negative for chills and fever.  HENT: Negative for ear pain and sore throat.   Eyes: Negative for pain and visual disturbance.  Respiratory:  Negative for cough, chest tightness and shortness of breath.   Cardiovascular: Negative for chest pain and palpitations.  Gastrointestinal: Positive for nausea and abdominal pain Endocrine: Negative for polyphagia and polyuria.  Genitourinary: Negative for decreased urine volume, dysuria Musculoskeletal: Negative for arthralgias and back pain.  Skin: Negative for color change and rash.  Allergic/Immunologic: Negative for immunocompromised state.  Neurological: Negative for tremors, syncope, speech difficulty, weakness, light-headedness and headaches.  Hematological: Does not bruise/bleed easily.  All other systems reviewed and are negative    Past Medical History:  Diagnosis Date  . BPH (benign prostatic hyperplasia)   . Inguinal hernia   . Noncompliance 01/21/2012   Missed appointments and lab appointments  . Pulmonary embolism ([place])   . 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 Recurrent pulmonary embolism 01/21/2012   On life-long Coumadin  . Shoulder separation    left   Past Surgical History:  Procedure Laterality Date  . left kidney removed    . right knee surgery      Social History:  reports that he has been smoking cigarettes. He quit smokeless tobacco use about 8 years ago. He reports previous alcohol use. He reports that he does not use drugs.   Allergies  Allergen Reactions  . Codeine Shortness Of Breath    Family History  Problem Relation Age of Onset  . Anesthesia problems Neg Hx     Prior to Admission medications   Medication Sig Start Date End Date Taking? Authorizing Provider  acetaminophen (TYLENOL) 500 MG tablet Take 1 tablet (500 mg total) by mouth every 6 (  six) hours as needed. 06/08/17   Horton, Barbette Hair, MD  apixaban (ELIQUIS) 5 MG TABS tablet Take 1 tablet (5 mg total) by mouth 2 (two) times daily. In place of Coumadin 10/10/17   Roxan Hockey, MD  blood glucose meter kit and supplies Dispense  1 Relion Prime  glucometer, 1 box of test strips, lancets, alcohol wipes and cotton balls. Use up to four times daily as directed. (FOR ICD-9 250.00, 250.01). 10/09/17   Roxan Hockey, MD  Blood Glucose Monitoring Suppl (RELION PRIME MONITOR) DEVI 1 Device by Does not apply route as directed. Glucometer 10/09/17   Roxan Hockey, MD  cyclobenzaprine (FLEXERIL) 5 MG tablet Take 1 tablet (5 mg total) by mouth 3 (three) times daily as needed for muscle spasms. 10/09/17   Roxan Hockey, MD  dicyclomine (BENTYL) 20 MG tablet Take 1 tablet (20 mg total) by mouth 2 (two) times daily as needed for spasms (abdominal pain). 04/29/20   Idol, Almyra Free, PA-C  glimepiride (AMARYL) 2 MG tablet Take 1 tablet (2 mg total) by mouth 2 (two) times daily before a meal. Before Breakfast and Supper 10/09/17   Roxan Hockey, MD  glucose blood (RELION PRIME TEST) test strip Use as instructed to test blood sugar before meals and before bedtime 10/09/17   Roxan Hockey, MD  HYDROcodone-acetaminophen (NORCO) 5-325 MG tablet Take 1-2 tablets by mouth every 6 (six) hours as needed. 05/24/20   Veryl Speak, MD  insulin glargine (LANTUS) 100 UNIT/ML injection Take 20 units every evening 10/09/17   Roxan Hockey, MD  lisinopril (PRINIVIL,ZESTRIL) 10 MG tablet Take 1 tablet (10 mg total) by mouth daily. For Blood Pressure and Kidney Protection 10/09/17 10/09/18  Roxan Hockey, MD  metFORMIN (GLUCOPHAGE) 850 MG tablet Take 1 tablet (850 mg total) by mouth 2 (two) times daily with a meal. Breakfast and Supper 10/09/17 10/09/18  Roxan Hockey, MD  nicotine (NICODERM CQ - DOSED IN MG/24 HOURS) 14 mg/24hr patch Place 1 patch (14 mg total) onto the skin daily. 10/10/17   Roxan Hockey, MD  pantoprazole (PROTONIX) 20 MG tablet Take 1 tablet (20 mg total) by mouth daily. 04/29/20   Evalee Jefferson, PA-C    Physical Exam: BP (!) 154/82 (BP Location: Right Arm)   Pulse 77   Temp 98.8 F (37.1 C) (Oral)   Ht $R'5\' 10"'nF$  (1.778 m)   Wt 93 kg   SpO2 98%    BMI 29.42 kg/m   . General: 59 y.o. year-old male well developed well nourished in no acute distress.  Alert and oriented x3. Marland Kitchen HEENT: NCAT, EOMI . Neck: Supple, trachea medial . Cardiovascular: Regular rate and rhythm with no rubs or gallops.  No thyromegaly or JVD noted.  No lower extremity edema. 2/4 pulses in all 4 extremities. Marland Kitchen Respiratory: Clear to auscultation with no wheezes or rales. Good inspiratory effort. . Abdomen: Soft, LLQ tenderness with normal bowel sounds x4 quadrants. Left inguinal bulging due to hernia noted . Muskuloskeletal: No cyanosis, clubbing or edema noted bilaterally . Neuro: CN II-XII intact, strength, sensation, reflexes . Skin: No ulcerative lesions noted or rashes . Psychiatry: Judgement and insight appear normal. Mood is appropriate for condition and setting          Labs on Admission:  Basic Metabolic Panel: Recent Labs  Lab 05/24/20 0325  NA 137  K 4.3  CL 104  CO2 26  GLUCOSE 176*  BUN 14  CREATININE 1.08  CALCIUM 9.3   Liver Function Tests: No results for input(s):  AST, ALT, ALKPHOS, BILITOT, PROT, ALBUMIN in the last 168 hours. No results for input(s): LIPASE, AMYLASE in the last 168 hours. No results for input(s): AMMONIA in the last 168 hours. CBC: Recent Labs  Lab 05/24/20 0325  WBC 7.0  NEUTROABS 5.3  HGB 15.6  HCT 47.6  MCV 93.9  PLT 234   Cardiac Enzymes: No results for input(s): CKTOTAL, CKMB, CKMBINDEX, TROPONINI in the last 168 hours.  BNP (last 3 results) No results for input(s): BNP in the last 8760 hours.  ProBNP (last 3 results) No results for input(s): PROBNP in the last 8760 hours.  CBG: No results for input(s): GLUCAP in the last 168 hours.  Radiological Exams on Admission: CT ABDOMEN PELVIS W CONTRAST  Result Date: 05/24/2020 CLINICAL DATA:  Abdominal pain.  Hernia suspected.  LEFT groin bulge EXAM: CT ABDOMEN AND PELVIS WITH CONTRAST TECHNIQUE: Multidetector CT imaging of the abdomen and pelvis was  performed using the standard protocol following bolus administration of intravenous contrast. CONTRAST:  138mL OMNIPAQUE IOHEXOL 300 MG/ML  SOLN COMPARISON:  CT 04/29/2020 FINDINGS: Lower chest: Lung bases are clear. Hepatobiliary: No focal hepatic lesion. No biliary duct dilatation. Common bile duct is normal. Pancreas: Pancreas is normal. No ductal dilatation. No pancreatic inflammation. Spleen: Normal spleen Adrenals/urinary tract: Adrenal glands normal. Post LEFT nephrectomy. RIGHT kidney enhances normally. Ureter and bladder normal. Stomach/Bowel: Stomach is normal. There is significant elevation of the LEFT hemidiaphragm. Duodenum is normal. Proximal small bowel/jejunum is normal. In the mid small bowel there is mild dilatation up to 3.3 cm. Loop of mid small bowel enters a LEFT inguinal hernia. The bowel loop entering the hernia sac measures 3 cm while the bowel loop exiting measures 1.8 cm indicating obstruction. Approximately 3 cm of bowel within the hernia sac (91/2). Distal small bowel is collapsed. The colon is collapsed. No pneumatosis or intraperitoneal free air. No portal venous gas. Vascular/Lymphatic: Abdominal aorta is normal caliber. No periportal or retroperitoneal adenopathy. No pelvic adenopathy. Reproductive: Unremarkable Other: No free fluid. Musculoskeletal: No aggressive osseous lesion. IMPRESSION: 1. Early small bowel obstruction secondary to incarcerated LEFT inguinal hernia. 2. Post LEFT nephrectomy. 3. Elevation LEFT hemidiaphragm. Electronically Signed   By: Suzy Bouchard M.D.   On: 05/24/2020 04:35    EKG: I independently viewed the EKG done and my findings are as followed: EKG was not done in the ED  Assessment/Plan Present on Admission: . Small bowel obstruction ([place]) . Tobacco abuse  Principal Problem:   Left inguinal hernia Active Problems:   Tobacco abuse   Small bowel obstruction ([facility])   Essential hypertension   BPH (benign prostatic hyperplasia)   GERD  (gastroesophageal reflux disease)   History of pulmonary embolism   History of DVT (deep vein thrombosis)   Hyperglycemia due to diabetes mellitus ([facility])   Small bowel obstruction in the setting of incarcerated left inguinal hernia CT abdomen pelvis with contrast showed only small bowel obstruction secondary to incarcerated left inguinal hernia Inguinal hernia was again reduced in the ED General surgery (Dr. Arnoldo Morale) was consulted per ED physician with plan to possibly repair patient's hernia on Monday (9/13), pending enough time off Eliquis prior to surgical procedure Patient is currently without any pain Patient will be placed n.p.o. at midnight Continue Phenergan p.r.n. for nausea/vomiting  Hyperglycemia secondary to type 2 diabetes mellitus Continue insulin sliding scale and hypoglycemia protocol  History of PE and bilateral lower extremity DVT Eliquis currently held due to impending surgical procedure  GERD Continue Protonix  Essential hypertension  Continue home lisinopril  Tobacco abuse Patient counseled on tobacco abuse cessation Continue nicotine patch  DVT prophylaxis: Eliquis currently on hold due to impending surgical procedure, patient with bilateral lower extremity DVT  Code Status: Full code  Family Communication: None at bedside  Disposition Plan:  Patient is from:                        home Anticipated DC to:                   SNF or family members home Anticipated DC date:               2-3 days Anticipated DC barriers:          Unstable to discharge at this time due to small bowel obstruction secondary to incarcerated inguinal hernia pending possible surgical procedure.   Consults called: General surgery  Admission status: Inpatient    Bernadette Hoit MD [facility]  If 7PM-7AM, please contact night-coverage [email or web address] Password [facility]  05/25/2020, 4:15 AM

## 2020-05-26 ENCOUNTER — Inpatient Hospital Stay (HOSPITAL_COMMUNITY): Payer: 59 | Admitting: Anesthesiology

## 2020-05-26 ENCOUNTER — Encounter (HOSPITAL_COMMUNITY): Payer: Self-pay | Admitting: Internal Medicine

## 2020-05-26 ENCOUNTER — Encounter (HOSPITAL_COMMUNITY)
Admission: EM | Disposition: A | Discharge status: Home / Self Care | Payer: Self-pay | Source: Home / Self Care | Attending: Emergency Medicine

## 2020-05-26 DIAGNOSIS — K4090 Unilateral inguinal hernia, without obstruction or gangrene, not specified as recurrent: Secondary | ICD-10-CM | POA: Diagnosis not present

## 2020-05-26 DIAGNOSIS — K460 Unspecified abdominal hernia with obstruction, without gangrene: Secondary | ICD-10-CM

## 2020-05-26 DIAGNOSIS — K4030 Unilateral inguinal hernia, with obstruction, without gangrene, not specified as recurrent: Secondary | ICD-10-CM | POA: Diagnosis not present

## 2020-05-26 DIAGNOSIS — K420 Umbilical hernia with obstruction, without gangrene: Secondary | ICD-10-CM | POA: Diagnosis not present

## 2020-05-26 HISTORY — PX: UMBILICAL HERNIA REPAIR: SHX196

## 2020-05-26 HISTORY — PX: INGUINAL HERNIA REPAIR: SHX194

## 2020-05-26 LAB — BASIC METABOLIC PANEL
Anion gap: 7 (ref 5–15)
BUN: 13 mg/dL (ref 6–20)
CO2: 30 mmol/L (ref 22–32)
Calcium: 8.8 mg/dL — ABNORMAL LOW (ref 8.9–10.3)
Chloride: 100 mmol/L (ref 98–111)
Creatinine, Ser: 1.1 mg/dL (ref 0.61–1.24)
GFR calc Af Amer: >60 mL/min (ref >60)
GFR calc non Af Amer: >60 mL/min (ref >60)
Glucose, Bld: 142 mg/dL — ABNORMAL HIGH (ref 70–99)
Potassium: 3.8 mmol/L (ref 3.5–5.1)
Sodium: 137 mmol/L (ref 135–145)

## 2020-05-26 LAB — GLUCOSE, CAPILLARY
Glucose-Capillary: 178 mg/dL — ABNORMAL HIGH (ref 70–99)
Glucose-Capillary: 144 mg/dL — ABNORMAL HIGH (ref 70–99)
Glucose-Capillary: 146 mg/dL — ABNORMAL HIGH (ref 70–99)
Glucose-Capillary: 99 mg/dL (ref 70–99)
Glucose-Capillary: 98 mg/dL (ref 70–99)

## 2020-05-26 LAB — CBC
HCT: 42.5 % (ref 39.0–52.0)
Hemoglobin: 14 g/dL (ref 13.0–17.0)
MCH: 31.3 pg (ref 26.0–34.0)
MCHC: 32.9 g/dL (ref 30.0–36.0)
MCV: 95.1 fL (ref 80.0–100.0)
Platelets: 228 10*3/uL (ref 150–400)
RBC: 4.47 MIL/uL (ref 4.22–5.81)
RDW: 11.9 % (ref 11.5–15.5)
WBC: 6.4 10*3/uL (ref 4.0–10.5)
nRBC: 0 % (ref 0.0–0.2)

## 2020-05-26 LAB — SURGICAL PCR SCREEN
MRSA, PCR: NEGATIVE
Staphylococcus aureus: NEGATIVE

## 2020-05-26 LAB — MAGNESIUM: Magnesium: 1.7 mg/dL (ref 1.7–2.4)

## 2020-05-26 SURGERY — REPAIR, HERNIA, INGUINAL, ADULT
Anesthesia: General | Laterality: Left

## 2020-05-26 MED ORDER — ONDANSETRON HCL 4 MG/2ML IJ SOLN
INTRAMUSCULAR | Status: AC
  Filled 2020-05-26: qty 2

## 2020-05-26 MED ORDER — ACETAMINOPHEN 325 MG PO TABS: 650.0000 mg | ORAL_TABLET | Freq: Four times a day (QID) | ORAL | Status: DC | PRN

## 2020-05-26 MED ORDER — CHLORHEXIDINE GLUCONATE 0.12 % MT SOLN
OROMUCOSAL | Status: AC
  Filled 2020-05-26: qty 15

## 2020-05-26 MED ORDER — MEPERIDINE HCL 50 MG/ML IJ SOLN: 6.2500 mg | INTRAMUSCULAR | Status: DC | PRN

## 2020-05-26 MED ORDER — SUGAMMADEX SODIUM 200 MG/2ML IV SOLN
INTRAVENOUS | Status: DC | PRN
  Administered 2020-05-26: 200 mg via INTRAVENOUS

## 2020-05-26 MED ORDER — FENTANYL CITRATE (PF) 100 MCG/2ML IJ SOLN
INTRAMUSCULAR | Status: DC | PRN
Start: 2020-05-26 — End: 2020-05-26
  Administered 2020-05-26 (×2): 50 ug via INTRAVENOUS
  Administered 2020-05-26: 100 ug via INTRAVENOUS
  Administered 2020-05-26 (×2): 50 ug via INTRAVENOUS

## 2020-05-26 MED ORDER — PROPOFOL 10 MG/ML IV BOLUS
INTRAVENOUS | Status: DC | PRN
  Administered 2020-05-26: 150 mg via INTRAVENOUS
  Administered 2020-05-26: 50 mg via INTRAVENOUS

## 2020-05-26 MED ORDER — KETOROLAC TROMETHAMINE 30 MG/ML IJ SOLN
30.0000 mg | Freq: Four times a day (QID) | INTRAMUSCULAR | Status: AC
  Administered 2020-05-26: 30 mg via INTRAVENOUS
  Filled 2020-05-26: qty 1

## 2020-05-26 MED ORDER — BUPIVACAINE LIPOSOME 1.3 % IJ SUSP
INTRAMUSCULAR | Status: DC | PRN
  Administered 2020-05-26: 20 mL

## 2020-05-26 MED ORDER — ONDANSETRON HCL 4 MG/2ML IJ SOLN
INTRAMUSCULAR | Status: DC | PRN
  Administered 2020-05-26 (×2): 4 mg via INTRAVENOUS

## 2020-05-26 MED ORDER — LACTATED RINGERS IV SOLN: INTRAVENOUS | Status: DC | PRN

## 2020-05-26 MED ORDER — KETOROLAC TROMETHAMINE 30 MG/ML IJ SOLN
30.0000 mg | Freq: Four times a day (QID) | INTRAMUSCULAR | Status: DC | PRN
  Administered 2020-05-27: 30 mg via INTRAVENOUS
  Filled 2020-05-26: qty 1

## 2020-05-26 MED ORDER — MUPIROCIN 2 % EX OINT
1.0000 "application " | TOPICAL_OINTMENT | Freq: Two times a day (BID) | CUTANEOUS | Status: DC
  Administered 2020-05-26 (×2): 1 via NASAL
  Filled 2020-05-26: qty 22

## 2020-05-26 MED ORDER — SIMETHICONE 80 MG PO CHEW: 40.0000 mg | CHEWABLE_TABLET | Freq: Four times a day (QID) | ORAL | Status: DC | PRN

## 2020-05-26 MED ORDER — FENTANYL CITRATE (PF) 250 MCG/5ML IJ SOLN
INTRAMUSCULAR | Status: AC
  Filled 2020-05-26: qty 5

## 2020-05-26 MED ORDER — PHENYLEPHRINE HCL (PRESSORS) 10 MG/ML IV SOLN
INTRAVENOUS | Status: DC | PRN
  Administered 2020-05-26: 120 ug via INTRAVENOUS
  Administered 2020-05-26: 80 ug via INTRAVENOUS

## 2020-05-26 MED ORDER — PROPOFOL 10 MG/ML IV BOLUS
INTRAVENOUS | Status: AC
  Filled 2020-05-26: qty 40

## 2020-05-26 MED ORDER — MIDAZOLAM HCL 2 MG/2ML IJ SOLN
INTRAMUSCULAR | Status: AC
  Filled 2020-05-26: qty 2

## 2020-05-26 MED ORDER — CHLORHEXIDINE GLUCONATE 0.12 % MT SOLN
15.0000 mL | Freq: Once | OROMUCOSAL | Status: AC
  Administered 2020-05-26: 15 mL via OROMUCOSAL

## 2020-05-26 MED ORDER — PROMETHAZINE HCL 25 MG/ML IJ SOLN: 6.2500 mg | INTRAMUSCULAR | Status: DC | PRN

## 2020-05-26 MED ORDER — SUCCINYLCHOLINE CHLORIDE 200 MG/10ML IV SOSY
PREFILLED_SYRINGE | INTRAVENOUS | Status: AC
  Filled 2020-05-26: qty 10

## 2020-05-26 MED ORDER — FENTANYL CITRATE (PF) 100 MCG/2ML IJ SOLN
25.0000 ug | INTRAMUSCULAR | Status: DC | PRN
  Administered 2020-05-26 – 2020-05-27 (×2): 25 ug via INTRAVENOUS
  Filled 2020-05-26 (×2): qty 2

## 2020-05-26 MED ORDER — ONDANSETRON HCL 4 MG/2ML IJ SOLN: 4.0000 mg | Freq: Four times a day (QID) | INTRAMUSCULAR | Status: DC | PRN

## 2020-05-26 MED ORDER — ROCURONIUM BROMIDE 10 MG/ML (PF) SYRINGE
PREFILLED_SYRINGE | INTRAVENOUS | Status: AC
  Filled 2020-05-26: qty 10

## 2020-05-26 MED ORDER — SODIUM CHLORIDE 0.9 % IR SOLN
Status: DC | PRN
  Administered 2020-05-26: 1000 mL

## 2020-05-26 MED ORDER — MIDAZOLAM HCL 2 MG/2ML IJ SOLN
INTRAMUSCULAR | Status: DC | PRN
  Administered 2020-05-26 (×2): 1 mg via INTRAVENOUS

## 2020-05-26 MED ORDER — TRAMADOL HCL 50 MG PO TABS
50.0000 mg | ORAL_TABLET | Freq: Four times a day (QID) | ORAL | Status: DC | PRN
  Administered 2020-05-26 – 2020-05-27 (×2): 50 mg via ORAL
  Filled 2020-05-26 (×2): qty 1

## 2020-05-26 MED ORDER — GLYCOPYRROLATE 0.2 MG/ML IJ SOLN
INTRAMUSCULAR | Status: DC | PRN
  Administered 2020-05-26: .2 mg via INTRAVENOUS

## 2020-05-26 MED ORDER — INSULIN ASPART 100 UNIT/ML ~~LOC~~ SOLN: 0.0000 [IU] | Freq: Three times a day (TID) | SUBCUTANEOUS | Status: DC

## 2020-05-26 MED ORDER — ROCURONIUM BROMIDE 100 MG/10ML IV SOLN
INTRAVENOUS | Status: DC | PRN
  Administered 2020-05-26: 50 mg via INTRAVENOUS
  Administered 2020-05-26: 10 mg via INTRAVENOUS

## 2020-05-26 MED ORDER — LIDOCAINE HCL (CARDIAC) PF 100 MG/5ML IV SOSY
PREFILLED_SYRINGE | INTRAVENOUS | Status: DC | PRN
  Administered 2020-05-26: 100 mg via INTRAVENOUS

## 2020-05-26 MED ORDER — ONDANSETRON 4 MG PO TBDP: 4.0000 mg | ORAL_TABLET | Freq: Four times a day (QID) | ORAL | Status: DC | PRN

## 2020-05-26 MED ORDER — ESMOLOL HCL 100 MG/10ML IV SOLN
INTRAVENOUS | Status: DC | PRN
  Administered 2020-05-26: 30 mg via INTRAVENOUS

## 2020-05-26 MED ORDER — SODIUM CHLORIDE 0.9 % IV SOLN: INTRAVENOUS | Status: DC

## 2020-05-26 MED ORDER — ACETAMINOPHEN 650 MG RE SUPP: 650.0000 mg | Freq: Four times a day (QID) | RECTAL | Status: DC | PRN

## 2020-05-26 MED ORDER — LACTATED RINGERS IV SOLN: Freq: Once | INTRAVENOUS | Status: AC

## 2020-05-26 MED ORDER — HYDROMORPHONE HCL 1 MG/ML IJ SOLN
0.2500 mg | INTRAMUSCULAR | Status: DC | PRN
  Administered 2020-05-26: 0.5 mg via INTRAVENOUS
  Filled 2020-05-26: qty 0.5

## 2020-05-26 MED ORDER — ATORVASTATIN CALCIUM 20 MG PO TABS
20.0000 mg | ORAL_TABLET | Freq: Every day | ORAL | Status: DC
  Administered 2020-05-26: 20 mg via ORAL
  Filled 2020-05-26: qty 1

## 2020-05-26 MED ORDER — ORAL CARE MOUTH RINSE: 15.0000 mL | Freq: Once | OROMUCOSAL | Status: AC

## 2020-05-26 MED ORDER — BUPIVACAINE LIPOSOME 1.3 % IJ SUSP
INTRAMUSCULAR | Status: AC
  Filled 2020-05-26: qty 10

## 2020-05-26 MED ORDER — LACTATED RINGERS IV BOLUS
1000.0000 mL | Freq: Once | INTRAVENOUS | Status: AC
  Administered 2020-05-26: 1000 mL via INTRAVENOUS

## 2020-05-26 MED ORDER — LIDOCAINE 2% (20 MG/ML) 5 ML SYRINGE
INTRAMUSCULAR | Status: AC
  Filled 2020-05-26: qty 15

## 2020-05-26 MED ORDER — ROCURONIUM BROMIDE 10 MG/ML (PF) SYRINGE
PREFILLED_SYRINGE | INTRAVENOUS | Status: AC
  Filled 2020-05-26: qty 20

## 2020-05-26 MED ORDER — FENTANYL CITRATE (PF) 100 MCG/2ML IJ SOLN
INTRAMUSCULAR | Status: AC
  Filled 2020-05-26: qty 2

## 2020-05-26 MED ORDER — SUCCINYLCHOLINE CHLORIDE 200 MG/10ML IV SOSY
PREFILLED_SYRINGE | INTRAVENOUS | Status: DC | PRN
  Administered 2020-05-26: 140 mg via INTRAVENOUS

## 2020-05-26 SURGICAL SUPPLY — 49 items
ADH SKN CLS APL DERMABOND .7 (GAUZE/BANDAGES/DRESSINGS) ×2
BLADE SURG SZ11 CARB STEEL (BLADE) ×1 IMPLANT
CLOTH BEACON ORANGE TIMEOUT ST (SAFETY) ×3 IMPLANT
COVER LIGHT HANDLE STERIS (MISCELLANEOUS) ×6 IMPLANT
COVER WAND RF STERILE (DRAPES) ×3 IMPLANT
DERMABOND ADVANCED (GAUZE/BANDAGES/DRESSINGS) ×1
DERMABOND ADVANCED .7 DNX12 (GAUZE/BANDAGES/DRESSINGS) ×2 IMPLANT
DRAIN PENROSE 0.5X18 (DRAIN) ×3 IMPLANT
ELECT REM PT RETURN 9FT ADLT (ELECTROSURGICAL) ×3
ELECTRODE REM PT RTRN 9FT ADLT (ELECTROSURGICAL) ×2 IMPLANT
GAUZE SPONGE 4X4 12PLY STRL (GAUZE/BANDAGES/DRESSINGS) ×3 IMPLANT
GLOVE BIO SURGEON STRL SZ 6.5 (GLOVE) ×1 IMPLANT
GLOVE BIOGEL PI IND STRL 6.5 (GLOVE) IMPLANT
GLOVE BIOGEL PI IND STRL 7.0 (GLOVE) ×6 IMPLANT
GLOVE BIOGEL PI INDICATOR 6.5 (GLOVE) ×2
GLOVE BIOGEL PI INDICATOR 7.0 (GLOVE) ×3
GLOVE ECLIPSE 6.5 STRL STRAW (GLOVE) ×1 IMPLANT
GLOVE SURG SS PI 6.5 STRL IVOR (GLOVE) ×1 IMPLANT
GLOVE SURG SS PI 7.5 STRL IVOR (GLOVE) ×3 IMPLANT
GOWN STRL REUS W/ TWL LRG LVL3 (GOWN DISPOSABLE) IMPLANT
GOWN STRL REUS W/TWL LRG LVL3 (GOWN DISPOSABLE) ×12 IMPLANT
INST SET MINOR GENERAL (KITS) ×3 IMPLANT
KIT TURNOVER KIT A (KITS) ×3 IMPLANT
LIGASURE IMPACT 36 18CM CVD LR (INSTRUMENTS) ×1 IMPLANT
MANIFOLD NEPTUNE II (INSTRUMENTS) ×3 IMPLANT
MESH MARLEX PLUG MEDIUM (Mesh General) ×1 IMPLANT
MESH VENTRALEX ST 1-7/10 CRC S (Mesh General) ×1 IMPLANT
MESH VENTRALEX ST 2.5 CRC MED (Mesh General) ×1 IMPLANT
NDL HYPO 18GX1.5 BLUNT FILL (NEEDLE) ×2 IMPLANT
NEEDLE HYPO 18GX1.5 BLUNT FILL (NEEDLE) ×3 IMPLANT
NEEDLE HYPO 22GX1.5 SAFETY (NEEDLE) ×3 IMPLANT
NS IRRIG 1000ML POUR BTL (IV SOLUTION) ×3 IMPLANT
PACK MINOR (CUSTOM PROCEDURE TRAY) ×3 IMPLANT
PAD ARMBOARD 7.5X6 YLW CONV (MISCELLANEOUS) ×3 IMPLANT
PENCIL SMOKE EVACUATOR (MISCELLANEOUS) ×3 IMPLANT
SET BASIN LINEN APH (SET/KITS/TRAYS/PACK) ×3 IMPLANT
SOL PREP PROV IODINE SCRUB 4OZ (MISCELLANEOUS) ×3 IMPLANT
SUT ETHIBOND NAB MO 7 #0 18IN (SUTURE) ×1 IMPLANT
SUT MNCRL AB 4-0 PS2 18 (SUTURE) ×4 IMPLANT
SUT NOVA NAB GS-22 2 2-0 T-19 (SUTURE) ×8 IMPLANT
SUT SILK 3 0 (SUTURE)
SUT SILK 3-0 18XBRD TIE 12 (SUTURE) IMPLANT
SUT VIC AB 2-0 CT1 27 (SUTURE) ×3
SUT VIC AB 2-0 CT1 TAPERPNT 27 (SUTURE) ×2 IMPLANT
SUT VIC AB 2-0 CT2 27 (SUTURE) ×1 IMPLANT
SUT VIC AB 3-0 SH 27 (SUTURE) ×6
SUT VIC AB 3-0 SH 27X BRD (SUTURE) ×2 IMPLANT
SUT VICRYL AB 3 0 TIES (SUTURE) ×1 IMPLANT
SYR 20ML LL LF (SYRINGE) ×6 IMPLANT

## 2020-05-26 NOTE — Transfer of Care (Signed)
Immediate Anesthesia Transfer of Care Note  Patient: Gregory Andrews  Procedure(s) Performed: LEFT INGUINAL HERNIORRHAPY WITH MESH (Left ) UMBILICAL HERNIORRHAPY WITH MESH  Patient Location: PACU  Anesthesia Type:General  Level of Consciousness: awake, alert , oriented and sedated  Airway & Oxygen Therapy: Patient Spontanous Breathing and Patient connected to nasal cannula oxygen  Post-op Assessment: Report given to RN and Post -op Vital signs reviewed and stable  Post vital signs: Reviewed and stable  Last Vitals:  Vitals Value Taken Time  BP 150/94 05/26/20 1812  Temp 97.8   Pulse 80 05/26/20 1816  Resp 9 05/26/20 1816  SpO2 95 % 05/26/20 1816  Vitals shown include unvalidated device data.  Last Pain:  Vitals:   05/26/20 1400  TempSrc:   PainSc: 4          Complications: No complications documented.

## 2020-05-26 NOTE — Anesthesia Procedure Notes (Signed)
Procedure Name: Intubation Date/Time: 05/26/2020 4:38 PM Performed by: Georgeanne Nim, CRNA Pre-anesthesia Checklist: Patient identified, Emergency Drugs available, Suction available, Patient being monitored and Timeout performed Patient Re-evaluated:Patient Re-evaluated prior to induction Oxygen Delivery Method: Circle system utilized Preoxygenation: Pre-oxygenation with 100% oxygen Induction Type: IV induction and Rapid sequence Laryngoscope Size: Mac and 4 Grade View: Grade I Tube type: Oral Tube size: 7.5 mm Number of attempts: 1 Airway Equipment and Method: Stylet Placement Confirmation: ETT inserted through vocal cords under direct vision,  positive ETCO2,  CO2 detector and breath sounds checked- equal and bilateral Secured at: 23 cm Tube secured with: Tape Dental Injury: Teeth and Oropharynx as per pre-operative assessment

## 2020-05-26 NOTE — Op Note (Signed)
Patient:  Gregory Andrews  DOB:  [birth year]  MRN:  [id number]   Preop Diagnosis: Incarcerated left inguinal hernia, incarcerated umbilical hernia  Postop Diagnosis: Same  Procedure: Incarcerated left inguinal herniorrhaphy with mesh, incarcerated umbilical herniorrhaphy with mesh  Surgeon: Franky Macho, MD  Anes: General endotracheal  Indications: Patient is a 59 year old black male with a history of pulmonary emboli on chronic Eliquis anticoagulation who presented to the emergency room twice within a 24-hour period with an incarcerated left inguinal hernia and causing a partial bowel obstruction.  Both times it was reduced.  Due to the recurrent nature, the patient was brought into the hospital.  His Eliquis was stopped.  In addition, he has an incarcerated umbilical hernia which has been asymptomatic, and only this filled with adipose tissue.  As he is stopping his Eliquis, is elected to proceed with repair of both hernias.  The risks and benefits of the procedures including bleeding, infection, mesh use, and the possibility of recurrence of the hernia were fully explained to the patient, who gave informed consent.  Procedure note: The patient was placed in supine position.  After induction of general endotracheal anesthesia, the abdomen was prepped and draped using usual sterile technique with Betadine.  Surgical site confirmation was performed.  An incision was made in the left groin region down to the external oblique aponeurosis.  The aponeurosis was incised to the external ring.  A Penrose drain was placed around the spermatic cord.  The vas deferens was noted within the spermatic cord.  The ilioinguinal nerve was identified and retracted superiorly from the operative field.  No indirect hernia was seen.  The patient had a narrow necked direct hernia with omentum and properitoneal fat present within it.  The hernia sac was excised and the omental contents reduced.  A Bard Ventralax ST  patch, 4.3 cm, was then placed into this region and secured to the fascia using 0 Ethibond interrupted sutures.  An onlay patch was then placed along the floor of the inguinal canal and secured superiorly to the conjoined tendon and inferiorly to the shelving edge of Poupart's ligament using 2-0 Novafil interrupted sutures.  The internal ring was recreated using a 2-0 Novafil interrupted suture.  The external Bleich aponeurosis was reapproximated using a 2-0 Vicryl running suture.  Subcutaneous layer was reapproximated using a 3-0 Vicryl interrupted suture.  The skin was closed using a 4-0 Monocryl subcuticular suture.  Exparel was instilled into the surrounding wound.  Next, the umbilical hernia was addressed.  An infraumbilical incision was made down to the fascia.  The umbilicus was freed away from the underlying hernia sac.  The patient was noted to have incarcerated omentum and falciform ligament within the omental sac.  This was excised using the LigaSure.  The omental contents were then reduced.  I palpated the underside of the abdominal wall and no other defects were noted.  The defect measured approximately 2 cm in its greatest diameter.  A 6.4 cm Bard  Ventralax ST patch was then inserted and secured to the fascia using 0 Novafil interrupted sutures.  The fascia overlying the mesh was then reapproximated transversely using 0 Ethibond interrupted sutures.  The umbilicus was secured back to the fascia using a 2-0 Vicryl interrupted suture.  The skin was closed using a 3-0 Vicryl subcutaneous suture.  Exparel was instilled into the surrounding wound.  The skin was closed using a 4-0 Monocryl subcuticular suture.  Dermabond was applied.  All tape and needle counts  were correct at the end of the procedures.  The patient was extubated in the operating room and transferred to PACU in stable condition.  Complications: None  EBL: Minimal  Specimen: None

## 2020-05-26 NOTE — Addendum Note (Signed)
Addendum  created 05/26/20 1829 by Molli Barrows, MD   Order list changed

## 2020-05-26 NOTE — Anesthesia Postprocedure Evaluation (Signed)
Anesthesia Post Note  Patient: Gregory Andrews  Procedure(s) Performed: LEFT INGUINAL HERNIORRHAPY WITH MESH (Left ) UMBILICAL HERNIORRHAPY WITH MESH  Patient location during evaluation: PACU Anesthesia Type: General Level of consciousness: awake and alert, oriented and sedated Pain management: pain level controlled Vital Signs Assessment: post-procedure vital signs reviewed and stable Respiratory status: spontaneous breathing, respiratory function stable and patient connected to nasal cannula oxygen Cardiovascular status: blood pressure returned to baseline Postop Assessment: no apparent nausea or vomiting Anesthetic complications: no   No complications documented.   Last Vitals:  Vitals:   05/26/20 1811 05/26/20 1812  BP: (!) 150/94   Pulse: 88   Resp: 13   Temp: 36.6 C   SpO2: (!) 88% 99%    Last Pain:  Vitals:   05/26/20 1811  TempSrc:   PainSc: Asleep                 Daysean Tinkham C Oliverio Cho

## 2020-05-26 NOTE — Interval H&P Note (Signed)
History and Physical Interval Note:  05/26/2020 1:39 PM  Gregory Andrews  has presented today for surgery, with the diagnosis of incarcerated left inguinal hernia.  The various methods of treatment have been discussed with the patient and family. After consideration of risks, benefits and other options for treatment, the patient has consented to  Procedure(s): HERNIA REPAIR INGUINAL ADULT (Left) as a surgical intervention.  The patient's history has been reviewed, patient examined, no change in status, stable for surgery.  I have reviewed the patient's chart and labs.  Questions were answered to the patient's satisfaction.     Franky Macho

## 2020-05-26 NOTE — Anesthesia Preprocedure Evaluation (Addendum)
Anesthesia Evaluation  Patient identified by MRN, date of birth, ID band Patient awake    Reviewed: Allergy & Precautions, NPO status , Patient's Chart, lab work & pertinent test results  History of Anesthesia Complications Negative for: history of anesthetic complications  Airway Mallampati: II  TM Distance: >3 FB Neck ROM: Full    Dental  (+) Missing, Dental Advisory Given   Pulmonary Current Smoker and Patient abstained from smoking., PE   Pulmonary exam normal breath sounds clear to auscultation       Cardiovascular Exercise Tolerance: Good hypertension, Pt. 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, left kidney removed )     Musculoskeletal   Abdominal   Peds  Hematology   Anesthesia Other Findings   Reproductive/Obstetrics                            Anesthesia Physical Anesthesia Plan  ASA: III  Anesthesia Plan: General   Post-op Pain Management:    Induction: Intravenous  PONV Risk Score and Plan:   Airway Management Planned: Oral ETT  Additional Equipment:   Intra-op Plan:   Post-operative Plan: Extubation in OR  Informed Consent: I have reviewed the patients History and Physical, chart, labs and discussed the procedure including the risks, benefits and alternatives for the proposed anesthesia with the patient or authorized representative who has indicated his/her understanding and acceptance.     Dental advisory given  Plan Discussed with: CRNA and Surgeon  Anesthesia Plan Comments:         Anesthesia Quick Evaluation

## 2020-05-27 ENCOUNTER — Encounter (HOSPITAL_COMMUNITY): Payer: Self-pay | Admitting: General Surgery

## 2020-05-27 LAB — GLUCOSE, CAPILLARY: Glucose-Capillary: 144 mg/dL — ABNORMAL HIGH (ref 70–99)

## 2020-05-27 NOTE — Discharge Instructions (Signed)
Open Hernia Repair, Adult, Care After This sheet gives you information about how to care for yourself after your procedure. Your health care provider may also give you more specific instructions. If you have problems or questions, contact your health care provider. What can I expect after the procedure? After the procedure, it is common to have:  Mild discomfort.  Slight bruising.  Minor swelling.  Pain in the abdomen. Follow these instructions at home: Incision care   Follow instructions from your health care provider about how to take care of your incision area. Make sure you: ? Wash your hands with soap and water before you change your bandage (dressing). If soap and water are not available, use hand sanitizer. ? Change your dressing as told by your health care provider. ? Leave stitches (sutures), skin glue, or adhesive strips in place. These skin closures may need to stay in place for 2 weeks or longer. If adhesive strip edges start to loosen and curl up, you may trim the loose edges. Do not remove adhesive strips completely unless your health care provider tells you to do that.  Check your incision area every day for signs of infection. Check for: ? More redness, swelling, or pain. ? More fluid or blood. ? Warmth. ? Pus or a bad smell. Activity  Do not drive or use heavy machinery while taking prescription pain medicine. Do not drive until your health care provider approves.  Until your health care provider approves: ? Do not lift anything that is heavier than 10 lb (4.5 kg). ? Do not play contact sports.  Return to your normal activities as told by your health care provider. Ask your health care provider what activities are safe. General instructions  To prevent or treat constipation while you are taking prescription pain medicine, your health care provider may recommend that you: ? Drink enough fluid to keep your urine clear or pale yellow. ? Take over-the-counter or  prescription medicines. ? Eat foods that are high in fiber, such as fresh fruits and vegetables, whole grains, and beans. ? Limit foods that are high in fat and processed sugars, such as fried and sweet foods.  Take over-the-counter and prescription medicines only as told by your health care provider.  Do not take tub baths or go swimming until your health care provider approves.  Keep all follow-up visits as told by your health care provider. This is important. Contact a health care provider if:  You develop a rash.  You have more redness, swelling, or pain around your incision.  You have more fluid or blood coming from your incision.  Your incision feels warm to the touch.  You have pus or a bad smell coming from your incision.  You have a fever or chills.  You have blood in your stool (feces).  You have not had a bowel movement in 2-3 days.  Your pain is not controlled with medicine. Get help right away if:  You have chest pain or shortness of breath.  You feel light-headed or feel faint.  You have severe pain.  You vomit and your pain is worse. This information is not intended to replace advice given to you by your health care provider. Make sure you discuss any questions you have with your health care provider. Document Revised: 08/12/2017 Document Reviewed: 02/11/2016 Elsevier Patient Education  2020 [employer]  

## 2020-05-27 NOTE — Discharge Summary (Signed)
Physician Discharge Summary  Patient ID: Gregory Andrews MRN: [id number] DOB/AGE: [birth year] 58 y.o.  Admit date: 05/24/2020 Discharge date: 05/27/2020  Admission Diagnoses: Incarcerated left inguinal hernia, chronic anticoagulation  Discharge Diagnoses: Same, incarcerated umbilical hernia Principal Problem:   Left inguinal hernia Active Problems:   Tobacco abuse   Small bowel obstruction (HCC)   Essential hypertension   BPH (benign prostatic hyperplasia)   GERD (gastroesophageal reflux disease)   History of pulmonary embolism   History of DVT (deep vein thrombosis)   Hyperglycemia due to diabetes mellitus ([place])   Incarcerated hernia   Incarcerated umbilical hernia   Discharged Condition: good  Hospital Course: Patient is a 60 year old black male who presented to the emergency room on 05/24/2020 with recurrence of an incarcerated left inguinal hernia causing a partial small bowel obstruction.  It was reduced in the emergency room.  In addition, he is on Eliquis for history of DVT/PE.  As this was his second visit to the emergency room within 24 hours, he was brought into the hospital to monitor the hernia.  He subsequently underwent a left inguinal herniorrhaphy with mesh for incarceration and an umbilical herniorrhaphy with mesh for incarceration on 05/26/2020.  He tolerated both procedures well.  His postoperative course has been unremarkable.  His diet was advanced without difficulty.  The patient is being discharged home on 05/27/2020 in good and improving condition.  He is to restart his Eliquis today.  Treatments: surgery: Left inguinal herniorrhaphy with mesh, umbilical herniorrhaphy with mesh on 05/26/2020  Discharge Exam: Blood pressure 124/84, pulse 67, temperature 98.2 F (36.8 C), resp. rate 15, height _0  (1.778 m), weight 86.5 kg, SpO2 97 %. General appearance: alert, cooperative and no distress Resp: clear to auscultation bilaterally Cardio: regular rate and  rhythm, S1, S2 normal, no murmur, click, rub or gallop GI: Soft, incisions healing well.  Disposition: Discharge disposition: 01-Home or Self Care       Discharge Instructions    Diet - low sodium heart healthy   Complete by: As directed    Increase activity slowly   Complete by: As directed      Allergies as of 05/27/2020      Reactions   Codeine Shortness Of Breath      Medication List    TAKE these medications   apixaban 5 MG Tabs tablet Commonly known as: Eliquis Take 1 tablet (5 mg total) by mouth 2 (two) times daily. In place of Coumadin   atorvastatin 20 MG tablet Commonly known as: LIPITOR Take 20 mg by mouth at bedtime.   blood glucose meter kit and supplies Dispense  1 Relion Prime glucometer, 1 box of test strips, lancets, alcohol wipes and cotton balls. Use up to four times daily as directed. (FOR ICD-9 250.00, 250.01).   glimepiride 2 MG tablet Commonly known as: AMARYL Take 1 tablet (2 mg total) by mouth 2 (two) times daily before a meal. Before Breakfast and Supper   glucose blood test strip Commonly known as: ReliOn Prime Test Use as instructed to test blood sugar before meals and before bedtime   insulin glargine 100 UNIT/ML injection Commonly known as: LANTUS Take 20 units every evening   lisinopril 10 MG tablet Commonly known as: ZESTRIL Take 1 tablet (10 mg total) by mouth daily. For Blood Pressure and Kidney Protection   metFORMIN 850 MG tablet Commonly known as: Glucophage Take 1 tablet (850 mg total) by mouth 2 (two) times daily with a meal. Breakfast and Supper  pantoprazole 20 MG tablet Commonly known as: PROTONIX Take 1 tablet (20 mg total) by mouth daily.   ReliOn Prime Monitor Devi 1 Device by Does not apply route as directed. Glucometer       Follow-up Information    Aviva Signs, MD Follow up.   Specialty: General Surgery Why: Will call you next week for follow up Contact information: [id number] [address] [phone]               Signed: Aviva Signs 05/27/2020, 8:50 AM

## 2020-05-27 NOTE — Progress Notes (Signed)
Discharge instructions reviewed with patient. Given AVS. Office to call and schedule his follow-up per Dr. Lovell Sheehan instructions. Instructed him to call office if he does not hear from anyone in the next day or two. Verbalized understanding of instructions, post-op care and when to seek medical attention. IV site removed, site within normal limits. Patient left floor in stable condition via w/c accompanied by nursing staff.

## 2020-05-30 ENCOUNTER — Telehealth (INDEPENDENT_AMBULATORY_CARE_PROVIDER_SITE_OTHER): Payer: 59 | Admitting: General Surgery

## 2020-05-30 ENCOUNTER — Other Ambulatory Visit: Payer: Self-pay | Admitting: Family Medicine

## 2020-05-30 DIAGNOSIS — K460 Unspecified abdominal hernia with obstruction, without gangrene: Secondary | ICD-10-CM

## 2020-05-30 DIAGNOSIS — K420 Umbilical hernia with obstruction, without gangrene: Secondary | ICD-10-CM

## 2020-05-30 MED ORDER — HYDROCODONE-ACETAMINOPHEN 5-325 MG PO TABS
1.0000 | ORAL_TABLET | Freq: Four times a day (QID) | ORAL | 0 refills | Status: DC | PRN
Start: 2020-05-30 — End: 2020-12-02

## 2020-05-30 NOTE — Telephone Encounter (Signed)
[facility]  Patient called office and requested more pain medication. Had norco rx from ED 9/11, needing more after discharge on 9/13. Had umbilical and inguinal hernia repair.  Norco 5/325mg  q6 PRN (#15) sent in to [facility].  Office spoke with patient and he had been tolerating the Norco.  Algis Greenhouse, MD [facility] [address] [place], [place] [address] [phone] (office)

## 2020-06-06 ENCOUNTER — Ambulatory Visit: Payer: Self-pay | Admitting: General Surgery

## 2020-12-02 ENCOUNTER — Other Ambulatory Visit: Payer: Self-pay

## 2020-12-02 ENCOUNTER — Emergency Department (HOSPITAL_COMMUNITY)
Admission: EM | Admit: 2020-12-02 | Discharge: 2020-12-02 | Disposition: A | Discharge status: Home / Self Care | Payer: 59 | Attending: Emergency Medicine | Admitting: Emergency Medicine

## 2020-12-02 ENCOUNTER — Emergency Department (HOSPITAL_COMMUNITY): Payer: 59

## 2020-12-02 ENCOUNTER — Encounter (HOSPITAL_COMMUNITY): Payer: Self-pay | Admitting: Emergency Medicine

## 2020-12-02 DIAGNOSIS — Z87891 Personal history of nicotine dependence: Secondary | ICD-10-CM | POA: Diagnosis not present

## 2020-12-02 DIAGNOSIS — Z79899 Other long term (current) drug therapy: Secondary | ICD-10-CM | POA: Diagnosis not present

## 2020-12-02 DIAGNOSIS — S129XXA Fracture of neck, unspecified, initial encounter: Secondary | ICD-10-CM | POA: Diagnosis not present

## 2020-12-02 DIAGNOSIS — Z794 Long term (current) use of insulin: Secondary | ICD-10-CM | POA: Diagnosis not present

## 2020-12-02 DIAGNOSIS — Z7984 Long term (current) use of oral hypoglycemic drugs: Secondary | ICD-10-CM | POA: Diagnosis not present

## 2020-12-02 DIAGNOSIS — R1012 Left upper quadrant pain: Secondary | ICD-10-CM | POA: Diagnosis not present

## 2020-12-02 DIAGNOSIS — I10 Essential (primary) hypertension: Secondary | ICD-10-CM | POA: Insufficient documentation

## 2020-12-02 DIAGNOSIS — W14XXXA Fall from tree, initial encounter: Secondary | ICD-10-CM | POA: Insufficient documentation

## 2020-12-02 DIAGNOSIS — R52 Pain, unspecified: Secondary | ICD-10-CM

## 2020-12-02 DIAGNOSIS — S299XXA Unspecified injury of thorax, initial encounter: Secondary | ICD-10-CM | POA: Diagnosis present

## 2020-12-02 DIAGNOSIS — E1110 Type 2 diabetes mellitus with ketoacidosis without coma: Secondary | ICD-10-CM | POA: Diagnosis not present

## 2020-12-02 DIAGNOSIS — M545 Low back pain, unspecified: Secondary | ICD-10-CM | POA: Insufficient documentation

## 2020-12-02 DIAGNOSIS — S2242XA Multiple fractures of ribs, left side, initial encounter for closed fracture: Secondary | ICD-10-CM | POA: Diagnosis not present

## 2020-12-02 DIAGNOSIS — Z7901 Long term (current) use of anticoagulants: Secondary | ICD-10-CM | POA: Insufficient documentation

## 2020-12-02 LAB — HEPATIC FUNCTION PANEL
ALT: 19 U/L (ref 0–44)
AST: 21 U/L (ref 15–41)
Albumin: 3.8 g/dL (ref 3.5–5.0)
Alkaline Phosphatase: 70 U/L (ref 38–126)
Bilirubin, Direct: 0.1 mg/dL (ref 0.0–0.2)
Indirect Bilirubin: 0.5 mg/dL (ref 0.3–0.9)
Total Bilirubin: 0.6 mg/dL (ref 0.3–1.2)
Total Protein: 7.5 g/dL (ref 6.5–8.1)

## 2020-12-02 LAB — URINALYSIS, ROUTINE W REFLEX MICROSCOPIC
Bacteria, UA: NONE SEEN
Bilirubin Urine: NEGATIVE
Glucose, UA: NEGATIVE mg/dL
Hgb urine dipstick: NEGATIVE
Ketones, ur: NEGATIVE mg/dL
Leukocytes,Ua: NEGATIVE
Nitrite: NEGATIVE
Protein, ur: 30 mg/dL — AB
Specific Gravity, Urine: 1.016 (ref 1.005–1.030)
pH: 5 (ref 5.0–8.0)

## 2020-12-02 LAB — CBC WITH DIFFERENTIAL/PLATELET
Abs Immature Granulocytes: 0.01 10*3/uL (ref 0.00–0.07)
Basophils Absolute: 0 10*3/uL (ref 0.0–0.1)
Basophils Relative: 1 %
Eosinophils Absolute: 0.1 10*3/uL (ref 0.0–0.5)
Eosinophils Relative: 2 %
HCT: 44.2 % (ref 39.0–52.0)
Hemoglobin: 14.5 g/dL (ref 13.0–17.0)
Immature Granulocytes: 0 %
Lymphocytes Relative: 41 %
Lymphs Abs: 2.4 10*3/uL (ref 0.7–4.0)
MCH: 31.3 pg (ref 26.0–34.0)
MCHC: 32.8 g/dL (ref 30.0–36.0)
MCV: 95.5 fL (ref 80.0–100.0)
Monocytes Absolute: 0.7 10*3/uL (ref 0.1–1.0)
Monocytes Relative: 12 %
Neutro Abs: 2.6 10*3/uL (ref 1.7–7.7)
Neutrophils Relative %: 44 %
Platelets: 231 10*3/uL (ref 150–400)
RBC: 4.63 MIL/uL (ref 4.22–5.81)
RDW: 12.7 % (ref 11.5–15.5)
WBC: 5.9 10*3/uL (ref 4.0–10.5)
nRBC: 0 % (ref 0.0–0.2)

## 2020-12-02 LAB — I-STAT CHEM 8, ED
BUN: 15 mg/dL (ref 6–20)
Calcium, Ion: 1.23 mmol/L (ref 1.15–1.40)
Chloride: 103 mmol/L (ref 98–111)
Creatinine, Ser: 1.3 mg/dL — ABNORMAL HIGH (ref 0.61–1.24)
Glucose, Bld: 132 mg/dL — ABNORMAL HIGH (ref 70–99)
HCT: 48 % (ref 39.0–52.0)
Hemoglobin: 16.3 g/dL (ref 13.0–17.0)
Potassium: 4 mmol/L (ref 3.5–5.1)
Sodium: 140 mmol/L (ref 135–145)
TCO2: 25 mmol/L (ref 22–32)

## 2020-12-02 MED ORDER — SODIUM CHLORIDE 0.9 % IV SOLN: INTRAVENOUS | Status: DC

## 2020-12-02 MED ORDER — IOHEXOL 300 MG/ML  SOLN
100.0000 mL | Freq: Once | INTRAMUSCULAR | Status: AC | PRN
  Administered 2020-12-02: 100 mL via INTRAVENOUS

## 2020-12-02 MED ORDER — OXYCODONE-ACETAMINOPHEN 5-325 MG PO TABS: 1.0000 | ORAL_TABLET | ORAL | 0 refills | Status: DC | PRN

## 2020-12-02 MED ORDER — KETOROLAC TROMETHAMINE 30 MG/ML IJ SOLN
15.0000 mg | Freq: Once | INTRAMUSCULAR | Status: AC
  Administered 2020-12-02: 15 mg via INTRAVENOUS
  Filled 2020-12-02: qty 1

## 2020-12-02 MED ORDER — HYDROCODONE-ACETAMINOPHEN 5-325 MG PO TABS: 2.0000 | ORAL_TABLET | ORAL | 0 refills | Status: DC | PRN

## 2020-12-02 NOTE — Discharge Instructions (Addendum)
You have 2 broken ribs on the left, #10 and 11.  There are also 2 broken transverse process fractures on L1 and L2 vertebrae's.  These are both painful conditions that will improve after a month or so.  Avoid bending stretching and lifting is much as possible.  We are giving you a work note to stay out till next Monday then light duty following that for 2 weeks.

## 2020-12-02 NOTE — ED Notes (Signed)
Entered room and introduced self to patient. Pt appears to be resting in bed, respirations are even and unlabored with equal chest rise and fall. Bed is locked in the lowest position, side rails x2, call bell within reach. Pt educated on call light use and hourly rounding, verbalized understanding and in agreement at this time. All questions and concerns voiced addressed. Refreshments offered and provided per patient request. Cardiac monitor in place with vital signs cycling q30 minutes. Will continue to monitor.    Pt transported to CT by CT staff at this time.

## 2020-12-02 NOTE — ED Triage Notes (Addendum)
Pt to the ED for Back pain after falling out of a tree Saturday. Pt takes eliquis due to recurrent PE.  Pt states he was approximately 20 ft off the ground when he fell. Pain is on the left side of his back.

## 2020-12-02 NOTE — ED Provider Notes (Signed)
[facility] EMERGENCY DEPARTMENT Provider Note   CSN: [id number] Arrival date & time: 12/02/20  1509     History Chief Complaint  Patient presents with  . Back Pain    Gregory Andrews is a 60 y.o. male.  HPI He presents for evaluation of injury after falling out from a tree 4 days ago.  He states he was 20 feet off the ground when he fell.  He takes Eliquis because of prior PE.  He did not have pain initially and did not lose consciousness.  He noticed pain the next day, in his left flank region, this pain is worse with coughing, movement or lying down.  He denies shortness of breath.  He has not had any hemoptysis.  He has not had any change in bowel or urinary ability/habits.  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  There are no other known modifying factors.    Past Medical History:  Diagnosis Date  . BPH (benign prostatic hyperplasia)   . Inguinal hernia   . Noncompliance 01/21/2012   Missed appointments and lab appointments  . Pulmonary embolism ([place])   . 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 Recurrent pulmonary embolism 01/21/2012   On life-long Coumadin  . Shoulder separation    left    Patient Active Problem List   Diagnosis Date Noted  . Incarcerated hernia   . Incarcerated umbilical hernia   . Small bowel obstruction ([place]) 05/25/2020  . Left inguinal hernia 05/25/2020  . Essential hypertension 05/25/2020  . BPH (benign prostatic hyperplasia) 05/25/2020  . GERD (gastroesophageal reflux disease) 05/25/2020  . History of pulmonary embolism 05/25/2020  . History of DVT (deep vein thrombosis) 05/25/2020  . Hyperglycemia due to diabetes mellitus ([place]) 05/25/2020  . Incarcerated left inguinal hernia   . DKA (diabetic ketoacidosis) ([place]) 10/08/2017  . New onset type 2 diabetes mellitus ([place]) 10/08/2017  . Chronic anticoagulation for Recurrent DVT/PE 10/08/2017  . Tobacco abuse 10/08/2017  .  Recurrent pulmonary embolism 01/21/2012  . 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 Noncompliance 01/21/2012    Past Surgical History:  Procedure Laterality Date  . INGUINAL HERNIA REPAIR Left 05/26/2020   Procedure: LEFT INGUINAL HERNIORRHAPY WITH MESH;  Surgeon: Aviva Signs, MD;  Location: [facility]S;  Service: General;  Laterality: Left;  . left kidney removed    . right knee surgery    . UMBILICAL HERNIA REPAIR  05/26/2020   Procedure: UMBILICAL HERNIORRHAPY WITH MESH;  Surgeon: Aviva Signs, MD;  Location: [facility] ORS;  Service: General;;       Family History  Problem Relation Age of Onset  . Anesthesia problems Neg Hx     Social History   Tobacco Use  . Smoking status: Former Smoker    Types: Cigarettes    Quit date: 09/13/2020    Years since quitting: 0.2  . Smokeless tobacco: Former Systems developer    Quit date: 01/12/2012  Vaping Use  . Vaping Use: Former  Substance Use Topics  . Alcohol use: Not Currently    Comment: beer  1 - 2 week  . Drug use: No    Home Medications Prior to Admission medications   Medication Sig Start Date End Date Taking? Authorizing Provider  HYDROcodone-acetaminophen (NORCO/VICODIN) 5-325 MG tablet Take 2 tablets by mouth every 4 (four) hours as needed. 12/02/20  Yes Daleen Bo, MD  oxyCODONE-acetaminophen (PERCOCET) 5-325 MG tablet Take 1 tablet by mouth every  4 (four) hours as needed for severe pain. 12/02/20  Yes Daleen Bo, MD  apixaban (ELIQUIS) 5 MG TABS tablet Take 1 tablet (5 mg total) by mouth 2 (two) times daily. In place of Coumadin 10/10/17   Roxan Hockey, MD  atorvastatin (LIPITOR) 20 MG tablet Take 20 mg by mouth at bedtime. 04/25/20   [provider]  blood glucose meter kit and supplies Dispense  1 Relion Prime glucometer, 1 box of test strips, lancets, alcohol wipes and cotton balls. Use up to four times daily as directed. (FOR ICD-9 250.00, 250.01). 10/09/17   Roxan Hockey, MD  Blood Glucose Monitoring Suppl  (RELION PRIME MONITOR) DEVI 1 Device by Does not apply route as directed. Glucometer 10/09/17   Roxan Hockey, MD  glimepiride (AMARYL) 2 MG tablet Take 1 tablet (2 mg total) by mouth 2 (two) times daily before a meal. Before Breakfast and Supper Patient not taking: Reported on 05/26/2020 10/09/17   Roxan Hockey, MD  glucose blood (RELION PRIME TEST) test strip Use as instructed to test blood sugar before meals and before bedtime 10/09/17   Roxan Hockey, MD  insulin glargine (LANTUS) 100 UNIT/ML injection Take 20 units every evening Patient not taking: Reported on 05/26/2020 10/09/17   Roxan Hockey, MD  lisinopril (PRINIVIL,ZESTRIL) 10 MG tablet Take 1 tablet (10 mg total) by mouth daily. For Blood Pressure and Kidney Protection 10/09/17 05/26/20  Roxan Hockey, MD  metFORMIN (GLUCOPHAGE) 850 MG tablet Take 1 tablet (850 mg total) by mouth 2 (two) times daily with a meal. Breakfast and Supper Patient not taking: Reported on 05/26/2020 10/09/17 10/09/18  Roxan Hockey, MD  pantoprazole (PROTONIX) 20 MG tablet Take 1 tablet (20 mg total) by mouth daily. Patient not taking: Reported on 05/26/2020 04/29/20   Evalee Jefferson, PA-C    Allergies    Codeine  Review of Systems   Review of Systems  All other systems reviewed and are negative.   Physical Exam Updated Vital Signs BP (!) 137/91 (BP Location: Right Arm)   Pulse 95   Temp 98.5 F (36.9 C) (Oral)   Resp 16   Ht $R'5\' 10"'ca$  (1.778 m)   Wt 96.2 kg   SpO2 93%   BMI 30.42 kg/m   Physical Exam Vitals and nursing note reviewed.  Constitutional:      General: He is not in acute distress.    Appearance: He is well-developed. He is not ill-appearing, toxic-appearing or diaphoretic.  HENT:     Head: Normocephalic and atraumatic.     Right Ear: External ear normal.     Left Ear: External ear normal.  Eyes:     Conjunctiva/sclera: Conjunctivae normal.     Pupils: Pupils are equal, round, and reactive to light.  Neck:     Trachea:  Phonation normal.  Cardiovascular:     Rate and Rhythm: Normal rate and regular rhythm.     Heart sounds: Normal heart sounds.  Pulmonary:     Effort: Pulmonary effort is normal. No respiratory distress.     Breath sounds: Normal breath sounds. No stridor.  Chest:     Chest wall: Tenderness (Left posterior lower lateral; without crepitation) present.  Abdominal:     General: There is no distension.     Palpations: Abdomen is soft. There is no mass.     Tenderness: There is abdominal tenderness (Left upper quadrant, moderate).     Hernia: No hernia is present.  Musculoskeletal:        General: Normal  range of motion.     Cervical back: Normal range of motion and neck supple.     Comments: Guards against movement of the back secondary to left lower back pain.  No palpable deformity of the thoracic or lumbar spines.  Normal range of motion arms and legs bilaterally.  Skin:    General: Skin is warm and dry.  Neurological:     Mental Status: He is alert and oriented to person, place, and time.     Cranial Nerves: No cranial nerve deficit.     Sensory: No sensory deficit.     Motor: No abnormal muscle tone.     Coordination: Coordination normal.  Psychiatric:        Mood and Affect: Mood normal.        Behavior: Behavior normal.        Thought Content: Thought content normal.        Judgment: Judgment normal.     ED Results / Procedures / Treatments   Labs (all labs ordered are listed, but only abnormal results are displayed) Labs Reviewed  URINALYSIS, ROUTINE W REFLEX MICROSCOPIC - Abnormal; Notable for the following components:      Result Value   Protein, ur 30 (*)    All other components within normal limits  I-STAT CHEM 8, ED - Abnormal; Notable for the following components:   Creatinine, Ser 1.30 (*)    Glucose, Bld 132 (*)    All other components within normal limits  CBC WITH DIFFERENTIAL/PLATELET  HEPATIC FUNCTION PANEL    EKG None  Radiology CT Chest W  Contrast  Result Date: 12/02/2020 CLINICAL DATA:  Low back pain after falling Saturday. Abdominal trauma. EXAM: CT CHEST, ABDOMEN, AND PELVIS WITH CONTRAST TECHNIQUE: Multidetector CT imaging of the chest, abdomen and pelvis was performed following the standard protocol during bolus administration of intravenous contrast. CONTRAST:  163mL OMNIPAQUE IOHEXOL 300 MG/ML  SOLN COMPARISON:  Lumbar spine CT is dictated separately. Abdominopelvic CT of 05/24/2020. CT a chest 11/01/2012 FINDINGS: CT CHEST FINDINGS Cardiovascular: Aortic atherosclerosis. Tortuous thoracic aorta. Normal heart size, without pericardial effusion. No mediastinal hematoma. Mediastinum/Nodes: No mediastinal or hilar adenopathy. Lungs/Pleura: No pleural fluid. Marked left hemidiaphragm elevation, increased. No pneumothorax. Volume loss at the left lung base. No pulmonary contusion.  Mild centrilobular emphysema. Musculoskeletal: Posterolateral left tenth and eleventh rib fractures are minimally displaced including on 54 and 60 of series 2. CT ABDOMEN PELVIS FINDINGS Hepatobiliary: Segment 3 subcentimeter hepatic cyst. Normal gallbladder, without biliary ductal dilatation. Pancreas: Normal, without mass or ductal dilatation. Spleen: Normal in size, without focal abnormality. Adrenals/Urinary Tract: Normal adrenal glands. Left nephrectomy. Normal kidneys, without hydronephrosis. Normal urinary bladder. Stomach/Bowel: Normal stomach, without wall thickening. Colonic stool burden suggests constipation. Normal terminal ileum and appendix. Normal small bowel caliber. Vascular/Lymphatic: Aortic atherosclerosis. No abdominopelvic adenopathy. Reproductive: Mild prostatomegaly. Other: No significant free fluid. Interval left inguinal hernia repair. No free intraperitoneal air. Musculoskeletal: Bilateral femoral head avascular necrosis. Left L1 and L2 transverse process fractures as on dedicated lumbar spine images. IMPRESSION: 1. Displaced fractures of the  left transverse process at L1-2 as well as the left tenth and eleventh ribs. 2. No pneumothorax or other acute finding. 3. Marked left hemidiaphragm elevation. 4. Interval left inguinal hernia repair. 5.  Aortic Atherosclerosis (ICD10-I70.0).  Emphysema (ICD10-J43.9). 6. Bilateral femoral head avascular necrosis 7. Prostatomegaly. Electronically Signed   By: Abigail Miyamoto M.D.   On: 12/02/2020 19:58   CT Abdomen Pelvis W Contrast  Result Date:  12/02/2020 CLINICAL DATA:  Low back pain after falling Saturday. Abdominal trauma. EXAM: CT CHEST, ABDOMEN, AND PELVIS WITH CONTRAST TECHNIQUE: Multidetector CT imaging of the chest, abdomen and pelvis was performed following the standard protocol during bolus administration of intravenous contrast. CONTRAST:  125mL OMNIPAQUE IOHEXOL 300 MG/ML  SOLN COMPARISON:  Lumbar spine CT is dictated separately. Abdominopelvic CT of 05/24/2020. CT a chest 11/01/2012 FINDINGS: CT CHEST FINDINGS Cardiovascular: Aortic atherosclerosis. Tortuous thoracic aorta. Normal heart size, without pericardial effusion. No mediastinal hematoma. Mediastinum/Nodes: No mediastinal or hilar adenopathy. Lungs/Pleura: No pleural fluid. Marked left hemidiaphragm elevation, increased. No pneumothorax. Volume loss at the left lung base. No pulmonary contusion.  Mild centrilobular emphysema. Musculoskeletal: Posterolateral left tenth and eleventh rib fractures are minimally displaced including on 54 and 60 of series 2. CT ABDOMEN PELVIS FINDINGS Hepatobiliary: Segment 3 subcentimeter hepatic cyst. Normal gallbladder, without biliary ductal dilatation. Pancreas: Normal, without mass or ductal dilatation. Spleen: Normal in size, without focal abnormality. Adrenals/Urinary Tract: Normal adrenal glands. Left nephrectomy. Normal kidneys, without hydronephrosis. Normal urinary bladder. Stomach/Bowel: Normal stomach, without wall thickening. Colonic stool burden suggests constipation. Normal terminal ileum and  appendix. Normal small bowel caliber. Vascular/Lymphatic: Aortic atherosclerosis. No abdominopelvic adenopathy. Reproductive: Mild prostatomegaly. Other: No significant free fluid. Interval left inguinal hernia repair. No free intraperitoneal air. Musculoskeletal: Bilateral femoral head avascular necrosis. Left L1 and L2 transverse process fractures as on dedicated lumbar spine images. IMPRESSION: 1. Displaced fractures of the left transverse process at L1-2 as well as the left tenth and eleventh ribs. 2. No pneumothorax or other acute finding. 3. Marked left hemidiaphragm elevation. 4. Interval left inguinal hernia repair. 5.  Aortic Atherosclerosis (ICD10-I70.0).  Emphysema (ICD10-J43.9). 6. Bilateral femoral head avascular necrosis 7. Prostatomegaly. Electronically Signed   By: Abigail Miyamoto M.D.   On: 12/02/2020 19:58   CT L-SPINE NO CHARGE  Result Date: 12/02/2020 CLINICAL DATA:  Low back pain after falling EXAM: CT LUMBAR SPINE WITHOUT CONTRAST TECHNIQUE: Multidetector CT imaging of the lumbar spine was performed without intravenous contrast administration. Multiplanar CT image reconstructions were also generated. COMPARISON:  None. FINDINGS: Segmentation: 5 lumbar type vertebrae. Alignment: Normal. Vertebrae: There are minimally displaced fractures of the left transverse processes at L1 and L2. No other fracture. Paraspinal and other soft tissues: Please see dedicated report for CT abdomen pelvis Disc levels: No spinal canal stenosis. Moderate right neural foraminal stenosis at L4-5. IMPRESSION: 1. Minimally displaced fractures of the left transverse processes at L1 and L2. 2. Moderate right L4-5 neural foraminal stenosis. Electronically Signed   By: Ulyses Jarred M.D.   On: 12/02/2020 19:39    Procedures Procedures   Medications Ordered in ED Medications  0.9 %  sodium chloride infusion ( Intravenous Not Given 12/02/20 2114)  ketorolac (TORADOL) 30 MG/ML injection 15 mg (15 mg Intravenous Given  12/02/20 1819)  iohexol (OMNIPAQUE) 300 MG/ML solution 100 mL (100 mLs Intravenous Contrast Given 12/02/20 1913)  ketorolac (TORADOL) 30 MG/ML injection 15 mg (15 mg Intravenous Given 12/02/20 1952)    ED Course  I have reviewed the triage vital signs and the nursing notes.  Pertinent labs & imaging results that were available during my care of the patient were reviewed by me and considered in my medical decision making (see chart for details).    MDM Rules/Calculators/A&P                           Patient Vitals for the past 24 hrs:  BP Temp Temp src Pulse Resp SpO2 Height Weight  12/02/20 2030 (!) 137/91 -- -- 95 16 93 % -- --  12/02/20 1850 114/84 -- -- 98 18 94 % -- --  12/02/20 1607 125/88 98.5 F (36.9 C) Oral (!) 119 18 95 % -- --  12/02/20 1603 -- -- -- -- -- -- $Rem'5\' 10"'zgdg$  (1.778 m) 96.2 kg    9:15 PM Reevaluation with update and discussion. After initial assessment and treatment, an updated evaluation reveals he is amatory easily at this time, with pain upon sitting.  Findings discussed with patient all questions answered. Daleen Bo   Medical Decision Making:  This patient is presenting for evaluation of injuries from fall.  Fall occurred 2 days ago, from height, "20 feet", which does require a range of treatment options, and is a complaint that involves a high risk of morbidity and mortality. The differential diagnoses include fracture, contusion, visceral injury, complication from anticoagulation. I decided to review old records, and in summary middle-aged male presenting after 20 foot fall, 2 days ago, directly from ladder to the ground.  He states he landed on his back.  I did not require additional historical information from anyone.  Clinical Laboratory Tests Ordered, included CBC and Metabolic panel. Review indicates normal except mild elevation of creatinine and glucose. Radiologic Tests Ordered, included CT chest and abdomen, CT lumbar spine.  I independently  Visualized: Radiographic images, which show fracture left rib #1011, left transverse processes number L1-L2   Critical Interventions-clinical evaluation, medication treatment, laboratory testing, CT imaging, observation reassess  After These Interventions, the Patient was reevaluated and was found stable for discharge.  Patient with subacute injury, painful condition, with rib fractures and transverse process fractures.  These do not require intervention at this time.  Patient instructed on care and treatment.  Return to work 12/08/2020 and light duty after that for 2 weeks.  CRITICAL CARE-no Performed by: Daleen Bo  Nursing Notes Reviewed/ Care Coordinated Applicable Imaging Reviewed Interpretation of Laboratory Data incorporated into ED treatment  The patient appears reasonably screened and/or stabilized for discharge and I doubt any other medical condition or other [facility] requiring further screening, evaluation, or treatment in the ED at this time prior to discharge.  Plan: Home Medications-continue; Home Treatments-rest, gradual advance active; return here if the recommended treatment, does not improve the symptoms; Recommended follow up-PCP, PR     Final Clinical Impression(s) / ED Diagnoses Final diagnoses:  Pain  Closed fracture of multiple ribs of left side, initial encounter  Closed fracture of transverse process of cervical vertebra, initial encounter [facility])    Rx / DC Orders ED Discharge Orders         Ordered    HYDROcodone-acetaminophen (NORCO/VICODIN) 5-325 MG tablet  Every 4 hours PRN        12/02/20 2109    oxyCODONE-acetaminophen (PERCOCET) 5-325 MG tablet  Every 4 hours PRN        12/02/20 2113           Daleen Bo, MD 12/02/20 2117

## 2020-12-03 MED FILL — Hydrocodone-Acetaminophen Tab 5-325 MG: ORAL | Qty: 6 | Status: AC

## 2021-12-02 ENCOUNTER — Emergency Department (HOSPITAL_COMMUNITY)
Admission: EM | Admit: 2021-12-02 | Discharge: 2021-12-02 | Disposition: A | Discharge status: Home / Self Care | Payer: 59 | Attending: Emergency Medicine | Admitting: Emergency Medicine

## 2021-12-02 ENCOUNTER — Emergency Department (HOSPITAL_COMMUNITY): Payer: 59

## 2021-12-02 ENCOUNTER — Other Ambulatory Visit: Payer: Self-pay

## 2021-12-02 DIAGNOSIS — Z7901 Long term (current) use of anticoagulants: Secondary | ICD-10-CM | POA: Diagnosis not present

## 2021-12-02 DIAGNOSIS — N3091 Cystitis, unspecified with hematuria: Secondary | ICD-10-CM | POA: Insufficient documentation

## 2021-12-02 DIAGNOSIS — R109 Unspecified abdominal pain: Secondary | ICD-10-CM | POA: Diagnosis present

## 2021-12-02 DIAGNOSIS — Z794 Long term (current) use of insulin: Secondary | ICD-10-CM | POA: Diagnosis not present

## 2021-12-02 DIAGNOSIS — N210 Calculus in bladder: Secondary | ICD-10-CM | POA: Insufficient documentation

## 2021-12-02 LAB — CBC WITH DIFFERENTIAL/PLATELET
Abs Immature Granulocytes: 0.01 10*3/uL (ref 0.00–0.07)
Basophils Absolute: 0.1 10*3/uL (ref 0.0–0.1)
Basophils Relative: 1 %
Eosinophils Absolute: 0 10*3/uL (ref 0.0–0.5)
Eosinophils Relative: 0 %
HCT: 46.6 % (ref 39.0–52.0)
Hemoglobin: 15.4 g/dL (ref 13.0–17.0)
Immature Granulocytes: 0 %
Lymphocytes Relative: 37 %
Lymphs Abs: 2.2 10*3/uL (ref 0.7–4.0)
MCH: 31.5 pg (ref 26.0–34.0)
MCHC: 33 g/dL (ref 30.0–36.0)
MCV: 95.3 fL (ref 80.0–100.0)
Monocytes Absolute: 0.8 10*3/uL (ref 0.1–1.0)
Monocytes Relative: 13 %
Neutro Abs: 2.9 10*3/uL (ref 1.7–7.7)
Neutrophils Relative %: 49 %
Platelets: 201 10*3/uL (ref 150–400)
RBC: 4.89 MIL/uL (ref 4.22–5.81)
RDW: 12.2 % (ref 11.5–15.5)
WBC: 5.9 10*3/uL (ref 4.0–10.5)
nRBC: 0 % (ref 0.0–0.2)

## 2021-12-02 LAB — BASIC METABOLIC PANEL WITH GFR
Anion gap: 9 (ref 5–15)
BUN: 11 mg/dL (ref 6–20)
CO2: 28 mmol/L (ref 22–32)
Calcium: 9.2 mg/dL (ref 8.9–10.3)
Chloride: 99 mmol/L (ref 98–111)
Creatinine, Ser: 1.13 mg/dL (ref 0.61–1.24)
GFR, Estimated: >60 mL/min (ref >60)
Glucose, Bld: 154 mg/dL — ABNORMAL HIGH (ref 70–99)
Potassium: 4.2 mmol/L (ref 3.5–5.1)
Sodium: 136 mmol/L (ref 135–145)

## 2021-12-02 LAB — URINALYSIS, ROUTINE W REFLEX MICROSCOPIC
Bilirubin Urine: NEGATIVE
Glucose, UA: NEGATIVE mg/dL
Ketones, ur: 5 mg/dL — AB
Leukocytes,Ua: NEGATIVE
Nitrite: NEGATIVE
Protein, ur: >=300 mg/dL — AB
RBC / HPF: >50 RBC/hpf — ABNORMAL HIGH (ref 0–5)
Specific Gravity, Urine: 1.027 (ref 1.005–1.030)
pH: 7 (ref 5.0–8.0)

## 2021-12-02 MED ORDER — CEPHALEXIN 500 MG PO CAPS
500.0000 mg | ORAL_CAPSULE | Freq: Once | ORAL | Status: AC
  Administered 2021-12-02: 500 mg via ORAL
  Filled 2021-12-02: qty 1

## 2021-12-02 MED ORDER — ONDANSETRON HCL 4 MG/2ML IJ SOLN
4.0000 mg | Freq: Once | INTRAMUSCULAR | Status: AC
  Administered 2021-12-02: 4 mg via INTRAVENOUS
  Filled 2021-12-02: qty 2

## 2021-12-02 MED ORDER — CEPHALEXIN 500 MG PO CAPS: 500.0000 mg | ORAL_CAPSULE | Freq: Four times a day (QID) | ORAL | 0 refills | Status: DC

## 2021-12-02 MED ORDER — HYDROMORPHONE HCL 1 MG/ML IJ SOLN
1.0000 mg | Freq: Once | INTRAMUSCULAR | Status: AC
  Administered 2021-12-02: 1 mg via INTRAVENOUS
  Filled 2021-12-02: qty 1

## 2021-12-02 MED ORDER — PHENAZOPYRIDINE HCL 200 MG PO TABS: 200.0000 mg | ORAL_TABLET | Freq: Three times a day (TID) | ORAL | 0 refills | Status: DC

## 2021-12-02 NOTE — Discharge Instructions (Addendum)
Continue to drink plenty of water.  Take the antibiotic as directed until finished.  You may take the Pyridium as directed for 2 days.  This will help with spasms in your bladder.  This medication will likely turn your urine orange in color, but this is temporary and will resolve once the medication is finished.  Please follow-up with your primary care provider on Monday for recheck.  Return to the emergency department if you develop any new or worsening symptoms. ?

## 2021-12-02 NOTE — ED Provider Notes (Signed)
?[facility] EMERGENCY DEPARTMENT ?Provider Note ? ? ?CSN: [id number] ?Arrival date & time: 12/02/21  1051 ? ?  ? ?History ? ?Chief Complaint  ?Patient presents with  ? Hematuria  ? ? ?Gregory Andrews is a 61 y.o. male. ? ? ?Hematuria ?Associated symptoms include abdominal pain. Pertinent negatives include no headaches.  ? ?  ? ? ?Gregory Andrews is a 61 y.o. 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  Symptoms began at 4 AM this morning.  He notes having bright red blood when he attempts to void.  Initially, he is only voiding small amounts of urine.  Feels as though he cannot completely empty his bladder.  He notes having some discomfort of his right flank, but pain now mostly in his right groin area.  History of left-sided nephrectomy as a teenager.  Denies any problems with right kidney.  Symptoms today have been associated with nausea, no vomiting.  He denies any chest pain, shortness of breath, vomiting, fever or chills.  No history of prior kidney stones. ? ? ?Home Medications ?Prior to Admission medications   ?Medication Sig Start Date End Date Taking? Authorizing Provider  ?apixaban (ELIQUIS) 5 MG TABS tablet Take 1 tablet (5 mg total) by mouth 2 (two) times daily. In place of Coumadin 10/10/17   Emokpae, Courage, MD  ?atorvastatin (LIPITOR) 20 MG tablet Take 20 mg by mouth at bedtime. 04/25/20   [provider]  ?blood glucose meter kit and supplies Dispense  1 Relion Prime glucometer, 1 box of test strips, lancets, alcohol wipes and cotton balls. Use up to four times daily as directed. (FOR ICD-9 250.00, 250.01). 10/09/17   Emokpae, Courage, MD  ?Blood Glucose Monitoring Suppl (RELION PRIME MONITOR) DEVI 1 Device by Does not apply route as directed. Glucometer 10/09/17   Emokpae, Courage, MD  ?glimepiride (AMARYL) 2 MG  tablet Take 1 tablet (2 mg total) by mouth 2 (two) times daily before a meal. Before Breakfast and Supper ?Patient not taking: Reported on 05/26/2020 10/09/17   Emokpae, Courage, MD  ?glucose blood (RELION PRIME TEST) test strip Use as instructed to test blood sugar before meals and before bedtime 10/09/17   Emokpae, Courage, MD  ?HYDROcodone-acetaminophen (NORCO/VICODIN) 5-325 MG tablet Take 2 tablets by mouth every 4 (four) hours as needed. 12/02/20   Wentz, Elliott, MD  ?insulin glargine (LANTUS) 100 UNIT/ML injection Take 20 units every evening ?Patient not taking: Reported on 05/26/2020 10/09/17   Emokpae, Courage, MD  ?lisinopril (PRINIVIL,ZESTRIL) 10 MG tablet Take 1 tablet (10 mg total) by mouth daily. For Blood Pressure and Kidney Protection 10/09/17 05/26/20  Emokpae, Courage, MD  ?metFORMIN (GLUCOPHAGE) 850 MG tablet Take 1 tablet (850 mg total) by mouth 2 (two) times daily with a meal. Breakfast and Supper ?Patient not taking: Reported on 05/26/2020 10/09/17 10/09/18  Emokpae, Courage, MD  ?oxyCODONE-acetaminophen (PERCOCET) 5-325 MG tablet Take 1 tablet by mouth every 4 (four) hours as needed for severe pain. 12/02/20   Wentz, Elliott, MD  ?pantoprazole (PROTONIX) 20 MG tablet Take 1 tablet (20 mg total) by mouth daily. ?Patient not taking: Reported on 05/26/2020 04/29/20   Idol, Julie, PA-C  ?   ? ?Allergies    ?Codeine   ? ?Review of Systems   ?Review of Systems  ?Constitutional:  Negative for chills and fever.  ?Gastrointestinal:  Positive for abdominal   pain and nausea. Negative for diarrhea and vomiting.  ?Genitourinary:  Positive for decreased urine volume, flank pain, frequency, hematuria and urgency.  ?Skin:  Negative for rash.  ?Neurological:  Negative for weakness, numbness and headaches.  ?All other systems reviewed and are negative. ? ?Physical Exam ?Updated Vital Signs ?BP (!) 145/93 (BP Location: Left Arm)   Pulse 88   Temp 98.5 ?F (36.9 ?C) (Oral)   Resp 17   Ht 5' 10" (1.778 m)   Wt 90.7 kg    SpO2 100%   BMI 28.70 kg/m?  ?Physical Exam ?Vitals and nursing note reviewed.  ?Constitutional:   ?   General: He is not in acute distress. ?   Appearance: Normal appearance. He is not toxic-appearing or diaphoretic.  ?   Comments: Patient is uncomfortable appearing  ?HENT:  ?   Mouth/Throat:  ?   Mouth: Mucous membranes are moist.  ?Cardiovascular:  ?   Rate and Rhythm: Normal rate and regular rhythm.  ?   Pulses: Normal pulses.  ?Pulmonary:  ?   Effort: Pulmonary effort is normal.  ?   Breath sounds: Normal breath sounds.  ?Chest:  ?   Chest wall: No tenderness.  ?Abdominal:  ?   Palpations: Abdomen is soft.  ?   Tenderness: There is no abdominal tenderness. There is no right CVA tenderness or left CVA tenderness.  ?Musculoskeletal:     ?   General: Normal range of motion.  ?Skin: ?   General: Skin is warm.  ?   Capillary Refill: Capillary refill takes less than 2 seconds.  ?   Findings: No rash.  ?Neurological:  ?   General: No focal deficit present.  ?   Mental Status: He is alert.  ? ? ?ED Results / Procedures / Treatments   ?Labs ?(all labs ordered are listed, but only abnormal results are displayed) ?Labs Reviewed  ?URINALYSIS, ROUTINE W REFLEX MICROSCOPIC - Abnormal; Notable for the following components:  ?    Result Value  ? Color, Urine AMBER (*)   ? APPearance HAZY (*)   ? Hgb urine dipstick LARGE (*)   ? Ketones, ur 5 (*)   ? Protein, ur >=300 (*)   ? RBC / HPF >50 (*)   ? Bacteria, UA RARE (*)   ? All other components within normal limits  ?BASIC METABOLIC PANEL - Abnormal; Notable for the following components:  ? Glucose, Bld 154 (*)   ? All other components within normal limits  ?URINE CULTURE  ?CBC WITH DIFFERENTIAL/PLATELET  ? ? ?EKG ?None ? ?Radiology ?CT Renal Stone Study ? ?Result Date: 12/02/2021 ?CLINICAL DATA:  Right flank pain and hematuria. EXAM: CT ABDOMEN AND PELVIS WITHOUT CONTRAST TECHNIQUE: Multidetector CT imaging of the abdomen and pelvis was performed following the standard protocol  without IV contrast. RADIATION DOSE REDUCTION: This exam was performed according to the departmental dose-optimization program which includes automated exposure control, adjustment of the mA and/or kV according to patient size and/or use of iterative reconstruction technique. COMPARISON:  CT abdomen pelvis dated December 02, 2020. FINDINGS: Lower chest: No acute abnormality. Unchanged marked elevation of the left hemidiaphragm. Hepatobiliary: Unchanged simple cyst in the left liver. No new focal liver abnormality. Unchanged layering sludge within the gallbladder. No gallbladder wall thickening or biliary dilatation. Pancreas: Unremarkable. No pancreatic ductal dilatation or surrounding inflammatory changes. Spleen: Normal in size without focal abnormality. Adrenals/Urinary Tract: Adrenal glands are unremarkable. Prior left nephrectomy. No renal calculi or hydronephrosis. New layering calcification  in the bladder, likely multiple tiny calculi. Stomach/Bowel: Stomach is within normal limits. Appendix appears normal. No evidence of bowel wall thickening, distention, or inflammatory changes. Vascular/Lymphatic: Aortic atherosclerosis. No enlarged abdominal or pelvic lymph nodes. Reproductive: Unchanged moderate prostatomegaly. Other: Prior left inguinal hernia repair. No free fluid or pneumoperitoneum. Musculoskeletal: No new acute or significant osseous finding. 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 IMPRESSION: 1. No nephrolithiasis or hydronephrosis. 2. New multiple layering tiny calculi in the bladder. 3. Prior left nephrectomy. 4. Unchanged severe elevation of the left hemidiaphragm. 5. Aortic Atherosclerosis (ICD10-I70.0). Electronically Signed   By: Titus Dubin M.D.   On: 12/02/2021 14:28   ? ?Procedures ?Procedures  ? ? ?Medications Ordered in ED ?Medications - No data to display ? ?ED Course/ Medical Decision Making/ A&P ?  ?                        ?Medical Decision Making ?Patient here  for evaluation of hematuria, decreased urination, flank pain and urinary urgency.  History of left-sided nephrectomy as a teenager.  Denies any abnormal kidney functions of the right kidney.  No history of kidney

## 2021-12-02 NOTE — ED Notes (Signed)
Pt bladder scan showed approximately 75 ml urine in bladder. Pt stated he did not feel the urge to urinate prior to exam, after exam completed pt ambulated to restroom. ?

## 2021-12-02 NOTE — ED Notes (Signed)
Pt ambulated to restroom X 2. C/o increased pain and pressure when urinating.  ?

## 2021-12-02 NOTE — ED Triage Notes (Addendum)
Pt states at 4 am he began to have blood in his urine, small amount of urine produces and feels as if he cannot empty his bladder. Experiences pain with urination.  ?Pt surgical hx shows L kidney removal ?

## 2021-12-04 LAB — URINE CULTURE: Culture: <10000 — AB

## 2022-03-13 ENCOUNTER — Other Ambulatory Visit: Payer: Self-pay

## 2022-03-13 ENCOUNTER — Encounter (HOSPITAL_COMMUNITY): Payer: Self-pay | Admitting: *Deleted

## 2022-03-13 ENCOUNTER — Emergency Department (HOSPITAL_COMMUNITY)
Admission: EM | Admit: 2022-03-13 | Discharge: 2022-03-13 | Disposition: A | Discharge status: Home / Self Care | Payer: 59 | Attending: Emergency Medicine | Admitting: Emergency Medicine

## 2022-03-13 DIAGNOSIS — Z20822 Contact with and (suspected) exposure to covid-19: Secondary | ICD-10-CM | POA: Diagnosis not present

## 2022-03-13 DIAGNOSIS — J029 Acute pharyngitis, unspecified: Secondary | ICD-10-CM | POA: Insufficient documentation

## 2022-03-13 DIAGNOSIS — H6122 Impacted cerumen, left ear: Secondary | ICD-10-CM | POA: Insufficient documentation

## 2022-03-13 DIAGNOSIS — Z7901 Long term (current) use of anticoagulants: Secondary | ICD-10-CM | POA: Diagnosis not present

## 2022-03-13 LAB — RESP PANEL BY RT-PCR (FLU A&B, COVID) ARPGX2
Influenza A by PCR: NEGATIVE
Influenza B by PCR: NEGATIVE
SARS Coronavirus 2 by RT PCR: NEGATIVE

## 2022-03-13 LAB — GROUP A STREP BY PCR: Group A Strep by PCR: NOT DETECTED

## 2022-03-13 MED ORDER — HYDROGEN PEROXIDE 3 % EX SOLN
CUTANEOUS | Status: AC
  Filled 2022-03-13: qty 473

## 2022-03-13 MED ORDER — PREDNISONE 20 MG PO TABS: 40.0000 mg | ORAL_TABLET | Freq: Every day | ORAL | 0 refills | Status: DC

## 2022-03-13 MED ORDER — LIDOCAINE VISCOUS HCL 2 % MT SOLN: 15.0000 mL | OROMUCOSAL | 0 refills | Status: DC | PRN

## 2022-03-13 MED ORDER — LIDOCAINE VISCOUS HCL 2 % MT SOLN
15.0000 mL | Freq: Once | OROMUCOSAL | Status: AC
  Administered 2022-03-13: 15 mL via OROMUCOSAL
  Filled 2022-03-13: qty 15

## 2022-03-13 NOTE — Discharge Instructions (Signed)
Please follow-up with your primary care provider within the next week to ensure we are going in the right direction.  Please return to the emergency department for any worsening symptoms you might have.

## 2022-03-13 NOTE — ED Triage Notes (Signed)
Pt c/o sore throat, difficulty swallowing, left ear pain since Wednesday. Pt able to tolerate his own secretions. Tonsils swollen with Samy Ryner patches present. Unknown fever PTA.

## 2022-03-13 NOTE — ED Provider Notes (Signed)
[facility] EMERGENCY DEPARTMENT Provider Note   CSN: [id number] Arrival date & time: 03/13/22  4562     History Chief Complaint  Patient presents with   Sore Throat    Gregory Andrews is a 61 y.o. male patient who presents to the emergency department with a 4-day history of sore throat and ear pain.  Pain is worse with swallowing but is able to swallow without difficulty.  No trouble breathing, fever, chills.  Denies cough.  Denies sick contacts.   Sore Throat       Home Medications Prior to Admission medications   Medication Sig Start Date End Date Taking? Authorizing Provider  lidocaine (XYLOCAINE) 2 % solution Use as directed 15 mLs in the mouth or throat as needed for mouth pain. 03/13/22  Yes Raul Del, Quanisha Drewry M, PA-C  predniSONE (DELTASONE) 20 MG tablet Take 2 tablets (40 mg total) by mouth daily. 03/13/22  Yes Raul Del, Reshanda Lewey M, PA-C  apixaban (ELIQUIS) 5 MG TABS tablet Take 1 tablet (5 mg total) by mouth 2 (two) times daily. In place of Coumadin 10/10/17   Roxan Hockey, MD  atorvastatin (LIPITOR) 20 MG tablet Take 20 mg by mouth at bedtime. 04/25/20   [provider]  blood glucose meter kit and supplies Dispense  1 Relion Prime glucometer, 1 box of test strips, lancets, alcohol wipes and cotton balls. Use up to four times daily as directed. (FOR ICD-9 250.00, 250.01). 10/09/17   Roxan Hockey, MD  Blood Glucose Monitoring Suppl (RELION PRIME MONITOR) DEVI 1 Device by Does not apply route as directed. Glucometer 10/09/17   Roxan Hockey, MD  cephALEXin (KEFLEX) 500 MG capsule Take 1 capsule (500 mg total) by mouth 4 (four) times daily. 12/02/21   Triplett, Tammy, PA-C  glimepiride (AMARYL) 2 MG tablet Take 1 tablet (2 mg total) by mouth 2 (two) times daily before a meal. Before Breakfast and Supper Patient not taking: Reported on 05/26/2020 10/09/17   Roxan Hockey, MD  glucose blood (RELION PRIME TEST) test strip Use as instructed to test blood sugar before  meals and before bedtime 10/09/17   Roxan Hockey, MD  HYDROcodone-acetaminophen (NORCO/VICODIN) 5-325 MG tablet Take 2 tablets by mouth every 4 (four) hours as needed. 12/02/20   Daleen Bo, MD  insulin glargine (LANTUS) 100 UNIT/ML injection Take 20 units every evening Patient not taking: Reported on 05/26/2020 10/09/17   Roxan Hockey, MD  lisinopril (PRINIVIL,ZESTRIL) 10 MG tablet Take 1 tablet (10 mg total) by mouth daily. For Blood Pressure and Kidney Protection 10/09/17 05/26/20  Roxan Hockey, MD  metFORMIN (GLUCOPHAGE) 850 MG tablet Take 1 tablet (850 mg total) by mouth 2 (two) times daily with a meal. Breakfast and Supper Patient not taking: Reported on 05/26/2020 10/09/17 10/09/18  Roxan Hockey, MD  oxyCODONE-acetaminophen (PERCOCET) 5-325 MG tablet Take 1 tablet by mouth every 4 (four) hours as needed for severe pain. 12/02/20   Daleen Bo, MD  pantoprazole (PROTONIX) 20 MG tablet Take 1 tablet (20 mg total) by mouth daily. Patient not taking: Reported on 05/26/2020 04/29/20   Evalee Jefferson, PA-C  phenazopyridine (PYRIDIUM) 200 MG tablet Take 1 tablet (200 mg total) by mouth 3 (three) times daily. 12/02/21   Triplett, Tammy, PA-C      Allergies    Codeine    Review of Systems   Review of Systems  All other systems reviewed and are negative.   Physical Exam Updated Vital Signs BP 111/76 (BP Location: Right Arm)   Pulse (!) 104  Temp 99.2 F (37.3 C) (Oral)   Resp 14   Ht $R'5\' 10"'wC$  (1.778 m)   Wt 83.9 kg   SpO2 98%   BMI 26.54 kg/m  Physical Exam Vitals and nursing note reviewed.  Constitutional:      Appearance: Normal appearance.  HENT:     Head: Normocephalic and atraumatic.     Right Ear: Tympanic membrane, ear canal and external ear normal.     Left Ear: Ear canal and external ear normal. There is impacted cerumen.     Mouth/Throat:     Pharynx: Uvula midline. No pharyngeal swelling.     Tonsils: Tonsillar exudate present.  Eyes:     General:         Right eye: No discharge.        Left eye: No discharge.     Conjunctiva/sclera: Conjunctivae normal.  Pulmonary:     Effort: Pulmonary effort is normal.  Skin:    General: Skin is warm and dry.     Findings: No rash.  Neurological:     General: No focal deficit present.     Mental Status: He is alert.  Psychiatric:        Mood and Affect: Mood normal.        Behavior: Behavior normal.     ED Results / Procedures / Treatments   Labs (all labs ordered are listed, but only abnormal results are displayed) Labs Reviewed  GROUP A STREP BY PCR  RESP PANEL BY RT-PCR (FLU A&B, COVID) [id number]    EKG None  Radiology No results found.  Procedures Procedures    Medications Ordered in ED Medications  lidocaine (XYLOCAINE) 2 % viscous mouth solution 15 mL (15 mLs Mouth/Throat Given 03/13/22 1020)  hydrogen peroxide 3 % external solution (  Given 03/13/22 1021)    ED Course/ Medical Decision Making/ A&P Clinical Course as of 03/13/22 1124  Sat Mar 13, 2022  1059 Resp Panel by RT-PCR (Flu A&B, Covid) Anterior Nasal Swab Respiratory panel is negative. [CF]  1100 Group A Strep by PCR Group A strep negative. [CF]  8616 On reevaluation, patient states his throat is feeling better after viscous lidocaine.  Do not see any bacterial indication to start him on antibiotics today.  This is likely a viral pharyngitis.  I am going to start him on short course of steroids in addition to prescribing him viscous lidocaine for symptomatic control.  Patient met with his plan.  He is safer discharge at this time. [CF]  1124 Left cerumen impaction was successfully irrigated and I was able to visualize the tympanic membrane without any evidence of erythema or bulging. [CF]    Clinical Course User Index [CF] Hendricks Limes, PA-C                           Medical Decision Making Gregory Andrews is a 61 y.o. male patient presents to the emerged department with sore throat and ear pain.  I am unable  to visualize the left TM secondary to cerumen impaction.  We will get this irrigated so I can visualize.  Patient has some tonsillar exudate primarily on the left tonsil.  No significant tonsillar hypertrophy however.  Going to swab the patient for COVID, flu, and strep.  I am also going to give him some viscous lidocaine to help with his throat pain.  Patient is nontoxic-appearing and has no evidence of inspiratory stridor.  I  have a low suspicion for RPA or PTA at this time.   Amount and/or Complexity of Data Reviewed Labs:  Decision-making details documented in ED Course.  Risk Prescription drug management. Decision regarding hospitalization. Risk Details: Patient is feeling better after viscous lidocaine.  This is likely viral pharyngitis considering that the strep was negative.  Do not feel that antibiotics are indicated at this time.  Going to prescribe him a short course of steroids in addition to viscous lidocaine.  He is safe for discharge at this time.  Strict return precautions were discussed. He will followup with his PCP within the next week.    Final Clinical Impression(s) / ED Diagnoses Final diagnoses:  Pharyngitis, unspecified etiology    Rx / DC Orders ED Discharge Orders          Ordered    predniSONE (DELTASONE) 20 MG tablet  Daily        03/13/22 1122    lidocaine (XYLOCAINE) 2 % solution  As needed        03/13/22 1122              Myna Bright [place], [place] 03/13/22 1125    Carmin Muskrat, MD 03/13/22 1456

## 2022-04-07 ENCOUNTER — Encounter: Payer: Self-pay | Admitting: Nurse Practitioner

## 2022-04-07 ENCOUNTER — Ambulatory Visit (INDEPENDENT_AMBULATORY_CARE_PROVIDER_SITE_OTHER): Payer: 59 | Admitting: Nurse Practitioner

## 2022-04-07 ENCOUNTER — Other Ambulatory Visit: Payer: Self-pay

## 2022-04-07 VITALS — BP 102/68 | HR 93 | Ht 70.0 in | Wt 178.0 lb

## 2022-04-07 DIAGNOSIS — E1169 Type 2 diabetes mellitus with other specified complication: Secondary | ICD-10-CM

## 2022-04-07 DIAGNOSIS — Z7901 Long term (current) use of anticoagulants: Secondary | ICD-10-CM | POA: Diagnosis not present

## 2022-04-07 DIAGNOSIS — E1165 Type 2 diabetes mellitus with hyperglycemia: Secondary | ICD-10-CM | POA: Diagnosis not present

## 2022-04-07 DIAGNOSIS — I10 Essential (primary) hypertension: Secondary | ICD-10-CM

## 2022-04-07 DIAGNOSIS — E785 Hyperlipidemia, unspecified: Secondary | ICD-10-CM

## 2022-04-07 MED ORDER — APIXABAN 5 MG PO TABS: 5.0000 mg | ORAL_TABLET | Freq: Two times a day (BID) | ORAL | 1 refills | Status: DC

## 2022-04-07 MED ORDER — ATORVASTATIN CALCIUM 20 MG PO TABS: 20.0000 mg | ORAL_TABLET | Freq: Every day | ORAL | 0 refills | Status: DC

## 2022-04-07 MED ORDER — APIXABAN 5 MG PO TABS
5.0000 mg | ORAL_TABLET | Freq: Two times a day (BID) | ORAL | 1 refills | Status: DC
Start: 2022-04-07 — End: 2022-04-07

## 2022-04-07 MED ORDER — LISINOPRIL 10 MG PO TABS: 10.0000 mg | ORAL_TABLET | Freq: Every day | ORAL | 1 refills | Status: DC

## 2022-04-07 NOTE — Assessment & Plan Note (Signed)
Chronic condition On Eliquis 5 mg twice daily Patient stated that he has been taking Eliquis 5 mg daily instead. Need to take medication as prescribed to avoid recurrent DVT and pulmonary embolism discussed he verbalized understanding. Eliquis refilled today 

## 2022-04-07 NOTE — Patient Instructions (Addendum)
Please get your fasting labs done 3-5 days before your next appointment   Please schedule your diabetic eye exam as dicussed   Goal for fasting blood sugar ranges from 80 to 120 and 2 hours after any meal or at bedtime should be between 130 to 170.   It is important that you exercise regularly at least 30 minutes 5 times a week.  Think about what you will eat, plan ahead. Choose " clean, green, fresh or frozen" over canned, processed or packaged foods which are more sugary, salty and fatty. 70 to 75% of food eaten should be vegetables and fruit. Three meals at set times with snacks allowed between meals, but they must be fruit or vegetables. Aim to eat over a 12 hour period , example 7 am to 7 pm, and STOP after  your last meal of the day. Drink water,generally about 64 ounces per day, no other drink is as healthy. Fruit juice is best enjoyed in a healthy way, by EATING the fruit.  Thanks for choosing [facility], we consider it a privelige to serve you.

## 2022-04-07 NOTE — Assessment & Plan Note (Signed)
BP Readings from Last 3 Encounters:  04/07/22 102/68  03/13/22 107/78  12/02/21 (!) 148/92  Condition well-controlled On lisinopril 10 mg daily Medication refill today Check BMP in 2 months.

## 2022-04-07 NOTE — Assessment & Plan Note (Addendum)
He has not been taking any diabetic medications for years  Reports fasting CBG of 134 this am.  Avoid sugar sweets soda Check A1c, urine creatinine albumin , foot exam at next visit. Will restart medication at next visit based on his A1c levels Patient encouraged to continue to monitor CBG at home Atorvastatin and lisinopril Patient encouraged to schedule his diabetic eye exam

## 2022-04-07 NOTE — Progress Notes (Signed)
New Patient Office Visit  Subjective    Patient ID: Gregory Andrews, male    DOB: 24-Dec-1960  Age: 61 y.o. MRN: [id number]  CC:  Chief Complaint  Patient presents with   New Patient (Initial Visit)    np    HPI Gregory Andrews with past medical history of hypertension, type 2 diabetes, hyperlipidemia, DVT, pulmonary embolism presents to establish care for his chronic medical conditions. Previous PCP Dr Criss Rosales at [facility] in [place]. Last visit was 3 months ago .        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  Currently on Eliquis 5mg  BID , patient stated that he has been taking Eliquis 5 mg daily instead of twice daily.  Patient denies blood in the stool, hematemesis, SOB  Hyperlipidemia . takes atorvastatin 20mg  twice a week due to diarrhea.  States that taking the medication daily gives him diarrhea.   Type 2 diabetes .  Patient stated that has not been taking any of his diabetic medications for many years ago , due to hypoglycemia. Fasting blood sugar was 134 this morning. He stated that he has cut down on sugar and sweets.   Patient declined colon cancer screening , need for colon cancer screening discussed.  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   States that he has had his shingles  vaccine  at the [facility] in [place].   Due for diabetic eye exam ,pt stated that he will schedule diabetic eye exam at my eye doctor.    Outpatient Encounter Medications as of 04/07/2022  Medication Sig   blood glucose meter kit and supplies Dispense  1 Relion Prime glucometer, 1 box of test strips, lancets, alcohol wipes and cotton balls. Use up to four times daily as directed. (FOR ICD-9 250.00, 250.01).   Blood Glucose Monitoring Suppl (RELION PRIME MONITOR) DEVI 1 Device by Does not apply route as directed. Glucometer   glucose blood (RELION PRIME TEST) test strip Use as instructed to test blood sugar before  meals and before bedtime   [DISCONTINUED] apixaban (ELIQUIS) 5 MG TABS tablet Take 1 tablet (5 mg total) by mouth 2 (two) times daily. In place of Coumadin   [DISCONTINUED] lisinopril (ZESTRIL) 10 MG tablet Take by mouth.   apixaban (ELIQUIS) 5 MG TABS tablet Take 1 tablet (5 mg total) by mouth 2 (two) times daily. In place of Coumadin   atorvastatin (LIPITOR) 20 MG tablet Take 1 tablet (20 mg total) by mouth at bedtime.   glimepiride (AMARYL) 2 MG tablet Take 1 tablet (2 mg total) by mouth 2 (two) times daily before a meal. Before Breakfast and Supper (Patient not taking: Reported on 05/26/2020)   insulin glargine (LANTUS) 100 UNIT/ML injection Take 20 units every evening (Patient not taking: Reported on 05/26/2020)   lisinopril (ZESTRIL) 10 MG tablet Take 1 tablet (10 mg total) by mouth daily.   metFORMIN (GLUCOPHAGE) 850 MG tablet Take 1 tablet (850 mg total) by mouth 2 (two) times daily with a meal. Breakfast and Supper (Patient not taking: Reported on 05/26/2020)   pantoprazole (PROTONIX) 20 MG tablet Take 1 tablet (20 mg total) by mouth daily. (Patient not taking: Reported on 05/26/2020)   [DISCONTINUED] atorvastatin (LIPITOR) 20 MG tablet Take 20 mg by mouth at bedtime. (Patient not taking: Reported on 04/07/2022)   [DISCONTINUED] cephALEXin (KEFLEX) 500 MG capsule Take 1 capsule (500 mg total) by mouth 4 (four)  times daily. (Patient not taking: Reported on 04/07/2022)   [DISCONTINUED] HYDROcodone-acetaminophen (NORCO/VICODIN) 5-325 MG tablet Take 2 tablets by mouth every 4 (four) hours as needed. (Patient not taking: Reported on 04/07/2022)   [DISCONTINUED] lidocaine (XYLOCAINE) 2 % solution Use as directed 15 mLs in the mouth or throat as needed for mouth pain. (Patient not taking: Reported on 04/07/2022)   [DISCONTINUED] lisinopril (PRINIVIL,ZESTRIL) 10 MG tablet Take 1 tablet (10 mg total) by mouth daily. For Blood Pressure and Kidney Protection   [DISCONTINUED] oxyCODONE-acetaminophen (PERCOCET)  5-325 MG tablet Take 1 tablet by mouth every 4 (four) hours as needed for severe pain. (Patient not taking: Reported on 04/07/2022)   [DISCONTINUED] phenazopyridine (PYRIDIUM) 200 MG tablet Take 1 tablet (200 mg total) by mouth 3 (three) times daily. (Patient not taking: Reported on 04/07/2022)   [DISCONTINUED] predniSONE (DELTASONE) 20 MG tablet Take 2 tablets (40 mg total) by mouth daily. (Patient not taking: Reported on 04/07/2022)   No facility-administered encounter medications on file as of 04/07/2022.    Past Medical History:  Diagnosis Date   BPH (benign prostatic hyperplasia)    Diabetes mellitus without complication ([place])    Hyperlipidemia    Hypertension    Inguinal hernia    Noncompliance 01/21/2012   Missed appointments and lab appointments   Pulmonary embolism ([place])    Recurrent DVT (deep venous thrombosis) 01/21/2012   On life-long Coumadin   Recurrent pulmonary embolism 01/21/2012   On life-long Coumadin   Shoulder separation    left    Past Surgical History:  Procedure Laterality Date   INGUINAL HERNIA REPAIR Left 05/26/2020   Procedure: LEFT INGUINAL HERNIORRHAPY WITH MESH;  Surgeon: Aviva Signs, MD;  Location: [facility];  Service: General;  Laterality: Left;   left kidney removed     right knee surgery     UMBILICAL HERNIA REPAIR  05/26/2020   Procedure: UMBILICAL HERNIORRHAPY WITH MESH;  Surgeon: Aviva Signs, MD;  Location: [facility];  Service: General;;    Family History  Problem Relation Age of Onset   Breast cancer Mother    Memory loss Father    Brain cancer Sister    Anesthesia problems Neg Hx    Prostate cancer Neg Hx    Cancer - Colon Neg Hx     Social History   Socioeconomic History   Marital status: Divorced    Spouse name: Not on file   Number of children: 1   Years of education: Not on file   Highest education level: Not on file  Occupational History   Not on file  Tobacco Use   Smoking status: Former    Types: Cigarettes    Quit date:  09/13/2020    Years since quitting: 1.5   Smokeless tobacco: Former    Quit date: 01/12/2012  Vaping Use   Vaping Use: Former  Substance and Sexual Activity   Alcohol use: Not Currently    Comment: occas   Drug use: No   Sexual activity: Yes  Other Topics Concern   Not on file  Social History Narrative   Lives home alone    Social Determinants of Health   Financial Resource Strain: Not on file  Food Insecurity: Not on file  Transportation Needs: Not on file  Physical Activity: Not on file  Stress: Not on file  Social Connections: Not on file  Intimate Partner Violence: Not on file    Review of Systems  Constitutional: Negative.  Negative for chills, fever, malaise/fatigue and  weight loss.  Respiratory: Negative.  Negative for cough, hemoptysis, sputum production, shortness of breath and wheezing.   Cardiovascular: Negative.  Negative for chest pain, palpitations, orthopnea and leg swelling.  Gastrointestinal: Negative.  Negative for abdominal pain, blood in stool and melena.  Psychiatric/Behavioral: Negative.  Negative for depression, hallucinations, substance abuse and suicidal ideas.         Objective    BP 102/68 (BP Location: Right Arm, Patient Position: Sitting, Cuff Size: Normal)   Pulse 93   Ht $R'5\' 10"'TN$  (1.778 m)   Wt 178 lb (80.7 kg)   SpO2 90%   BMI 25.54 kg/m   Physical Exam Constitutional:      General: He is not in acute distress.    Appearance: Normal appearance. He is not ill-appearing, toxic-appearing or diaphoretic.  Cardiovascular:     Rate and Rhythm: Normal rate and regular rhythm.     Pulses: Normal pulses.     Heart sounds: Normal heart sounds. No murmur heard.    No friction rub. No gallop.  Pulmonary:     Effort: Pulmonary effort is normal. No respiratory distress.     Breath sounds: Normal breath sounds. No stridor. No wheezing, rhonchi or rales.  Chest:     Chest wall: No tenderness.  Musculoskeletal:        General: No swelling or  deformity.     Right lower leg: No edema.     Left lower leg: No edema.  Skin:    General: Skin is warm and dry.     Coloration: Skin is not jaundiced or pale.     Findings: No erythema.  Neurological:     Mental Status: He is alert and oriented to person, place, and time.     Cranial Nerves: No cranial nerve deficit.     Motor: No weakness.     Coordination: Coordination normal.     Gait: Gait normal.  Psychiatric:        Mood and Affect: Mood normal.        Behavior: Behavior normal.        Thought Content: Thought content normal.        Judgment: Judgment normal.         Assessment & Plan:   Problem List Items Addressed This Visit       Cardiovascular and Mediastinum   Essential hypertension    BP Readings from Last 3 Encounters:  04/07/22 102/68  03/13/22 107/78  12/02/21 (!) 148/92  Condition well-controlled On lisinopril 10 mg daily Medication refill today Check BMP in 2 months.        Relevant Medications   apixaban (ELIQUIS) 5 MG TABS tablet   atorvastatin (LIPITOR) 20 MG tablet   lisinopril (ZESTRIL) 10 MG tablet   Other Relevant Orders   CMP14+EGFR     Endocrine   Type 2 diabetes mellitus with other specified complication ([place]) - Primary    He has not been taking any diabetic medications for years  Reports fasting CBG of 134 this am.  Avoid sugar sweets soda Check A1c, urine creatinine albumin , foot exam at next visit. Will restart medication at next visit based on his A1c levels Patient encouraged to continue to monitor CBG at home Atorvastatin and lisinopril Patient encouraged to schedule his diabetic eye exam      Relevant Medications   atorvastatin (LIPITOR) 20 MG tablet   lisinopril (ZESTRIL) 10 MG tablet   Hyperlipidemia associated with type 2 diabetes mellitus ([place])  Atorvastatin 20 mg daily Patient stated that he has been taking atorvastatin 20 mg twice a week due to diarrhea Avoid fatty fried foods LDL goal is less than 70 We  will check lipid panel before next visit      Relevant Medications   apixaban (ELIQUIS) 5 MG TABS tablet   atorvastatin (LIPITOR) 20 MG tablet   lisinopril (ZESTRIL) 10 MG tablet   Other Relevant Orders   Lipid Profile     Other   Chronic anticoagulation for Recurrent DVT/PE    Chronic condition On Eliquis 5 mg twice daily Patient stated that he has been taking Eliquis 5 mg daily instead. Need to take medication as prescribed to avoid recurrent DVT and pulmonary embolism discussed he verbalized understanding. Eliquis refilled today      Relevant Medications   apixaban (ELIQUIS) 5 MG TABS tablet    Return in about 2 months (around 06/08/2022) for follw up for HTN/DM/HLD.   Renee Rival, FNP

## 2022-04-07 NOTE — Assessment & Plan Note (Deleted)
Chronic condition On Eliquis 5 mg twice daily Patient stated that he has been taking Eliquis 5 mg daily instead. Need to take medication as prescribed to avoid recurrent DVT and pulmonary embolism discussed he verbalized understanding. Eliquis refilled today

## 2022-04-07 NOTE — Assessment & Plan Note (Signed)
Atorvastatin 20 mg daily Patient stated that he has been taking atorvastatin 20 mg twice a week due to diarrhea Avoid fatty fried foods LDL goal is less than 70 We will check lipid panel before next visit

## 2022-04-11 ENCOUNTER — Emergency Department (HOSPITAL_COMMUNITY)
Admission: EM | Admit: 2022-04-11 | Discharge: 2022-04-11 | Disposition: A | Discharge status: Home / Self Care | Payer: 59 | Attending: Emergency Medicine | Admitting: Emergency Medicine

## 2022-04-11 ENCOUNTER — Emergency Department (HOSPITAL_COMMUNITY): Payer: 59

## 2022-04-11 ENCOUNTER — Encounter (HOSPITAL_COMMUNITY): Payer: Self-pay

## 2022-04-11 ENCOUNTER — Other Ambulatory Visit: Payer: Self-pay

## 2022-04-11 DIAGNOSIS — Z7984 Long term (current) use of oral hypoglycemic drugs: Secondary | ICD-10-CM | POA: Insufficient documentation

## 2022-04-11 DIAGNOSIS — R1031 Right lower quadrant pain: Secondary | ICD-10-CM | POA: Insufficient documentation

## 2022-04-11 DIAGNOSIS — Z794 Long term (current) use of insulin: Secondary | ICD-10-CM | POA: Insufficient documentation

## 2022-04-11 DIAGNOSIS — Z7901 Long term (current) use of anticoagulants: Secondary | ICD-10-CM | POA: Insufficient documentation

## 2022-04-11 DIAGNOSIS — N200 Calculus of kidney: Secondary | ICD-10-CM

## 2022-04-11 DIAGNOSIS — E119 Type 2 diabetes mellitus without complications: Secondary | ICD-10-CM | POA: Insufficient documentation

## 2022-04-11 LAB — URINALYSIS, ROUTINE W REFLEX MICROSCOPIC
Bacteria, UA: NONE SEEN
Bilirubin Urine: NEGATIVE
Glucose, UA: 50 mg/dL — AB
Ketones, ur: NEGATIVE mg/dL
Leukocytes,Ua: NEGATIVE
Nitrite: NEGATIVE
Protein, ur: >=300 mg/dL — AB
RBC / HPF: >50 RBC/hpf — ABNORMAL HIGH (ref 0–5)
Specific Gravity, Urine: 1.032 — ABNORMAL HIGH (ref 1.005–1.030)
WBC, UA: >50 WBC/hpf — ABNORMAL HIGH (ref 0–5)
pH: 5 (ref 5.0–8.0)

## 2022-04-11 LAB — CBC WITH DIFFERENTIAL/PLATELET
Abs Immature Granulocytes: 0.02 10*3/uL (ref 0.00–0.07)
Basophils Absolute: 0 10*3/uL (ref 0.0–0.1)
Basophils Relative: 1 %
Eosinophils Absolute: 0.1 10*3/uL (ref 0.0–0.5)
Eosinophils Relative: 1 %
HCT: 42.3 % (ref 39.0–52.0)
Hemoglobin: 14.6 g/dL (ref 13.0–17.0)
Immature Granulocytes: 0 %
Lymphocytes Relative: 42 %
Lymphs Abs: 3.2 10*3/uL (ref 0.7–4.0)
MCH: 32.1 pg (ref 26.0–34.0)
MCHC: 34.5 g/dL (ref 30.0–36.0)
MCV: 93 fL (ref 80.0–100.0)
Monocytes Absolute: 1 10*3/uL (ref 0.1–1.0)
Monocytes Relative: 14 %
Neutro Abs: 3.1 10*3/uL (ref 1.7–7.7)
Neutrophils Relative %: 42 %
Platelets: 190 10*3/uL (ref 150–400)
RBC: 4.55 MIL/uL (ref 4.22–5.81)
RDW: 12.7 % (ref 11.5–15.5)
WBC: 7.4 10*3/uL (ref 4.0–10.5)
nRBC: 0 % (ref 0.0–0.2)

## 2022-04-11 LAB — BASIC METABOLIC PANEL
Anion gap: 7 (ref 5–15)
BUN: 12 mg/dL (ref 6–20)
CO2: 25 mmol/L (ref 22–32)
Calcium: 8.8 mg/dL — ABNORMAL LOW (ref 8.9–10.3)
Chloride: 103 mmol/L (ref 98–111)
Creatinine, Ser: 1.16 mg/dL (ref 0.61–1.24)
GFR, Estimated: >60 mL/min (ref >60)
Glucose, Bld: 155 mg/dL — ABNORMAL HIGH (ref 70–99)
Potassium: 3.3 mmol/L — ABNORMAL LOW (ref 3.5–5.1)
Sodium: 135 mmol/L (ref 135–145)

## 2022-04-11 MED ORDER — HYDROCODONE-ACETAMINOPHEN 5-325 MG PO TABS
1.0000 | ORAL_TABLET | Freq: Four times a day (QID) | ORAL | 0 refills | Status: DC | PRN
Start: 2022-04-11 — End: 2022-12-07

## 2022-04-11 MED ORDER — CEPHALEXIN 500 MG PO CAPS: 500.0000 mg | ORAL_CAPSULE | Freq: Three times a day (TID) | ORAL | 0 refills | Status: DC

## 2022-04-11 NOTE — Discharge Instructions (Signed)
Begin taking Keflex as prescribed.  Begin taking hydrocodone as prescribed as needed for pain.  Follow-up with your primary doctor/urologist if symptoms are not improving in the next few days.

## 2022-04-11 NOTE — ED Triage Notes (Signed)
Pt presents to ED with c/o right sided flank pain that woke him up about an hour ago, pt says he has recently had a kidney stone and pain feels the same. Pt also reports urgency and oliguria

## 2022-04-11 NOTE — ED Provider Notes (Signed)
[facility] EMERGENCY DEPARTMENT Provider Note   CSN: [id number] Arrival date & time: 04/11/22  0310     History  Chief Complaint  Patient presents with   Flank Pain    Gregory Andrews is a 61 y.o. male.  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  This was removed many years ago.  He presents today with complaints of right sided flank pain.  This woke him from sleep approximately 1 hour prior to presentation.  He also describes urgency to void and is urinating small amounts of bloody urine and debris he believes may be a passing kidney stone.  He had a similar presentation several months ago.  His CT scan showed what appeared to be passed stones in his urinary bladder, but no renal stones or obstructive uropathy.  Today's presentation feels similar to that prior episode.  The history is provided by the patient.       Home Medications Prior to Admission medications   Medication Sig Start Date End Date Taking? Authorizing Provider  apixaban (ELIQUIS) 5 MG TABS tablet Take 1 tablet (5 mg total) by mouth 2 (two) times daily. In place of Coumadin 04/07/22   Paseda, Dewaine Conger, FNP  atorvastatin (LIPITOR) 20 MG tablet Take 1 tablet (20 mg total) by mouth at bedtime. 04/07/22   Renee Rival, FNP  blood glucose meter kit and supplies Dispense  1 Relion Prime glucometer, 1 box of test strips, lancets, alcohol wipes and cotton balls. Use up to four times daily as directed. (FOR ICD-9 250.00, 250.01). 10/09/17   Roxan Hockey, MD  Blood Glucose Monitoring Suppl (RELION PRIME MONITOR) DEVI 1 Device by Does not apply route as directed. Glucometer 10/09/17   Roxan Hockey, MD  glimepiride (AMARYL) 2 MG tablet Take 1 tablet (2 mg total) by mouth 2 (two) times daily before a meal. Before Breakfast and Supper Patient not taking: Reported on  05/26/2020 10/09/17   Roxan Hockey, MD  glucose blood (RELION PRIME TEST) test strip Use as instructed to test blood sugar before meals and before bedtime 10/09/17   Roxan Hockey, MD  insulin glargine (LANTUS) 100 UNIT/ML injection Take 20 units every evening Patient not taking: Reported on 05/26/2020 10/09/17   Roxan Hockey, MD  lisinopril (ZESTRIL) 10 MG tablet Take 1 tablet (10 mg total) by mouth daily. 04/07/22   Renee Rival, FNP  metFORMIN (GLUCOPHAGE) 850 MG tablet Take 1 tablet (850 mg total) by mouth 2 (two) times daily with a meal. Breakfast and Supper Patient not taking: Reported on 05/26/2020 10/09/17 10/09/18  Roxan Hockey, MD  pantoprazole (PROTONIX) 20 MG tablet Take 1 tablet (20 mg total) by mouth daily. Patient not taking: Reported on 05/26/2020 04/29/20   Evalee Jefferson, PA-C      Allergies    Codeine    Review of Systems   Review of Systems  All other systems reviewed and are negative.   Physical Exam Updated Vital Signs BP (!) 149/95 (BP Location: Left Arm)   Pulse 88   Temp 98.4 F (36.9 C) (Oral)   Resp 18   Ht $R'5\' 10"'gR$  (1.778 m)   Wt 80.7 kg   SpO2 98%   BMI 25.54 kg/m  Physical Exam Vitals and nursing note reviewed.  Constitutional:      General: He is not in acute distress.    Appearance: He is well-developed.  He is not diaphoretic.  HENT:     Head: Normocephalic and atraumatic.  Cardiovascular:     Rate and Rhythm: Normal rate and regular rhythm.     Heart sounds: No murmur heard.    No friction rub.  Pulmonary:     Effort: Pulmonary effort is normal. No respiratory distress.     Breath sounds: Normal breath sounds. No wheezing or rales.  Abdominal:     General: Bowel sounds are normal. There is no distension.     Palpations: Abdomen is soft.     Tenderness: There is no abdominal tenderness. There is right CVA tenderness. There is no left CVA tenderness, guarding or rebound.  Musculoskeletal:        General: Normal range of motion.      Cervical back: Normal range of motion and neck supple.  Skin:    General: Skin is warm and dry.  Neurological:     Mental Status: He is alert and oriented to person, place, and time.     Coordination: Coordination normal.     ED Results / Procedures / Treatments   Labs (all labs ordered are listed, but only abnormal results are displayed) Labs Reviewed  URINALYSIS, ROUTINE W REFLEX MICROSCOPIC    EKG None  Radiology No results found.  Procedures Procedures    Medications Ordered in ED Medications - No data to display  ED Course/ Medical Decision Making/ A&P  This patient presents to the ED for concern of urinary frequency and right flank pain, this involves an extensive number of treatment options, and is a complaint that carries with it a high risk of complications and morbidity.  The differential diagnosis includes UTI, renal calculus   Co morbidities that complicate the patient evaluation  Prior left nephrectomy   Additional history obtained:  No additional history or external records needed   Lab Tests:  I Ordered, and personally interpreted labs.  The pertinent results include: Unremarkable CBC and metabolic panel.  Specifically, creatinine is consistent with baseline.   Imaging Studies ordered:  I ordered imaging studies including renal CT I independently visualized and interpreted imaging which showed some calcifications either representing passed stones or calcifications within the bladder wall I agree with the radiologist interpretation   Cardiac Monitoring: / EKG:  None performed   Consultations Obtained:  No consultations indicated   Problem List / ED Course / Critical interventions / Medication management  Patient presenting with flank pain as described in the HPI.  His urinalysis shows blood and he is having some gross hematuria.  CT findings suggestive of possibly passed stones or calcifications within the bladder wall.  There are  white cells in his urinalysis and will be treated with Keflex.  He will also be given medication for pain, but seems appropriate for discharge.  There is no signs of obstruction within his solitary kidney or collecting system and his creatinine is consistent with baseline. Keflex ordered and given here. I have reviewed the patients home medicines and have made adjustments as needed   Social Determinants of Health:  None   Test / Admission - Considered:  Patient to be discharged with Keflex and pain medicine.  He is to follow-up as needed if not improving.  Final Clinical Impression(s) / ED Diagnoses Final diagnoses:  None    Rx / DC Orders ED Discharge Orders     None         Veryl Speak, MD 04/11/22 [phone]

## 2022-05-24 DIAGNOSIS — Z6825 Body mass index (BMI) 25.0-25.9, adult: Secondary | ICD-10-CM | POA: Diagnosis not present

## 2022-05-24 DIAGNOSIS — Z7901 Long term (current) use of anticoagulants: Secondary | ICD-10-CM | POA: Diagnosis not present

## 2022-05-24 DIAGNOSIS — E1169 Type 2 diabetes mellitus with other specified complication: Secondary | ICD-10-CM | POA: Diagnosis not present

## 2022-05-24 DIAGNOSIS — I10 Essential (primary) hypertension: Secondary | ICD-10-CM | POA: Diagnosis not present

## 2022-05-24 DIAGNOSIS — E785 Hyperlipidemia, unspecified: Secondary | ICD-10-CM | POA: Diagnosis not present

## 2022-06-08 ENCOUNTER — Ambulatory Visit: Payer: 59 | Admitting: Internal Medicine

## 2022-12-07 ENCOUNTER — Emergency Department (HOSPITAL_COMMUNITY): Payer: BLUE CROSS/BLUE SHIELD

## 2022-12-07 ENCOUNTER — Other Ambulatory Visit: Payer: Self-pay

## 2022-12-07 ENCOUNTER — Observation Stay (HOSPITAL_COMMUNITY)
Admission: EM | Admit: 2022-12-07 | Discharge: 2022-12-09 | Disposition: A | Discharge status: Home / Self Care | Payer: BLUE CROSS/BLUE SHIELD | Attending: Internal Medicine | Admitting: Internal Medicine

## 2022-12-07 ENCOUNTER — Encounter (HOSPITAL_COMMUNITY): Payer: Self-pay | Admitting: *Deleted

## 2022-12-07 DIAGNOSIS — I10 Essential (primary) hypertension: Secondary | ICD-10-CM | POA: Diagnosis present

## 2022-12-07 DIAGNOSIS — Z7901 Long term (current) use of anticoagulants: Secondary | ICD-10-CM | POA: Diagnosis not present

## 2022-12-07 DIAGNOSIS — E1169 Type 2 diabetes mellitus with other specified complication: Secondary | ICD-10-CM | POA: Diagnosis not present

## 2022-12-07 DIAGNOSIS — E785 Hyperlipidemia, unspecified: Secondary | ICD-10-CM | POA: Diagnosis not present

## 2022-12-07 DIAGNOSIS — Z87891 Personal history of nicotine dependence: Secondary | ICD-10-CM | POA: Insufficient documentation

## 2022-12-07 DIAGNOSIS — I2699 Other pulmonary embolism without acute cor pulmonale: Principal | ICD-10-CM | POA: Diagnosis present

## 2022-12-07 DIAGNOSIS — R079 Chest pain, unspecified: Secondary | ICD-10-CM | POA: Diagnosis present

## 2022-12-07 DIAGNOSIS — K219 Gastro-esophageal reflux disease without esophagitis: Secondary | ICD-10-CM | POA: Diagnosis present

## 2022-12-07 DIAGNOSIS — I2782 Chronic pulmonary embolism: Secondary | ICD-10-CM

## 2022-12-07 DIAGNOSIS — Z86718 Personal history of other venous thrombosis and embolism: Secondary | ICD-10-CM | POA: Diagnosis not present

## 2022-12-07 DIAGNOSIS — R0602 Shortness of breath: Secondary | ICD-10-CM | POA: Insufficient documentation

## 2022-12-07 DIAGNOSIS — Z720 Tobacco use: Secondary | ICD-10-CM | POA: Diagnosis present

## 2022-12-07 LAB — BASIC METABOLIC PANEL
Anion gap: 8 (ref 5–15)
BUN: 12 mg/dL (ref 6–20)
CO2: 25 mmol/L (ref 22–32)
Calcium: 8.5 mg/dL — ABNORMAL LOW (ref 8.9–10.3)
Chloride: 107 mmol/L (ref 98–111)
Creatinine, Ser: 0.96 mg/dL (ref 0.61–1.24)
GFR, Estimated: >60 mL/min (ref >60)
Glucose, Bld: 113 mg/dL — ABNORMAL HIGH (ref 70–99)
Potassium: 4 mmol/L (ref 3.5–5.1)
Sodium: 140 mmol/L (ref 135–145)

## 2022-12-07 LAB — CBC
HCT: 44.6 % (ref 39.0–52.0)
Hemoglobin: 14.8 g/dL (ref 13.0–17.0)
MCH: 31.8 pg (ref 26.0–34.0)
MCHC: 33.2 g/dL (ref 30.0–36.0)
MCV: 95.7 fL (ref 80.0–100.0)
Platelets: 200 10*3/uL (ref 150–400)
RBC: 4.66 MIL/uL (ref 4.22–5.81)
RDW: 13 % (ref 11.5–15.5)
WBC: 4.9 10*3/uL (ref 4.0–10.5)
nRBC: 0 % (ref 0.0–0.2)

## 2022-12-07 LAB — TROPONIN I (HIGH SENSITIVITY)
Troponin I (High Sensitivity): 4 ng/L (ref <18)
Troponin I (High Sensitivity): 4 ng/L (ref <18)

## 2022-12-07 LAB — BRAIN NATRIURETIC PEPTIDE: B Natriuretic Peptide: 9 pg/mL (ref 0.0–100.0)

## 2022-12-07 MED ORDER — OXYCODONE HCL 5 MG PO TABS
5.0000 mg | ORAL_TABLET | ORAL | Status: DC | PRN
  Administered 2022-12-07 – 2022-12-09 (×9): 5 mg via ORAL
  Filled 2022-12-07 (×9): qty 1

## 2022-12-07 MED ORDER — ONDANSETRON HCL 4 MG PO TABS: 4.0000 mg | ORAL_TABLET | Freq: Four times a day (QID) | ORAL | Status: DC | PRN

## 2022-12-07 MED ORDER — HYDROCODONE-ACETAMINOPHEN 5-325 MG PO TABS
1.0000 | ORAL_TABLET | Freq: Once | ORAL | Status: AC
  Administered 2022-12-07: 1 via ORAL
  Filled 2022-12-07: qty 1

## 2022-12-07 MED ORDER — ACETAMINOPHEN 325 MG PO TABS: 650.0000 mg | ORAL_TABLET | Freq: Four times a day (QID) | ORAL | Status: DC | PRN

## 2022-12-07 MED ORDER — LIDOCAINE 5 % EX PTCH
1.0000 | MEDICATED_PATCH | CUTANEOUS | Status: DC
  Administered 2022-12-07 – 2022-12-08 (×2): 1 via TRANSDERMAL
  Filled 2022-12-07 (×5): qty 1

## 2022-12-07 MED ORDER — IOHEXOL 350 MG/ML SOLN
100.0000 mL | Freq: Once | INTRAVENOUS | Status: AC | PRN
  Administered 2022-12-07: 100 mL via INTRAVENOUS

## 2022-12-07 MED ORDER — FENTANYL CITRATE PF 50 MCG/ML IJ SOSY
50.0000 ug | PREFILLED_SYRINGE | Freq: Once | INTRAMUSCULAR | Status: AC
  Administered 2022-12-07: 50 ug via INTRAVENOUS
  Filled 2022-12-07: qty 1

## 2022-12-07 MED ORDER — HEPARIN BOLUS VIA INFUSION
4000.0000 [IU] | Freq: Once | INTRAVENOUS | Status: AC
  Administered 2022-12-07: 4000 [IU] via INTRAVENOUS

## 2022-12-07 MED ORDER — KETOROLAC TROMETHAMINE 15 MG/ML IJ SOLN
15.0000 mg | Freq: Once | INTRAMUSCULAR | Status: AC
  Administered 2022-12-07: 15 mg via INTRAVENOUS
  Filled 2022-12-07: qty 1

## 2022-12-07 MED ORDER — INSULIN ASPART 100 UNIT/ML IJ SOLN: 0.0000 [IU] | Freq: Three times a day (TID) | INTRAMUSCULAR | Status: DC

## 2022-12-07 MED ORDER — HEPARIN (PORCINE) 25000 UT/250ML-% IV SOLN
1350.0000 [IU]/h | INTRAVENOUS | Status: DC
  Administered 2022-12-07: 1500 [IU]/h via INTRAVENOUS
  Administered 2022-12-09: 1350 [IU]/h via INTRAVENOUS
  Filled 2022-12-07 (×3): qty 250

## 2022-12-07 MED ORDER — ONDANSETRON HCL 4 MG/2ML IJ SOLN: 4.0000 mg | Freq: Four times a day (QID) | INTRAMUSCULAR | Status: DC | PRN

## 2022-12-07 MED ORDER — ACETAMINOPHEN 650 MG RE SUPP: 650.0000 mg | Freq: Four times a day (QID) | RECTAL | Status: DC | PRN

## 2022-12-07 NOTE — ED Notes (Signed)
Patient saturation increased after waking patient up at this time.

## 2022-12-07 NOTE — ED Provider Notes (Signed)
[facility] Provider Note   CSN: [id number] Arrival date & time: 12/07/22  1135     History  Chief Complaint  Patient presents with   Chest Pain    Gregory Andrews is a 62 y.o. 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  He states that it came on acutely last night, and has been persistent since then, pain is a 10 out of 10 taking deep breath, and associated with nausea.  Denies any abdominal pain, vomiting, or changes in stools.  Has not had any recent illnesses.  Pain is sharp and stabbing in nature.    Home Medications Prior to Admission medications   Medication Sig Start Date End Date Taking? Authorizing Provider  apixaban (ELIQUIS) 5 MG TABS tablet Take 1 tablet (5 mg total) by mouth 2 (two) times daily. In place of Coumadin 04/07/22  Yes Paseda, Dewaine Conger, FNP  blood glucose meter kit and supplies Dispense  1 Relion Prime glucometer, 1 box of test strips, lancets, alcohol wipes and cotton balls. Use up to four times daily as directed. (FOR ICD-9 250.00, 250.01). 10/09/17   Roxan Hockey, MD  Blood Glucose Monitoring Suppl (RELION PRIME MONITOR) DEVI 1 Device by Does not apply route as directed. Glucometer 10/09/17   Roxan Hockey, MD  glucose blood (RELION PRIME TEST) test strip Use as instructed to test blood sugar before meals and before bedtime 10/09/17   Roxan Hockey, MD      Allergies    Codeine    Review of Systems   Review of Systems  Respiratory:  Positive for shortness of breath. Negative for cough.   Cardiovascular:  Positive for chest pain.  Gastrointestinal:  Positive for nausea. Negative for abdominal pain and diarrhea.    Physical Exam Updated Vital Signs BP 137/86   Pulse 65   Temp 98.1 F (36.7 C) (Oral)   Resp 15   Ht 5\' 10"  (1.778 m)   Wt 88.5 kg   SpO2  92%   BMI 27.98 kg/m  Physical Exam Vitals and nursing note reviewed.  Constitutional:      General: He is not in acute distress.    Appearance: He is well-developed.  HENT:     Head: Normocephalic and atraumatic.  Eyes:     Conjunctiva/sclera: Conjunctivae normal.  Cardiovascular:     Rate and Rhythm: Normal rate and regular rhythm.     Heart sounds: No murmur heard. Pulmonary:     Effort: Pulmonary effort is normal. No respiratory distress.     Breath sounds: Normal breath sounds.  Chest:     Chest wall: Tenderness present.     Comments: TTP of left lateral chest Abdominal:     Palpations: Abdomen is soft.     Tenderness: There is no abdominal tenderness.  Musculoskeletal:        General: No swelling.     Cervical back: Neck supple.  Skin:    General: Skin is warm and dry.     Capillary Refill: Capillary refill takes less than 2 seconds.     Comments: No rash  Neurological:     Mental Status: He is alert.  Psychiatric:        Mood and Affect: Mood normal.     ED Results / Procedures / Treatments   Labs (all labs  ordered are listed, but only abnormal results are displayed) Labs Reviewed  BASIC METABOLIC PANEL - Abnormal; Notable for the following components:      Result Value   Glucose, Bld 113 (*)    Calcium 8.5 (*)    All other components within normal limits  CBC  BRAIN NATRIURETIC PEPTIDE  HEPARIN LEVEL (UNFRACTIONATED)  CBC  APTT  APTT  HEPARIN LEVEL (UNFRACTIONATED)  TROPONIN I (HIGH SENSITIVITY)  TROPONIN I (HIGH SENSITIVITY)    EKG EKG Interpretation  Date/Time:  Tuesday December 07 2022 11:51:07 EDT Ventricular Rate:  93 PR Interval:  134 QRS Duration: 126 QT Interval:  362 QTC Calculation: 450 R Axis:   103 Text Interpretation: Normal sinus rhythm Right bundle branch block Confirmed by Godfrey Pick ([id number]) on 12/07/2022 1:07:13 PM  Radiology CT Angio Chest PE W and/or Wo Contrast  Result Date: 12/07/2022 CLINICAL DATA:  Shortness of breath  EXAM: CT ANGIOGRAPHY CHEST CT ABDOMEN AND PELVIS WITH CONTRAST TECHNIQUE: Multidetector CT imaging of the chest was performed using the standard protocol during bolus administration of intravenous contrast. Multiplanar CT image reconstructions and MIPs were obtained to evaluate the vascular anatomy. Multidetector CT imaging of the abdomen and pelvis was performed using the standard protocol during bolus administration of intravenous contrast. RADIATION DOSE REDUCTION: This exam was performed according to the departmental dose-optimization program which includes automated exposure control, adjustment of the mA and/or kV according to patient size and/or use of iterative reconstruction technique. CONTRAST:  117mL OMNIPAQUE IOHEXOL 350 MG/ML SOLN COMPARISON:  Chest CTA dated November 01, 2012 FINDINGS: CTA CHEST FINDINGS Cardiovascular: Filling defects of a subsegmental right lower lobe pulmonary artery. Linear opacity of the right lower lobe lobar artery at site of previously seen pulmonary embolus, consistent with chronic post thrombotic change. Normal heart size. No evidence of right heart strain. No pericardial effusion. Normal caliber thoracic aorta with no atherosclerotic disease. Mediastinum/Nodes: Esophagus and thyroid are unremarkable. No pathologically enlarged lymph nodes seen in the chest. Lungs/Pleura: Central airways are patent. Elevation of the left hemidiaphragm. Bibasilar atelectasis. No consolidation, pleural effusion or pneumothorax. Musculoskeletal: No chest wall abnormality. No acute or significant osseous findings. Review of the MIP images confirms the above findings. CT ABDOMEN and PELVIS FINDINGS Hepatobiliary: No focal liver abnormality is seen. No gallstones, gallbladder wall thickening, or biliary dilatation. Pancreas: Unremarkable. No pancreatic ductal dilatation or surrounding inflammatory changes. Spleen: Bilateral adrenal glands are unremarkable. Adrenals/Urinary Tract: Surgically absent  left kidney. Normal appearance of the right kidney. Layering hyperdensity seen in the bladder, likely excreted contrast. Stomach/Bowel: Stomach is within normal limits. Appendix appears normal. No evidence of bowel wall thickening, distention, or inflammatory changes. Vascular/Lymphatic: Aortic atherosclerosis. No enlarged abdominal or pelvic lymph nodes. Reproductive: Prostatomegaly, measuring up to 5.7 cm. Other: No abdominal wall hernia or abnormality. No abdominopelvic ascites. Musculoskeletal: No acute or significant osseous findings. IMPRESSION: 1. Findings consistent with acute on chronic pulmonary embolus. Tylicia Sherman acute appearing subsegmental pulmonary embolus of the right lower lobe. Additional linear filling defect of the right lobar pulmonary artery which is likely chronic post thrombotic change. 2. Marked elevation of the left hemidiaphragm and bibasilar atelectasis. 3. No acute findings in the abdomen or pelvis. Critical Value/emergent results were called by telephone at the time of interpretation on 12/07/2022 at 5:04 pm to provider Dr. Roderic Palau, who verbally acknowledged these results. Electronically Signed   By: Yetta Glassman M.D.   On: 12/07/2022 17:06   CT ABDOMEN PELVIS W CONTRAST  Result Date: 12/07/2022 CLINICAL DATA:  Shortness of breath EXAM: CT ANGIOGRAPHY CHEST CT ABDOMEN AND PELVIS WITH CONTRAST TECHNIQUE: Multidetector CT imaging of the chest was performed using the standard protocol during bolus administration of intravenous contrast. Multiplanar CT image reconstructions and MIPs were obtained to evaluate the vascular anatomy. Multidetector CT imaging of the abdomen and pelvis was performed using the standard protocol during bolus administration of intravenous contrast. RADIATION DOSE REDUCTION: This exam was performed according to the departmental dose-optimization program which includes automated exposure control, adjustment of the mA and/or kV according to patient size and/or use of  iterative reconstruction technique. CONTRAST:  179mL OMNIPAQUE IOHEXOL 350 MG/ML SOLN COMPARISON:  Chest CTA dated November 01, 2012 FINDINGS: CTA CHEST FINDINGS Cardiovascular: Filling defects of a subsegmental right lower lobe pulmonary artery. Linear opacity of the right lower lobe lobar artery at site of previously seen pulmonary embolus, consistent with chronic post thrombotic change. Normal heart size. No evidence of right heart strain. No pericardial effusion. Normal caliber thoracic aorta with no atherosclerotic disease. Mediastinum/Nodes: Esophagus and thyroid are unremarkable. No pathologically enlarged lymph nodes seen in the chest. Lungs/Pleura: Central airways are patent. Elevation of the left hemidiaphragm. Bibasilar atelectasis. No consolidation, pleural effusion or pneumothorax. Musculoskeletal: No chest wall abnormality. No acute or significant osseous findings. Review of the MIP images confirms the above findings. CT ABDOMEN and PELVIS FINDINGS Hepatobiliary: No focal liver abnormality is seen. No gallstones, gallbladder wall thickening, or biliary dilatation. Pancreas: Unremarkable. No pancreatic ductal dilatation or surrounding inflammatory changes. Spleen: Bilateral adrenal glands are unremarkable. Adrenals/Urinary Tract: Surgically absent left kidney. Normal appearance of the right kidney. Layering hyperdensity seen in the bladder, likely excreted contrast. Stomach/Bowel: Stomach is within normal limits. Appendix appears normal. No evidence of bowel wall thickening, distention, or inflammatory changes. Vascular/Lymphatic: Aortic atherosclerosis. No enlarged abdominal or pelvic lymph nodes. Reproductive: Prostatomegaly, measuring up to 5.7 cm. Other: No abdominal wall hernia or abnormality. No abdominopelvic ascites. Musculoskeletal: No acute or significant osseous findings. IMPRESSION: 1. Findings consistent with acute on chronic pulmonary embolus. Norelle Runnion acute appearing subsegmental pulmonary  embolus of the right lower lobe. Additional linear filling defect of the right lobar pulmonary artery which is likely chronic post thrombotic change. 2. Marked elevation of the left hemidiaphragm and bibasilar atelectasis. 3. No acute findings in the abdomen or pelvis. Critical Value/emergent results were called by telephone at the time of interpretation on 12/07/2022 at 5:04 pm to provider Dr. Roderic Palau, who verbally acknowledged these results. Electronically Signed   By: Yetta Glassman M.D.   On: 12/07/2022 17:06   DG Chest 2 View  Result Date: 12/07/2022 CLINICAL DATA:  Chest pain. EXAM: CHEST - 2 VIEW COMPARISON:  Chest x-ray October 31, 2012. FINDINGS: Elevated left hemidiaphragm. No consolidation. No visible pleural effusions or pneumothorax. Cardiomediastinal silhouette is unchanged. IMPRESSION: No active cardiopulmonary disease. Electronically Signed   By: Margaretha Sheffield M.D.   On: 12/07/2022 12:28    Procedures Procedures    Medications Ordered in ED Medications  lidocaine (LIDODERM) 5 % 1 patch (1 patch Transdermal Patch Applied 12/07/22 1347)  heparin bolus via infusion 4,000 Units (has no administration in time range)  heparin ADULT infusion 100 units/mL (25000 units/262mL) (has no administration in time range)  fentaNYL (SUBLIMAZE) injection 50 mcg (50 mcg Intravenous Given 12/07/22 1347)  iohexol (OMNIPAQUE) 350 MG/ML injection 100 mL (100 mLs Intravenous Contrast Given 12/07/22 1602)  HYDROcodone-acetaminophen (NORCO/VICODIN) 5-325 MG per tablet 1 tablet (1 tablet Oral Given 12/07/22 1636)  ketorolac (TORADOL) 15 MG/ML injection 15 mg (  15 mg Intravenous Given 12/07/22 1636)    ED Course/ Medical Decision Making/ A&P                             Medical Decision Making Patient is a 62 year old male, here for left chest discomfort, this been going on for the last day.  Associated with shortness of breath, worse when deep breaths.  10 out of 10.  Feels like last PE, has been  compliant with Eliquis.  Has had history of 2 PEs, first was caused by chemical that he was making and plan he worked out, second was because he was off his Eliquis.  He has been compliant with his Eliquis.  Will obtain a CTA chest to evaluate for possible PE, additionally troponins, blood work for further evaluation.  Amount and/or Complexity of Data Reviewed Labs: ordered.    Details: Troponins within normal limits Radiology: ordered.    Details: CTA shows acute on chronic PE Discussion of management or test interpretation with external provider(s): Discussed with patient, found to have acute on chronic PE, compliant with Eliquis, he will likely need to be started on a heparin drip, and admitted to the hospital, with hematology oncology consultation.  He has failed his blood thinner, thus will need to be reviewed. Admitted to Dr. Clearence Ped for heparin and hem/onc consult. Not requiring O2, non tachycardic  Risk Prescription drug management. Decision regarding hospitalization.   Final Clinical Impression(s) / ED Diagnoses Final diagnoses:  Acute pulmonary embolism, unspecified pulmonary embolism type, unspecified whether acute cor pulmonale present ([place])  Chronic pulmonary embolism, unspecified pulmonary embolism type, unspecified whether acute cor pulmonale present [facility])    Rx / DC Orders ED Discharge Orders     None         Toa Mia, Si Gaul, PA 12/07/22 1742    Godfrey Pick, MD 12/07/22 1753

## 2022-12-07 NOTE — Assessment & Plan Note (Signed)
-   Currently diet controlled - Continue to monitor

## 2022-12-07 NOTE — Assessment & Plan Note (Signed)
-   Down to 3 cigarettes/day - Encourage continued work towards cessation - Advised no need for nicotine patch at this low-dose of cigarettes per day

## 2022-12-07 NOTE — ED Triage Notes (Signed)
Pt with left sided chest pain since yesterday, woke up with bilateral feet and ankle swelling today. Pt states with deep breath, pain worse.  Pt with hx of PE, currently on Eliquis.

## 2022-12-07 NOTE — ED Notes (Signed)
Pt just returned from ct. Nad. Ambulated to bathroom without difficulty

## 2022-12-07 NOTE — ED Notes (Signed)
Gave pt sandwich bag

## 2022-12-07 NOTE — H&P (Signed)
History and Physical    Patient: Gregory Andrews [id number] DOB: October 18, 1960 DOA: 12/07/2022 DOS: the patient was seen and examined on 12/07/2022 PCP: Iona Beard, MD  Patient coming from: Home  Chief Complaint:  Chief Complaint  Patient presents with   Chest Pain   HPI: SORREN PACIS is a 62 y.o. male with medical history significant of diabetes mellitus type 2, hyperlipidemia, hypertension, PE/DVT, and more presents the ED with a chief complaint of pain in the left chest and peripheral edema.  Patient reports he had bilateral, 8 equal peripheral edema.  He reports he noticed it this a.m.  The pain in his left chest started yesterday.  He describes it as pressure and heavy.  He reports the pain has been constant.  He has been taking extra strength Tylenol at home without relief.  He had a cough productive of clear sputum.  He has felt short of breath especially with exertion. No wheezing, no fever.  He has been on Eliquis for years.  In the past he had a PE and was on Eliquis, but it was considered provoked PE and he was taken off Eliquis after.  He had another PE at that time and was put back on Eliquis permanently.  He reports he had no bleeds on Eliquis.  He reports no missed doses.  He came in today because leg swelling and pain in his chest felt like his previous PEs.  He reports that his legs are also painful but mildly so.  He has no calf tenderness on palpation.  He reports that the legs are symmetric.  Patient has no other complaints at this time.  Patient does smoke about 3 cigarettes/day.  He does not drink alcohol, does not use illicit drugs.  He is vaccinated for flu and COVID.  Patient is full code. Review of Systems: As mentioned in the history of present illness. All other systems reviewed and are negative. Past Medical History:  Diagnosis Date   BPH (benign prostatic hyperplasia)    Diabetes mellitus without complication ([place])    Hyperlipidemia    Hypertension     Inguinal hernia    Noncompliance 01/21/2012   Missed appointments and lab appointments   Pulmonary embolism ([place])    Recurrent DVT (deep venous thrombosis) 01/21/2012   On life-long Coumadin   Recurrent pulmonary embolism 01/21/2012   On life-long Coumadin   Shoulder separation    left   Past Surgical History:  Procedure Laterality Date   INGUINAL HERNIA REPAIR Left 05/26/2020   Procedure: LEFT INGUINAL HERNIORRHAPY WITH MESH;  Surgeon: Aviva Signs, MD;  Location: [facility];  Service: General;  Laterality: Left;   left kidney removed     right knee surgery     UMBILICAL HERNIA REPAIR  05/26/2020   Procedure: UMBILICAL HERNIORRHAPY WITH MESH;  Surgeon: Aviva Signs, MD;  Location: [facility];  Service: General;;   Social History:  reports that he quit smoking about 2 years ago. His smoking use included cigarettes. He quit smokeless tobacco use about 10 years ago. He reports that he does not currently use alcohol. He reports that he does not use drugs.  Allergies  Allergen Reactions   Codeine Shortness Of Breath    Family History  Problem Relation Age of Onset   Breast cancer Mother    Memory loss Father    Brain cancer Sister    Anesthesia problems Neg Hx    Prostate cancer Neg Hx    Cancer - Colon  Neg Hx     Prior to Admission medications   Medication Sig Start Date End Date Taking? Authorizing Provider  apixaban (ELIQUIS) 5 MG TABS tablet Take 1 tablet (5 mg total) by mouth 2 (two) times daily. In place of Coumadin 04/07/22  Yes Paseda, Dewaine Conger, FNP  blood glucose meter kit and supplies Dispense  1 Relion Prime glucometer, 1 box of test strips, lancets, alcohol wipes and cotton balls. Use up to four times daily as directed. (FOR ICD-9 250.00, 250.01). 10/09/17   Roxan Hockey, MD  Blood Glucose Monitoring Suppl (RELION PRIME MONITOR) DEVI 1 Device by Does not apply route as directed. Glucometer 10/09/17   Roxan Hockey, MD  glucose blood (RELION PRIME TEST) test strip  Use as instructed to test blood sugar before meals and before bedtime 10/09/17   Roxan Hockey, MD    Physical Exam: Vitals:   12/07/22 1900 12/07/22 1930 12/07/22 2000 12/07/22 2030  BP: (!) 145/94 (!) 142/99 (!) 146/92 (!) 142/93  Pulse: 89 69 68 70  Resp: 20 15 16  (!) 23  Temp:      TempSrc:      SpO2: 94% 95% 94% 95%  Weight:      Height:       1.  General: Patient lying supine in bed,  no acute distress   2. Psychiatric: Alert and oriented x 3, mood and behavior normal for situation, pleasant and cooperative with exam   3. Neurologic: Speech and language are normal, face is symmetric, moves all 4 extremities voluntarily, at baseline without acute deficits on limited exam   4. HEENMT:  Head is atraumatic, normocephalic, pupils reactive to light, neck is supple, trachea is midline, mucous membranes are moist   5. Respiratory : Lungs are clear to auscultation bilaterally without wheezing, rhonchi, rales, no cyanosis, no increase in work of breathing or accessory muscle use   6. Cardiovascular : Heart rate normal, rhythm is regular, no murmurs, rubs or gallops, positive for peripheral edema, peripheral pulses palpated   7. Gastrointestinal:  Abdomen is soft, nondistended, nontender to palpation bowel sounds active, no masses or organomegaly palpated   8. Skin:  Skin is warm, dry and intact without rashes, acute lesions, or ulcers on limited exam   9.Musculoskeletal:  No acute deformities or trauma, no asymmetry in tone, positive for peripheral edema, peripheral pulses palpated, no tenderness to palpation in the extremities  Data Reviewed: In the ED Temp 98.1, heart rate 65-100, respiratory 12-16, blood pressure 124/79-104/94, satting 90-99% on room air No leukocytosis with white blood cell count of 4.9, hemoglobin 14.8, platelets 200 Chemistry unremarkable Trope 4, 4 BNP 9.0 CT chest abdomen pelvis shows acute on chronic and acute PE on the right Patient was given  fentanyl, Norco, Toradol, Lidoderm in the ED Heparin per pharmacy consult ordered in the ED Admission requested for acute on chronic pulmonary embolism  Assessment and Plan: * Acute pulmonary embolism ([place]) - CT scan shows acute on chronic pulmonary embolus.  Small acute appearing subsegmental pulmonary embolus of the right lower lobe.  Additional linear filling defects of the right lobar pulmonary artery likely chronic postthrombotic change - Patient has now failed Eliquis as he reports no missed doses - Hematology consult, patient is reluctant to start warfarin-other options? - Echo in the a.m. - Ultrasound bilateral lower extremities to assess clot burden - Heparin per pharmacy consult - Continue to monitor  Type 2 diabetes mellitus with other specified complication ([place]) - Monitor CBGs - Sliding  scale coverage  Essential hypertension - Currently diet controlled - Continue to monitor  Tobacco abuse - Down to 3 cigarettes/day - Encourage continued work towards cessation - Advised no need for nicotine patch at this low-dose of cigarettes per day  Chronic anticoagulation for Recurrent DVT/PE - No missed doses of Eliquis - Eliquis failed, currently on heparin        Advance Care Planning:   Code Status: Full Code  Consults: Hematology  Family Communication: No family at bedside  Severity of Illness: The appropriate patient status for this patient is INPATIENT. Inpatient status is judged to be reasonable and necessary in order to provide the required intensity of service to ensure the patient's safety. The patient's presenting symptoms, physical exam findings, and initial radiographic and laboratory data in the context of their chronic comorbidities is felt to place them at high risk for further clinical deterioration. Furthermore, it is not anticipated that the patient will be medically stable for discharge from the hospital within 2 midnights of admission.   * I certify  that at the point of admission it is my clinical judgment that the patient will require inpatient hospital care spanning beyond 2 midnights from the point of admission due to high intensity of service, high risk for further deterioration and high frequency of surveillance required.*  Author: Rolla Plate, DO 12/07/2022 9:11 PM  For on call review [email or web address].

## 2022-12-07 NOTE — ED Provider Triage Note (Signed)
Emergency Medicine Provider Triage Evaluation Note  Gregory Andrews , a 62 y.o. male  was evaluated in triage.  Pt complains of left-sided lower chest pain beginning yesterday.  Notes swelling to both lower extremities this morning.  History of PEs and DVTs, chronically anticoagulated on Eliquis.  Chest pain worse with inspiration and movement.  He denies any shortness of breath, abdominal pain, nausea vomiting or fever.  No known injury.  Review of Systems  Positive: Left chest pain, swelling bilateral lower extremities Negative: Shortness of breath, fever, abdominal pain, vomiting  Physical Exam  BP (!) 130/94 (BP Location: Right Arm)   Pulse 100   Temp 98.1 F (36.7 C) (Oral)   Resp 15   Ht 5\' 10"  (1.778 m)   Wt 88.5 kg   SpO2 97%   BMI 27.98 kg/m  Gen:   Awake, no distress   Resp:  Normal effort  MSK:   Moves extremities without difficulty  Other:  Edema bilateral lower extremities  Medical Decision Making  Medically screening exam initiated at 12:30 PM.  Appropriate orders placed.  Gregory Andrews was informed that the remainder of the evaluation will be completed by another provider, this initial triage assessment does not replace that evaluation, and the importance of remaining in the ED until their evaluation is complete.     Kem Parkinson, PA-C 12/07/22 1232

## 2022-12-07 NOTE — Progress Notes (Signed)
ANTICOAGULATION CONSULT NOTE - Initial Consult  Pharmacy Consult for heparin gtt Indication: pulmonary embolus  Allergies  Allergen Reactions   Codeine Shortness Of Breath    Patient Measurements: Height: 5\' 10"  (177.8 cm) Weight: 88.5 kg (195 lb) IBW/kg (Calculated) : 73 Heparin Dosing Weight: 88.5 kg  Vital Signs: Temp: 98.1 F (36.7 C) (03/26 1146) Temp Source: Oral (03/26 1146) BP: 137/86 (03/26 1700) Pulse Rate: 65 (03/26 1700)  Labs: Recent Labs    12/07/22 1251 12/07/22 1447  HGB 14.8  --   HCT 44.6  --   PLT 200  --   CREATININE 0.96  --   TROPONINIHS 4 4    Estimated Creatinine Clearance: 91.7 mL/min (by C-G formula based on SCr of 0.96 mg/dL).   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 He has a history of recurrent DVT's and PE. CTA shows an acute on chronic PE. He has been taking apixaban PTA, states he is compliant with a last known dose on 12/07/2022 ~07:00.  A heparin consult is placed.  Goal of Therapy:  Heparin level 0.3-0.7 units/ml aPTT 66-102 seconds Monitor platelets by anticoagulation protocol: Yes   Plan:  Give 4000 units bolus x 1 Start heparin infusion at 1500 units/hr Check HL and aPTT in 8 hours and daily while on heparin Discontinue aPTT once HL / aPTT correlate Continue to monitor H&H and platelets  Henley Boettner BS, PharmD, BCPS Clinical  Pharmacist 12/07/2022 5:29 PM  Contact: [phone] after 3 PM  "Be curious, not judgmental..." -Jamal Maes

## 2022-12-07 NOTE — Assessment & Plan Note (Signed)
-   Monitor CBGs - Sliding scale coverage

## 2022-12-07 NOTE — Assessment & Plan Note (Signed)
-   No missed doses of Eliquis - Eliquis failed, currently on heparin

## 2022-12-07 NOTE — Assessment & Plan Note (Signed)
-   CT scan shows acute on chronic pulmonary embolus.  Small acute appearing subsegmental pulmonary embolus of the right lower lobe.  Additional linear filling defects of the right lobar pulmonary artery likely chronic postthrombotic change - Patient has now failed Eliquis as he reports no missed doses - Hematology consult, patient is reluctant to start warfarin-other options? - Echo in the a.m. - Ultrasound bilateral lower extremities to assess clot burden - Heparin per pharmacy consult - Continue to monitor

## 2022-12-08 ENCOUNTER — Inpatient Hospital Stay (HOSPITAL_COMMUNITY): Payer: BLUE CROSS/BLUE SHIELD

## 2022-12-08 ENCOUNTER — Inpatient Hospital Stay (HOSPITAL_BASED_OUTPATIENT_CLINIC_OR_DEPARTMENT_OTHER): Payer: BLUE CROSS/BLUE SHIELD

## 2022-12-08 DIAGNOSIS — I2699 Other pulmonary embolism without acute cor pulmonale: Secondary | ICD-10-CM | POA: Diagnosis not present

## 2022-12-08 DIAGNOSIS — I2609 Other pulmonary embolism with acute cor pulmonale: Secondary | ICD-10-CM | POA: Diagnosis not present

## 2022-12-08 LAB — COMPREHENSIVE METABOLIC PANEL
ALT: 22 U/L (ref 0–44)
AST: 22 U/L (ref 15–41)
Albumin: 3.4 g/dL — ABNORMAL LOW (ref 3.5–5.0)
Alkaline Phosphatase: 48 U/L (ref 38–126)
Anion gap: 7 (ref 5–15)
BUN: 13 mg/dL (ref 6–20)
CO2: 26 mmol/L (ref 22–32)
Calcium: 8.5 mg/dL — ABNORMAL LOW (ref 8.9–10.3)
Chloride: 104 mmol/L (ref 98–111)
Creatinine, Ser: 1.12 mg/dL (ref 0.61–1.24)
GFR, Estimated: >60 mL/min (ref >60)
Glucose, Bld: 110 mg/dL — ABNORMAL HIGH (ref 70–99)
Potassium: 4.1 mmol/L (ref 3.5–5.1)
Sodium: 137 mmol/L (ref 135–145)
Total Bilirubin: 1 mg/dL (ref 0.3–1.2)
Total Protein: 6.3 g/dL — ABNORMAL LOW (ref 6.5–8.1)

## 2022-12-08 LAB — APTT
aPTT: 126 seconds — ABNORMAL HIGH (ref 24–36)
aPTT: 98 seconds — ABNORMAL HIGH (ref 24–36)
aPTT: 93 seconds — ABNORMAL HIGH (ref 24–36)

## 2022-12-08 LAB — HEPARIN LEVEL (UNFRACTIONATED)
Heparin Unfractionated: >1.1 IU/mL — ABNORMAL HIGH (ref 0.30–0.70)
Heparin Unfractionated: 1.08 IU/mL — ABNORMAL HIGH (ref 0.30–0.70)
Heparin Unfractionated: 0.72 IU/mL — ABNORMAL HIGH (ref 0.30–0.70)

## 2022-12-08 LAB — ECHOCARDIOGRAM COMPLETE
Area-P 1/2: 2.37 cm2
Height: 70 in
S' Lateral: 3.5 cm
Weight: 2938.29 oz

## 2022-12-08 LAB — CBC WITH DIFFERENTIAL/PLATELET
Abs Immature Granulocytes: 0 10*3/uL (ref 0.00–0.07)
Basophils Absolute: 0.1 10*3/uL (ref 0.0–0.1)
Basophils Relative: 1 %
Eosinophils Absolute: 0.1 10*3/uL (ref 0.0–0.5)
Eosinophils Relative: 1 %
HCT: 39.8 % (ref 39.0–52.0)
Hemoglobin: 13.2 g/dL (ref 13.0–17.0)
Immature Granulocytes: 0 %
Lymphocytes Relative: 49 %
Lymphs Abs: 3.2 10*3/uL (ref 0.7–4.0)
MCH: 32 pg (ref 26.0–34.0)
MCHC: 33.2 g/dL (ref 30.0–36.0)
MCV: 96.6 fL (ref 80.0–100.0)
Monocytes Absolute: 0.9 10*3/uL (ref 0.1–1.0)
Monocytes Relative: 13 %
Neutro Abs: 2.3 10*3/uL (ref 1.7–7.7)
Neutrophils Relative %: 36 %
Platelets: 188 10*3/uL (ref 150–400)
RBC: 4.12 MIL/uL — ABNORMAL LOW (ref 4.22–5.81)
RDW: 12.9 % (ref 11.5–15.5)
WBC: 6.5 10*3/uL (ref 4.0–10.5)
nRBC: 0 % (ref 0.0–0.2)

## 2022-12-08 LAB — MAGNESIUM: Magnesium: 1.7 mg/dL (ref 1.7–2.4)

## 2022-12-08 LAB — GLUCOSE, CAPILLARY
Glucose-Capillary: 116 mg/dL — ABNORMAL HIGH (ref 70–99)
Glucose-Capillary: 133 mg/dL — ABNORMAL HIGH (ref 70–99)
Glucose-Capillary: 129 mg/dL — ABNORMAL HIGH (ref 70–99)
Glucose-Capillary: 139 mg/dL — ABNORMAL HIGH (ref 70–99)
Glucose-Capillary: 134 mg/dL — ABNORMAL HIGH (ref 70–99)

## 2022-12-08 LAB — HIV ANTIBODY (ROUTINE TESTING W REFLEX): HIV Screen 4th Generation wRfx: NONREACTIVE

## 2022-12-08 MED ORDER — CHLORHEXIDINE GLUCONATE CLOTH 2 % EX PADS
6.0000 | MEDICATED_PAD | Freq: Every day | CUTANEOUS | Status: DC
  Administered 2022-12-08 – 2022-12-09 (×2): 6 via TOPICAL

## 2022-12-08 NOTE — Progress Notes (Signed)
[place] for heparin  Indication: pulmonary embolus  Allergies  Allergen Reactions   Codeine Shortness Of Breath    Patient Measurements: Height: 5\' 10"  (177.8 cm) Weight: 83.3 kg (183 lb 10.3 oz) IBW/kg (Calculated) : 73 Heparin Dosing Weight: 88.5 kg  Vital Signs: Temp: 98.4 F (36.9 C) (03/27 0800) Temp Source: Oral (03/27 0800) BP: 151/98 (03/27 0900) Pulse Rate: 69 (03/27 0900)  Labs: Recent Labs    12/07/22 1251 12/07/22 1447 12/08/22 0313 12/08/22 1148  HGB 14.8  --  13.2  --   HCT 44.6  --  39.8  --   PLT 200  --  188  --   APTT  --   --  126* 98*  HEPARINUNFRC  --   --  >1.10* 1.08*  CREATININE 0.96  --  1.12  --   TROPONINIHS 4 4  --   --      Estimated Creatinine Clearance: 72.4 mL/min (by C-G formula based on SCr of 1.12 mg/dL).   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 In place of Coumadin 180 tablet 1 12/07/2022 at 0700   blood glucose meter kit and supplies Dispense  1 Relion Prime glucometer, 1 box of test strips, lancets, alcohol wipes and cotton balls. Use up to four times daily as directed. (FOR ICD-9 250.00, 250.01). 1 each 1    Blood Glucose Monitoring Suppl (RELION PRIME MONITOR) DEVI 1 Device by Does not apply route as directed. Glucometer 1 Device 1    glucose blood (RELION PRIME TEST) test  strip Use as instructed to test blood sugar before meals and before bedtime 100 each 3    Scheduled:   Chlorhexidine Gluconate Cloth  6 each Topical Q0600   insulin aspart  0-15 Units Subcutaneous TID WC   lidocaine  1 patch Transdermal Q24H    Assessment: [name] admitted with cc of chest pain. He has a history of recurrent DVT's and PE. CTA shows an acute on chronic PE. He has been taking apixaban PTA, states he is compliant with a last known dose on 12/07/2022 ~07:00.  Pharmacy consulted to dose heparin.    aPTT 98- therapeutic   Goal of Therapy:  Heparin level 0.3-0.7 units/ml aPTT 66-102 seconds Monitor platelets by anticoagulation protocol: Yes   Plan:  Continue heparin infusion at 1350 units/hr Confirmatory Heparin level in 6 hours and daily.  Margot Ables, PharmD Clinical Pharmacist 12/08/2022 12:26 PM

## 2022-12-08 NOTE — Progress Notes (Signed)
[facility] PROGRESS NOTE   Gregory Andrews [id number] DOB: 1960-11-10 DOA: 12/07/2022  PCP: [place]ona Beard, MD  Brief History/Interval Summary: 62 y.o. male with medical history significant of diabetes mellitus type 2, hyperlipidemia, hypertension, PE/DVT, presented with left-sided chest pain.  He was found to have acute pulmonary embolism.  Initially gave the impression that he was compliant with his Eliquis.  But today he mentioned that he was taking it only once a day.    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  Pain is 7 out of 10 in intensity.  No shortness of breath.  No nausea or vomiting.  Bit anxious.   Assessment/Plan:  Recurrent acute pulmonary embolism Was on Eliquis prior to admission but was taking it only once a day.  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  He mentions that he will be compliant going forward. Recurrent episode of embolism likely due to noncompliance. In view of his noncompliance do not think that we need to change his anticoagulant going forward. Lower extremity ultrasounds are pending. Echocardiogram is pending. Continue with IV heparin for today.  Transition back to Eliquis tomorrow but we will likely have to give him the high-dose 10 mg twice a day for 7 days and then he can continue with the 5 mg twice a day.  Diabetes mellitus type 2 Monitor CBGs.  SSI.  Essential hypertension Monitor blood pressures.  Tobacco abuse He is down to 3 cigarettes/day. He was congratulated and encouraged to stop smoking completely.    DVT Prophylaxis: On IV heparin Code Status: Full code Family Communication: Discussed with patient Disposition Plan: Hopefully return home when improved  Status is: Inpatient Remains inpatient appropriate  because: Acute pulmonary embolism      Medications: Scheduled:  Chlorhexidine Gluconate Cloth  6 each Topical Q0600   insulin aspart  0-15 Units Subcutaneous TID WC   lidocaine  1 patch Transdermal Q24H   Continuous:  heparin 1,350 Units/hr (12/08/22 0449)   [id number] **OR** acetaminophen, ondansetron **OR** ondansetron (ZOFRAN) IV, oxyCODONE  Antibiotics: Anti-infectives (From admission, onward)    None       Objective:  Vital Signs  Vitals:   12/08/22 0600 12/08/22 0700 12/08/22 0800 12/08/22 0900  BP: (!) 151/101 (!) 149/89 (!) 157/80 (!) 151/98  Pulse: 65 (!) 59 70 69  Resp: 17 12 (!) 21 15  Temp:   98.4 F (36.9 C)   TempSrc:   Oral   SpO2: 96% 96% 95% 95%  Weight:      Height:        Intake/Output Summary (Last 24 hours) at 12/08/2022 1018 Last data filed at 12/08/2022 0449 Gross per 24 hour  Intake 204.91 ml  Output --  Net 204.91 ml   Filed Weights   12/07/22 1145 12/08/22 0343  Weight: 88.5 kg 83.3 kg    General appearance: Awake alert.  In no distress Resp: Clear to auscultation bilaterally.  Normal effort Cardio: S1-S2 is normal regular.  No S3-S4.  No rubs murmurs or bruit GI: Abdomen is soft.  Nontender nondistended.  Bowel sounds are present normal.  No masses organomegaly Extremities: No edema.  Full range of motion of lower extremities. Neurologic: Alert and oriented x3.  No focal neurological deficits.    Lab Results:  Data Reviewed:  I have personally reviewed following labs and reports of the imaging studies  CBC: Recent Labs  Lab 12/07/22 1251 12/08/22 0313  WBC 4.9 6.5  NEUTROABS  --  2.3  HGB 14.8 13.2  HCT 44.6 39.8  MCV 95.7 96.6  PLT 200 0000000    Basic Metabolic Panel: Recent Labs  Lab 12/07/22 1251 12/08/22 0313  NA 140 137  K 4.0 4.1  CL 107 104  CO2 25 26  GLUCOSE 113* 110*  BUN 12 13  CREATININE 0.96 1.12  CALCIUM 8.5* 8.5*  MG  --  1.7    GFR: Estimated Creatinine Clearance: 72.4 mL/min (by  C-G formula based on SCr of 1.12 mg/dL).  Liver Function Tests: Recent Labs  Lab 12/08/22 0313  AST 22  ALT 22  ALKPHOS 48  BILITOT 1.0  PROT 6.3*  ALBUMIN 3.4*     CBG: Recent Labs  Lab 12/08/22 0330 12/08/22 0746  GLUCAP 116* 133*    Radiology Studies: CT Angio Chest PE W and/or Wo Contrast  Result Date: 12/07/2022 CLINICAL DATA:  Shortness of breath EXAM: CT ANGIOGRAPHY CHEST CT ABDOMEN AND PELVIS WITH CONTRAST TECHNIQUE: Multidetector CT imaging of the chest was performed using the standard protocol during bolus administration of intravenous contrast. Multiplanar CT image reconstructions and MIPs were obtained to evaluate the vascular anatomy. Multidetector CT imaging of the abdomen and pelvis was performed using the standard protocol during bolus administration of intravenous contrast. RADIATION DOSE REDUCTION: This exam was performed according to the departmental dose-optimization program which includes automated exposure control, adjustment of the mA and/or kV according to patient size and/or use of iterative reconstruction technique. CONTRAST:  179mL OMNIPAQUE IOHEXOL 350 MG/ML SOLN COMPARISON:  Chest CTA dated November 01, 2012 FINDINGS: CTA CHEST FINDINGS Cardiovascular: Filling defects of a subsegmental right lower lobe pulmonary artery. Linear opacity of the right lower lobe lobar artery at site of previously seen pulmonary embolus, consistent with chronic post thrombotic change. Normal heart size. No evidence of right heart strain. No pericardial effusion. Normal caliber thoracic aorta with no atherosclerotic disease. Mediastinum/Nodes: Esophagus and thyroid are unremarkable. No pathologically enlarged lymph nodes seen in the chest. Lungs/Pleura: Central airways are patent. Elevation of the left hemidiaphragm. Bibasilar atelectasis. No consolidation, pleural effusion or pneumothorax. Musculoskeletal: No chest wall abnormality. No acute or significant osseous findings. Review of  the MIP images confirms the above findings. CT ABDOMEN and PELVIS FINDINGS Hepatobiliary: No focal liver abnormality is seen. No gallstones, gallbladder wall thickening, or biliary dilatation. Pancreas: Unremarkable. No pancreatic ductal dilatation or surrounding inflammatory changes. Spleen: Bilateral adrenal glands are unremarkable. Adrenals/Urinary Tract: Surgically absent left kidney. Normal appearance of the right kidney. Layering hyperdensity seen in the bladder, likely excreted contrast. Stomach/Bowel: Stomach is within normal limits. Appendix appears normal. No evidence of bowel wall thickening, distention, or inflammatory changes. Vascular/Lymphatic: Aortic atherosclerosis. No enlarged abdominal or pelvic lymph nodes. Reproductive: Prostatomegaly, measuring up to 5.7 cm. Other: No abdominal wall hernia or abnormality. No abdominopelvic ascites. Musculoskeletal: No acute or significant osseous findings. IMPRESSION: 1. Findings consistent with acute on chronic pulmonary embolus. Small acute appearing subsegmental pulmonary embolus of the right lower lobe. Additional linear filling defect of the right lobar pulmonary artery which is likely chronic post thrombotic change. 2. Marked elevation of the left hemidiaphragm and bibasilar atelectasis. 3. No acute findings in the abdomen or pelvis. Critical Value/emergent results were called by telephone at the time of interpretation on 12/07/2022 at 5:04 pm to provider Dr. Roderic Palau, who verbally  acknowledged these results. Electronically Signed   By: Yetta Glassman M.D.   On: 12/07/2022 17:06   CT ABDOMEN PELVIS W CONTRAST  Result Date: 12/07/2022 CLINICAL DATA:  Shortness of breath EXAM: CT ANGIOGRAPHY CHEST CT ABDOMEN AND PELVIS WITH CONTRAST TECHNIQUE: Multidetector CT imaging of the chest was performed using the standard protocol during bolus administration of intravenous contrast. Multiplanar CT image reconstructions and MIPs were obtained to evaluate the  vascular anatomy. Multidetector CT imaging of the abdomen and pelvis was performed using the standard protocol during bolus administration of intravenous contrast. RADIATION DOSE REDUCTION: This exam was performed according to the departmental dose-optimization program which includes automated exposure control, adjustment of the mA and/or kV according to patient size and/or use of iterative reconstruction technique. CONTRAST:  161mL OMNIPAQUE IOHEXOL 350 MG/ML SOLN COMPARISON:  Chest CTA dated November 01, 2012 FINDINGS: CTA CHEST FINDINGS Cardiovascular: Filling defects of a subsegmental right lower lobe pulmonary artery. Linear opacity of the right lower lobe lobar artery at site of previously seen pulmonary embolus, consistent with chronic post thrombotic change. Normal heart size. No evidence of right heart strain. No pericardial effusion. Normal caliber thoracic aorta with no atherosclerotic disease. Mediastinum/Nodes: Esophagus and thyroid are unremarkable. No pathologically enlarged lymph nodes seen in the chest. Lungs/Pleura: Central airways are patent. Elevation of the left hemidiaphragm. Bibasilar atelectasis. No consolidation, pleural effusion or pneumothorax. Musculoskeletal: No chest wall abnormality. No acute or significant osseous findings. Review of the MIP images confirms the above findings. CT ABDOMEN and PELVIS FINDINGS Hepatobiliary: No focal liver abnormality is seen. No gallstones, gallbladder wall thickening, or biliary dilatation. Pancreas: Unremarkable. No pancreatic ductal dilatation or surrounding inflammatory changes. Spleen: Bilateral adrenal glands are unremarkable. Adrenals/Urinary Tract: Surgically absent left kidney. Normal appearance of the right kidney. Layering hyperdensity seen in the bladder, likely excreted contrast. Stomach/Bowel: Stomach is within normal limits. Appendix appears normal. No evidence of bowel wall thickening, distention, or inflammatory changes.  Vascular/Lymphatic: Aortic atherosclerosis. No enlarged abdominal or pelvic lymph nodes. Reproductive: Prostatomegaly, measuring up to 5.7 cm. Other: No abdominal wall hernia or abnormality. No abdominopelvic ascites. Musculoskeletal: No acute or significant osseous findings. IMPRESSION: 1. Findings consistent with acute on chronic pulmonary embolus. Small acute appearing subsegmental pulmonary embolus of the right lower lobe. Additional linear filling defect of the right lobar pulmonary artery which is likely chronic post thrombotic change. 2. Marked elevation of the left hemidiaphragm and bibasilar atelectasis. 3. No acute findings in the abdomen or pelvis. Critical Value/emergent results were called by telephone at the time of interpretation on 12/07/2022 at 5:04 pm to provider Dr. Roderic Palau, who verbally acknowledged these results. Electronically Signed   By: Yetta Glassman M.D.   On: 12/07/2022 17:06   DG Chest 2 View  Result Date: 12/07/2022 CLINICAL DATA:  Chest pain. EXAM: CHEST - 2 VIEW COMPARISON:  Chest x-ray October 31, 2012. FINDINGS: Elevated left hemidiaphragm. No consolidation. No visible pleural effusions or pneumothorax. Cardiomediastinal silhouette is unchanged. IMPRESSION: No active cardiopulmonary disease. Electronically Signed   By: Margaretha Sheffield M.D.   On: 12/07/2022 12:28       LOS: 1 day   [facility] Pager on [email or web address]  12/08/2022, 10:18 AM

## 2022-12-08 NOTE — Progress Notes (Signed)
ANTICOAGULATION CONSULT NOTE   Pharmacy Consult for heparin  Indication: pulmonary embolus  Allergies  Allergen Reactions   Codeine Shortness Of Breath    Patient Measurements: Height: 5\' 10"  (177.8 cm) Weight: 83.3 kg (183 lb 10.3 oz) IBW/kg (Calculated) : 73 Heparin Dosing Weight: 88.5 kg  Vital Signs: Temp: 98.2 F (36.8 C) (03/27 0343) Temp Source: Oral (03/27 0343) BP: 139/102 (03/27 0000) Pulse Rate: 59 (03/27 0343)  Labs: Recent Labs    12/07/22 1251 12/07/22 1447 12/08/22 0313  HGB 14.8  --  13.2  HCT 44.6  --  39.8  PLT 200  --  188  APTT  --   --  126*  HEPARINUNFRC  --   --  >1.10*  CREATININE 0.96  --  1.12  TROPONINIHS 4 4  --      Estimated Creatinine Clearance: 72.4 mL/min (by C-G formula based on SCr of 1.12 mg/dL).   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 In place of Coumadin 180 tablet 1 12/07/2022 at 0700   blood glucose meter kit and supplies Dispense  1 Relion Prime glucometer, 1 box of test strips, lancets, alcohol wipes and cotton balls. Use up to four times daily as directed. (FOR ICD-9 250.00, 250.01). 1 each 1    Blood Glucose Monitoring Suppl (RELION PRIME MONITOR) DEVI 1 Device by Does not apply route as directed. Glucometer 1 Device 1    glucose blood (RELION PRIME TEST) test strip Use as instructed to test blood sugar  before meals and before bedtime 100 each 3    Scheduled:   Chlorhexidine Gluconate Cloth  6 each Topical Q0600   insulin aspart  0-15 Units Subcutaneous TID WC   lidocaine  1 patch Transdermal Q24H    Assessment: [name] admitted with cc of chest pain. He has a history of recurrent DVT's and PE. CTA shows an acute on chronic PE. He has been taking apixaban PTA, states he is compliant with a last known dose on 12/07/2022 ~07:00.  A heparin consult is placed.   3/27 AM update:  aPTT supra-therapeutic   Goal of Therapy:  Heparin level 0.3-0.7 units/ml aPTT 66-102 seconds Monitor platelets by anticoagulation protocol: Yes   Plan:  Dec heparin to 1350 units/hr 1200 aPTT/heparin level  Narda Bonds, PharmD, BCPS Clinical Pharmacist Phone: [phone]

## 2022-12-08 NOTE — Progress Notes (Signed)
Pt arrived to room 325 via WC from ICU. Pt ambulatory from chair to bed without difficulty. Oriented to room and safety procedures, call bell within reach, advised to call for needs. Pt states understanding.

## 2022-12-08 NOTE — TOC Progression Note (Signed)
  Transition of Care [facility]) Screening Note   Patient Details  Name: KENYUN LUMLEY Date of Birth: May 11, 1961   [facility] CM/SW Contact:    Shade Flood, LCSW Phone Number: 12/08/2022, 10:13 AM    Transition of Care Department [facility]) has reviewed patient and no TOC needs have been identified at this time. We will continue to monitor patient advancement through interdisciplinary progression rounds. If new patient transition needs arise, please place a TOC consult.

## 2022-12-08 NOTE — Progress Notes (Signed)
  Echocardiogram 2D Echocardiogram has been performed.  Wynelle Link 12/08/2022, 11:24 AM

## 2022-12-08 NOTE — ED Notes (Signed)
Lab in room.

## 2022-12-08 NOTE — Progress Notes (Signed)
[place] for IV heparin  Indication: pulmonary embolus  Allergies  Allergen Reactions   Codeine Shortness Of Breath    Patient Measurements: Height: 5\' 10"  (177.8 cm) Weight: 83.3 kg (183 lb 10.3 oz) IBW/kg (Calculated) : 73 Heparin Dosing Weight: 88.5 kg  Vital Signs: Temp: 98.7 F (37.1 C) (03/27 1400) Temp Source: Oral (03/27 1400) BP: 151/105 (03/27 1400) Pulse Rate: 67 (03/27 1400)  Labs: Recent Labs    12/07/22 1251 12/07/22 1447 12/08/22 0313 12/08/22 1148 12/08/22 1944  HGB 14.8  --  13.2  --   --   HCT 44.6  --  39.8  --   --   PLT 200  --  188  --   --   APTT  --   --  126* 98* 93*  HEPARINUNFRC  --   --  >1.10* 1.08* 0.72*  CREATININE 0.96  --  1.12  --   --   TROPONINIHS 4 4  --   --   --     Estimated Creatinine Clearance: 72.4 mL/min (by C-G formula based on SCr of 1.12 mg/dL).   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 In place of Coumadin 180 tablet 1 12/07/2022 at 0700   blood glucose meter kit and supplies Dispense  1 Relion Prime glucometer, 1 box of test strips, lancets, alcohol wipes and cotton balls. Use up to four times daily as directed. (FOR ICD-9 250.00, 250.01). 1 each 1    Blood Glucose Monitoring Suppl (RELION PRIME MONITOR) DEVI 1 Device by Does not apply route as directed. Glucometer 1  Device 1    glucose blood (RELION PRIME TEST) test strip Use as instructed to test blood sugar before meals and before bedtime 100 each 3    Scheduled:   Chlorhexidine Gluconate Cloth  6 each Topical Q0600   insulin aspart  0-15 Units Subcutaneous TID WC   lidocaine  1 patch Transdermal Q24H    Assessment: [name] admitted with cc of chest pain. He has a history of recurrent DVT's and PE. CTA shows an acute on chronic PE. He has been taking apixaban PTA, states he is compliant with a last known dose on 12/07/2022 ~07:00.  Pharmacy consulted to dose heparin.  aPTT 93, therapeutic  No overt s/sx of bleeding. CBC stable.   Goal of Therapy:  Heparin level 0.3-0.7 units/ml aPTT 66-102 seconds Monitor platelets by anticoagulation protocol: Yes   Plan:  Continue heparin infusion at 1350 units/hr Heparin levels and CBC daily Follow aPTT and heparin levels to transition to heparin levels for monitoring when correlating  Follow up plans for long term anticoagulation  Thank you for allowing pharmacy to participate in this patient's care.  Levonne Spiller, PharmD Clinical Pharmacist 12/08/2022,8:23 PM

## 2022-12-09 DIAGNOSIS — I2699 Other pulmonary embolism without acute cor pulmonale: Secondary | ICD-10-CM | POA: Diagnosis not present

## 2022-12-09 LAB — HEMOGLOBIN A1C
Hgb A1c MFr Bld: 7.2 % — ABNORMAL HIGH (ref 4.8–5.6)
Mean Plasma Glucose: 160 mg/dL

## 2022-12-09 LAB — CBC
HCT: 38.2 % — ABNORMAL LOW (ref 39.0–52.0)
Hemoglobin: 12.8 g/dL — ABNORMAL LOW (ref 13.0–17.0)
MCH: 31.8 pg (ref 26.0–34.0)
MCHC: 33.5 g/dL (ref 30.0–36.0)
MCV: 95 fL (ref 80.0–100.0)
Platelets: 180 10*3/uL (ref 150–400)
RBC: 4.02 MIL/uL — ABNORMAL LOW (ref 4.22–5.81)
RDW: 12.5 % (ref 11.5–15.5)
WBC: 6.3 10*3/uL (ref 4.0–10.5)
nRBC: 0 % (ref 0.0–0.2)

## 2022-12-09 LAB — HEPARIN LEVEL (UNFRACTIONATED): Heparin Unfractionated: 0.53 IU/mL (ref 0.30–0.70)

## 2022-12-09 LAB — APTT: aPTT: 81 seconds — ABNORMAL HIGH (ref 24–36)

## 2022-12-09 LAB — GLUCOSE, CAPILLARY: Glucose-Capillary: 111 mg/dL — ABNORMAL HIGH (ref 70–99)

## 2022-12-09 MED ORDER — APIXABAN 5 MG PO TABS
10.0000 mg | ORAL_TABLET | Freq: Two times a day (BID) | ORAL | Status: DC
  Administered 2022-12-09: 10 mg via ORAL
  Filled 2022-12-09: qty 2

## 2022-12-09 MED ORDER — APIXABAN 5 MG PO TABS: 5.0000 mg | ORAL_TABLET | Freq: Two times a day (BID) | ORAL | Status: DC

## 2022-12-09 MED ORDER — APIXABAN 5 MG PO TABS: 5.0000 mg | ORAL_TABLET | Freq: Two times a day (BID) | ORAL | 3 refills | Status: AC

## 2022-12-09 MED ORDER — OXYCODONE HCL 5 MG PO TABS: 5.0000 mg | ORAL_TABLET | Freq: Four times a day (QID) | ORAL | 0 refills | Status: DC | PRN

## 2022-12-09 MED ORDER — APIXABAN (ELIQUIS) VTE STARTER PACK (10MG AND 5MG): ORAL_TABLET | ORAL | 0 refills | Status: DC

## 2022-12-09 NOTE — Plan of Care (Signed)
  Problem: Education: Goal: Knowledge of General Education information will improve Description: Including pain rating scale, medication(s)/side effects and non-pharmacologic comfort measures 12/09/2022 0938 by Melony Overly, RN Outcome: Adequate for Discharge 12/09/2022 0848 by Melony Overly, RN Outcome: Progressing   Problem: Health Behavior/Discharge Planning: Goal: Ability to manage health-related needs will improve 12/09/2022 0938 by Melony Overly, RN Outcome: Adequate for Discharge 12/09/2022 0848 by Melony Overly, RN Outcome: Progressing   Problem: Clinical Measurements: Goal: Ability to maintain clinical measurements within normal limits will improve 12/09/2022 0938 by Melony Overly, RN Outcome: Adequate for Discharge 12/09/2022 0848 by Melony Overly, RN Outcome: Progressing Goal: Will remain free from infection 12/09/2022 0938 by Melony Overly, RN Outcome: Adequate for Discharge 12/09/2022 0848 by Melony Overly, RN Outcome: Progressing Goal: Diagnostic test results will improve 12/09/2022 0938 by Melony Overly, RN Outcome: Adequate for Discharge 12/09/2022 0848 by Melony Overly, RN Outcome: Progressing Goal: Respiratory complications will improve 12/09/2022 0938 by Melony Overly, RN Outcome: Adequate for Discharge 12/09/2022 0848 by Melony Overly, RN Outcome: Progressing Goal: Cardiovascular complication will be avoided 12/09/2022 [id number] by Melony Overly, RN Outcome: Adequate for Discharge 12/09/2022 0848 by Melony Overly, RN Outcome: Progressing   Problem: Activity: Goal: Risk for activity intolerance will decrease 12/09/2022 0938 by Melony Overly, RN Outcome: Adequate for Discharge 12/09/2022 0848 by Melony Overly, RN Outcome: Progressing   Problem: Nutrition: Goal: Adequate nutrition will be maintained 12/09/2022 0938 by Melony Overly, RN Outcome: Adequate for Discharge 12/09/2022 0848 by Melony Overly, RN Outcome: Progressing    Problem: Coping: Goal: Level of anxiety will decrease 12/09/2022 0938 by Melony Overly, RN Outcome: Adequate for Discharge 12/09/2022 0848 by Melony Overly, RN Outcome: Progressing   Problem: Elimination: Goal: Will not experience complications related to bowel motility 12/09/2022 0938 by Melony Overly, RN Outcome: Adequate for Discharge 12/09/2022 0848 by Melony Overly, RN Outcome: Progressing Goal: Will not experience complications related to urinary retention 12/09/2022 0938 by Melony Overly, RN Outcome: Adequate for Discharge 12/09/2022 0848 by Melony Overly, RN Outcome: Progressing   Problem: Pain Managment: Goal: General experience of comfort will improve 12/09/2022 0938 by Melony Overly, RN Outcome: Adequate for Discharge 12/09/2022 0848 by Melony Overly, RN Outcome: Progressing   Problem: Safety: Goal: Ability to remain free from injury will improve 12/09/2022 0938 by Melony Overly, RN Outcome: Adequate for Discharge 12/09/2022 0848 by Melony Overly, RN Outcome: Progressing   Problem: Skin Integrity: Goal: Risk for impaired skin integrity will decrease 12/09/2022 0938 by Melony Overly, RN Outcome: Adequate for Discharge 12/09/2022 0848 by Melony Overly, RN Outcome: Progressing

## 2022-12-09 NOTE — Discharge Summary (Signed)
[facility]  Physician Discharge Summary   Patient ID: Gregory Andrews MRN: [id number] DOB/AGE: 11-02-1960 62 y.o.  Admit date: 12/07/2022 Discharge date: 12/09/2022    PCP: Gregory Beard, MD  DISCHARGE DIAGNOSES:    Acute pulmonary embolism ([place])   Chronic anticoagulation for Recurrent DVT/PE   Tobacco abuse   Essential hypertension   Type 2 diabetes mellitus with other specified complication ([place])   RECOMMENDATIONS FOR OUTPATIENT FOLLOW UP: Patient told to be compliant with his anticoagulation    Home Health: None Equipment/Devices: None  CODE STATUS: Full code  DISCHARGE CONDITION: fair  Diet recommendation: As before  INITIAL HISTORY: 62 y.o. male with medical history significant of diabetes mellitus type 2, hyperlipidemia, hypertension, PE/DVT, presented with left-sided chest pain.  He was found to have acute pulmonary embolism.  Initially gave the impression that he was compliant with his Eliquis.  But today he mentioned that he was taking it only once a day.     HOSPITAL COURSE:   Recurrent acute pulmonary embolism Was on Eliquis prior to admission but was taking it only once a day.  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  He mentions that he will be compliant going forward. Recurrent episode of embolism likely due to noncompliance. In view of his noncompliance do not think that we need to change his anticoagulant going forward. Lower extremity ultrasounds revealed only chronic DVT without any evidence of acute DVT. Echocardiogram shows normal left ventricular systolic function.  Right ventricular systolic function is normal.  No significant valvular disease noted. Patient was initially placed on IV heparin.  Transition to Eliquis this morning.  Continues to have pleuritic pain on the left chest.  Will be prescribed pain medications at discharge.   Diabetes mellitus type 2   Essential hypertension    Tobacco abuse He is down to 3 cigarettes/day. He was congratulated and encouraged to stop smoking completely.   Patient is stable.  Okay for discharge home today.   PERTINENT LABS:  The results of significant diagnostics from this hospitalization (including imaging, microbiology, ancillary and laboratory) are listed below for reference.     Labs:   Basic Metabolic Panel: Recent Labs  Lab 12/07/22 1251 12/08/22 0313  NA 140 137  K 4.0 4.1  CL 107 104  CO2 25 26  GLUCOSE 113* 110*  BUN 12 13  CREATININE 0.96 1.12  CALCIUM 8.5* 8.5*  MG  --  1.7   Liver Function Tests: Recent Labs  Lab 12/08/22 0313  AST 22  ALT 22  ALKPHOS 48  BILITOT 1.0  PROT 6.3*  ALBUMIN 3.4*    CBC: Recent Labs  Lab 12/07/22 1251 12/08/22 0313 12/09/22 0523  WBC 4.9 6.5 6.3  NEUTROABS  --  2.3  --   HGB 14.8 13.2 12.8*  HCT 44.6 39.8 38.2*  MCV 95.7 96.6 95.0  PLT 200 188 180    BNP: BNP (last 3 results) Recent Labs    12/07/22 1251  BNP 9.0     CBG: Recent Labs  Lab 12/08/22 0746 12/08/22 1150 12/08/22 1635 12/08/22 2050 12/09/22 0710  GLUCAP 133* 129* 139* 134* 111*     IMAGING STUDIES ECHOCARDIOGRAM COMPLETE  Result Date: 12/08/2022    ECHOCARDIOGRAM REPORT   Patient Name:   Gregory Andrews Date of Exam: 12/08/2022 Medical Rec #:  [id number]           Height:       70.0  in Accession #:    [id number]          Weight:       183.6 lb Date of Birth:  [birth year]          BSA:          2.013 m Patient Age:    62 years            BP:           165/95 mmHg Patient Gender: M                   HR:           68 bpm. Exam Location:  [facility] Procedure: 2D Echo, Cardiac Doppler and Color Doppler Indications:    Pulmonary Embolus I26.09  History:        Patient has prior history of Echocardiogram examinations, most                 recent 11/01/2012. P.E., Arrythmias:LBBB; Risk                 Factors:Hypertension, Diabetes, Former Smoker and Dyslipidemia.  Sonographer:     Greer Pickerel Referring Phys: [id number] ASIA B [place]  Sonographer Comments: No parasternal window and Technically difficult study due to poor echo windows. Image acquisition challenging due to respiratory motion and Image acquisition challenging due to patient body habitus. IMPRESSIONS  1. Left ventricular ejection fraction, by estimation, is 55 to 60%. The left ventricle has normal function. The left ventricle has no regional wall motion abnormalities. Left ventricular diastolic parameters are indeterminate.  2. Right ventricular systolic function is normal. The right ventricular size is normal. There is normal pulmonary artery systolic pressure. The estimated right ventricular systolic pressure is 123456 mmHg.  3. The mitral valve is grossly normal. Trivial mitral valve regurgitation.  4. The aortic valve was not well visualized. Aortic valve regurgitation is not visualized.  5. The inferior vena cava is dilated in size with <50% respiratory variability, suggesting right atrial pressure of 15 mmHg. Comparison(s): Prior images unable to be directly viewed. FINDINGS  Left Ventricle: Left ventricular ejection fraction, by estimation, is 55 to 60%. The left ventricle has normal function. The left ventricle has no regional wall motion abnormalities. The left ventricular internal cavity size was normal in size. There is  no left ventricular hypertrophy. Left ventricular diastolic parameters are indeterminate. Right Ventricle: The right ventricular size is normal. No increase in right ventricular wall thickness. Right ventricular systolic function is normal. There is normal pulmonary artery systolic pressure. The tricuspid regurgitant velocity is 0.81 m/s, and  with an assumed right atrial pressure of 15 mmHg, the estimated right ventricular systolic pressure is 123456 mmHg. Left Atrium: Left atrial size was normal in size. Right Atrium: Right atrial size was normal in size. Pericardium: Trivial pericardial effusion is  present. The pericardial effusion is posterior to the left ventricle. Mitral Valve: The mitral valve is grossly normal. There is mild calcification of the mitral valve leaflet(s). Trivial mitral valve regurgitation. Tricuspid Valve: The tricuspid valve is grossly normal. Tricuspid valve regurgitation is mild. Aortic Valve: The aortic valve was not well visualized. Aortic valve regurgitation is not visualized. Pulmonic Valve: The pulmonic valve was not well visualized. Pulmonic valve regurgitation is trivial. Aorta: The aortic root is normal in size and structure. Venous: The inferior vena cava is dilated in size with less than 50% respiratory variability, suggesting right atrial pressure of 15 mmHg. IAS/Shunts: No atrial  level shunt detected by color flow Doppler.  LEFT VENTRICLE PLAX 2D LVIDd:         4.70 cm   Diastology LVIDs:         3.50 cm   LV e' medial:    8.05 cm/s LV PW:         1.00 cm   LV E/e' medial:  5.9 LV IVS:        0.80 cm   LV e' lateral:   11.10 cm/s LVOT diam:     1.60 cm   LV E/e' lateral: 4.2 LV SV:         29 LV SV Index:   15 LVOT Area:     2.01 cm  RIGHT VENTRICLE RV S prime:     10.20 cm/s TAPSE (M-mode): 2.1 cm LEFT ATRIUM             Index        RIGHT ATRIUM           Index LA diam:        3.50 cm 1.74 cm/m   RA Area:     19.70 cm LA Vol (A2C):   73.9 ml 36.71 ml/m  RA Volume:   62.60 ml  31.10 ml/m LA Vol (A4C):   48.9 ml 24.29 ml/m LA Biplane Vol: 61.4 ml 30.50 ml/m  AORTIC VALVE LVOT Vmax:   82.90 cm/s LVOT Vmean:  50.600 cm/s LVOT VTI:    0.146 m  AORTA Ao Root diam: 3.40 cm MITRAL VALVE               TRICUSPID VALVE MV Area (PHT): 2.37 cm    TR Peak grad:   2.7 mmHg MV Decel Time: 320 msec    TR Vmax:        81.40 cm/s MV E velocity: 47.10 cm/s MV A velocity: 57.80 cm/s  SHUNTS MV E/A ratio:  0.81        Systemic VTI:  0.15 m                            Systemic Diam: 1.60 cm Rozann Lesches MD Electronically signed by Rozann Lesches MD Signature Date/Time:  12/08/2022/11:39:39 AM    Final    US Venous Img Lower Bilateral (DVT)  Result Date: 12/08/2022 CLINICAL DATA:  Pulmonary emboli with history of deep venous thrombosis EXAM: BILATERAL LOWER EXTREMITY VENOUS DOPPLER ULTRASOUND TECHNIQUE: Gray-scale sonography with compression, as well as color and duplex ultrasound, were performed to evaluate the deep venous system(s) from the level of the common femoral vein through the popliteal and proximal calf veins. COMPARISON:  None Available. FINDINGS: VENOUS Normal compressibility of the bilateral common femoral and popliteal veins, as well as the left superficial femoral and visualized calf veins. Visualized portions of bilateral profunda femoral vein and great saphenous vein unremarkable. No filling defects to suggest DVT on grayscale or color Doppler imaging. Doppler waveforms show normal direction of venous flow, normal respiratory plasticity and response to augmentation these vessels. Nonocclusive, eccentric echogenic thrombus within the distal right femoral vein as well as segmental, nonocclusive, chronic-appearing thrombi within the right peroneal and posterior tibial veins. OTHER None. Limitations: none IMPRESSION: 1. Nonocclusive chronic-appearing thrombi within the distal right femoral vein and right peroneal and posterior tibial veins. 2. No evidence of acute deep venous thrombosis within the bilateral lower extremities. These results will be called to the ordering clinician or representative by the Radiologist  Environmental consultant, and communication documented in the PACS or [employer]. Electronically Signed   By: Darrin Nipper M.D.   On: 12/08/2022 10:34   CT Angio Chest PE W and/or Wo Contrast  Result Date: 12/07/2022 CLINICAL DATA:  Shortness of breath EXAM: CT ANGIOGRAPHY CHEST CT ABDOMEN AND PELVIS WITH CONTRAST TECHNIQUE: Multidetector CT imaging of the chest was performed using the standard protocol during bolus administration of intravenous contrast.  Multiplanar CT image reconstructions and MIPs were obtained to evaluate the vascular anatomy. Multidetector CT imaging of the abdomen and pelvis was performed using the standard protocol during bolus administration of intravenous contrast. RADIATION DOSE REDUCTION: This exam was performed according to the departmental dose-optimization program which includes automated exposure control, adjustment of the mA and/or kV according to patient size and/or use of iterative reconstruction technique. CONTRAST:  16mL OMNIPAQUE IOHEXOL 350 MG/ML SOLN COMPARISON:  Chest CTA dated November 01, 2012 FINDINGS: CTA CHEST FINDINGS Cardiovascular: Filling defects of a subsegmental right lower lobe pulmonary artery. Linear opacity of the right lower lobe lobar artery at site of previously seen pulmonary embolus, consistent with chronic post thrombotic change. Normal heart size. No evidence of right heart strain. No pericardial effusion. Normal caliber thoracic aorta with no atherosclerotic disease. Mediastinum/Nodes: Esophagus and thyroid are unremarkable. No pathologically enlarged lymph nodes seen in the chest. Lungs/Pleura: Central airways are patent. Elevation of the left hemidiaphragm. Bibasilar atelectasis. No consolidation, pleural effusion or pneumothorax. Musculoskeletal: No chest wall abnormality. No acute or significant osseous findings. Review of the MIP images confirms the above findings. CT ABDOMEN and PELVIS FINDINGS Hepatobiliary: No focal liver abnormality is seen. No gallstones, gallbladder wall thickening, or biliary dilatation. Pancreas: Unremarkable. No pancreatic ductal dilatation or surrounding inflammatory changes. Spleen: Bilateral adrenal glands are unremarkable. Adrenals/Urinary Tract: Surgically absent left kidney. Normal appearance of the right kidney. Layering hyperdensity seen in the bladder, likely excreted contrast. Stomach/Bowel: Stomach is within normal limits. Appendix appears normal. No evidence of  bowel wall thickening, distention, or inflammatory changes. Vascular/Lymphatic: Aortic atherosclerosis. No enlarged abdominal or pelvic lymph nodes. Reproductive: Prostatomegaly, measuring up to 5.7 cm. Other: No abdominal wall hernia or abnormality. No abdominopelvic ascites. Musculoskeletal: No acute or significant osseous findings. IMPRESSION: 1. Findings consistent with acute on chronic pulmonary embolus. Small acute appearing subsegmental pulmonary embolus of the right lower lobe. Additional linear filling defect of the right lobar pulmonary artery which is likely chronic post thrombotic change. 2. Marked elevation of the left hemidiaphragm and bibasilar atelectasis. 3. No acute findings in the abdomen or pelvis. Critical Value/emergent results were called by telephone at the time of interpretation on 12/07/2022 at 5:04 pm to provider Dr. Roderic Palau, who verbally acknowledged these results. Electronically Signed   By: Yetta Glassman M.D.   On: 12/07/2022 17:06   CT ABDOMEN PELVIS W CONTRAST  Result Date: 12/07/2022 CLINICAL DATA:  Shortness of breath EXAM: CT ANGIOGRAPHY CHEST CT ABDOMEN AND PELVIS WITH CONTRAST TECHNIQUE: Multidetector CT imaging of the chest was performed using the standard protocol during bolus administration of intravenous contrast. Multiplanar CT image reconstructions and MIPs were obtained to evaluate the vascular anatomy. Multidetector CT imaging of the abdomen and pelvis was performed using the standard protocol during bolus administration of intravenous contrast. RADIATION DOSE REDUCTION: This exam was performed according to the departmental dose-optimization program which includes automated exposure control, adjustment of the mA and/or kV according to patient size and/or use of iterative reconstruction technique. CONTRAST:  141mL OMNIPAQUE IOHEXOL 350 MG/ML SOLN COMPARISON:  Chest CTA  dated November 01, 2012 FINDINGS: CTA CHEST FINDINGS Cardiovascular: Filling defects of a  subsegmental right lower lobe pulmonary artery. Linear opacity of the right lower lobe lobar artery at site of previously seen pulmonary embolus, consistent with chronic post thrombotic change. Normal heart size. No evidence of right heart strain. No pericardial effusion. Normal caliber thoracic aorta with no atherosclerotic disease. Mediastinum/Nodes: Esophagus and thyroid are unremarkable. No pathologically enlarged lymph nodes seen in the chest. Lungs/Pleura: Central airways are patent. Elevation of the left hemidiaphragm. Bibasilar atelectasis. No consolidation, pleural effusion or pneumothorax. Musculoskeletal: No chest wall abnormality. No acute or significant osseous findings. Review of the MIP images confirms the above findings. CT ABDOMEN and PELVIS FINDINGS Hepatobiliary: No focal liver abnormality is seen. No gallstones, gallbladder wall thickening, or biliary dilatation. Pancreas: Unremarkable. No pancreatic ductal dilatation or surrounding inflammatory changes. Spleen: Bilateral adrenal glands are unremarkable. Adrenals/Urinary Tract: Surgically absent left kidney. Normal appearance of the right kidney. Layering hyperdensity seen in the bladder, likely excreted contrast. Stomach/Bowel: Stomach is within normal limits. Appendix appears normal. No evidence of bowel wall thickening, distention, or inflammatory changes. Vascular/Lymphatic: Aortic atherosclerosis. No enlarged abdominal or pelvic lymph nodes. Reproductive: Prostatomegaly, measuring up to 5.7 cm. Other: No abdominal wall hernia or abnormality. No abdominopelvic ascites. Musculoskeletal: No acute or significant osseous findings. IMPRESSION: 1. Findings consistent with acute on chronic pulmonary embolus. Small acute appearing subsegmental pulmonary embolus of the right lower lobe. Additional linear filling defect of the right lobar pulmonary artery which is likely chronic post thrombotic change. 2. Marked elevation of the left hemidiaphragm and  bibasilar atelectasis. 3. No acute findings in the abdomen or pelvis. Critical Value/emergent results were called by telephone at the time of interpretation on 12/07/2022 at 5:04 pm to provider Dr. Roderic Palau, who verbally acknowledged these results. Electronically Signed   By: Yetta Glassman M.D.   On: 12/07/2022 17:06   DG Chest 2 View  Result Date: 12/07/2022 CLINICAL DATA:  Chest pain. EXAM: CHEST - 2 VIEW COMPARISON:  Chest x-ray October 31, 2012. FINDINGS: Elevated left hemidiaphragm. No consolidation. No visible pleural effusions or pneumothorax. Cardiomediastinal silhouette is unchanged. IMPRESSION: No active cardiopulmonary disease. Electronically Signed   By: Margaretha Sheffield M.D.   On: 12/07/2022 12:28    DISCHARGE EXAMINATION: Vitals:   12/08/22 1200 12/08/22 1400 12/08/22 2046 12/09/22 0427  BP:  (!) 151/105 (!) 148/88 (!) 151/93  Pulse: 70 67 (!) 58 100  Resp:  16 17 18   Temp:  98.7 F (37.1 C) 98.2 F (36.8 C) 98.2 F (36.8 C)  TempSrc:  Oral Oral   SpO2: 98% 100% 98% 99%  Weight:      Height:       General appearance: Awake alert.  In no distress Resp: Clear to auscultation bilaterally.  Normal effort Cardio: S1-S2 is normal regular.  No S3-S4.  No rubs murmurs or bruit GI: Abdomen is soft.  Nontender nondistended.  Bowel sounds are present normal.  No masses organomegaly   DISPOSITION: Home  Discharge Instructions     Call MD for:  difficulty breathing, headache or visual disturbances   Complete by: As directed    Call MD for:  extreme fatigue   Complete by: As directed    Call MD for:  persistant dizziness or light-headedness   Complete by: As directed    Call MD for:  persistant nausea and vomiting   Complete by: As directed    Call MD for:  severe uncontrolled pain   Complete  by: As directed    Call MD for:  temperature >100.4   Complete by: As directed    Diet - low sodium heart healthy   Complete by: As directed    Discharge instructions   Complete  by: As directed    Please take your medications as prescribed.  Please do not miss any doses of your blood thinner.  Take pain medicines as needed AND as instructed.  If pain does not get better in the next week or so please reach out to your primary care provider.  You were cared for by a hospitalist during your hospital stay. If you have any questions about your discharge medications or the care you received while you were in the hospital after you are discharged, you can call the unit and asked to speak with the hospitalist on call if the hospitalist that took care of you is not available. Once you are discharged, your primary care physician will handle any further medical issues. Please note that NO REFILLS for any discharge medications will be authorized once you are discharged, as it is imperative that you return to your primary care physician (or establish a relationship with a primary care physician if you do not have one) for your aftercare needs so that they can reassess your need for medications and monitor your lab values. If you do not have a primary care physician, you can call [phone] for a physician referral.   Increase activity slowly   Complete by: As directed           Allergies as of 12/09/2022       Reactions   Codeine Shortness Of Breath        Medication List     STOP taking these medications    apixaban 5 MG Tabs tablet Commonly known as: Eliquis Replaced by: Apixaban Starter Pack (10mg  and 5mg )       TAKE these medications    Apixaban Starter Pack (10mg  and 5mg ) Commonly known as: ELIQUIS STARTER PACK Take as directed on package: start with two-5mg  tablets twice daily for 7 days. On day 8, switch to one-5mg  tablet twice daily. Replaces: apixaban 5 MG Tabs tablet   apixaban 5 MG Tabs tablet Commonly known as: ELIQUIS Take 1 tablet (5 mg total) by mouth 2 (two) times daily. PLEASE START ONLY AFTER YOU HAVE COMPLETED THE STARTER PACK Start taking on:  January 09, 2023   blood glucose meter kit and supplies Dispense  1 Relion Prime glucometer, 1 box of test strips, lancets, alcohol wipes and cotton balls. Use up to four times daily as directed. (FOR ICD-9 250.00, 250.01).   glucose blood test strip Commonly known as: ReliOn Prime Test Use as instructed to test blood sugar before meals and before bedtime   oxyCODONE 5 MG immediate release tablet Commonly known as: Oxy IR/ROXICODONE Take 1 tablet (5 mg total) by mouth every 6 (six) hours as needed for severe pain.   ReliOn Prime Monitor Devi 1 Device by Does not apply route as directed. Glucometer          Follow-up Information     Gregory Beard, MD. Schedule an appointment as soon as possible for a visit in 1 week(s).   Specialty: Family Medicine Why: post hospitalization follow up Contact information: [address] [place] [address] [phone]                 TOTAL DISCHARGE TIME: 35 minutes  Natasja Niday [place] Pink  [facility] operational officer on [email or web address]  12/09/2022, 10:59 AM

## 2022-12-09 NOTE — Progress Notes (Signed)
ANTICOAGULATION CONSULT NOTE   Pharmacy Consult for IV heparin  Indication: pulmonary embolus  Allergies  Allergen Reactions   Codeine Shortness Of Breath    Patient Measurements: Height: 5\' 10"  (177.8 cm) Weight: 83.3 kg (183 lb 10.3 oz) IBW/kg (Calculated) : 73 Heparin Dosing Weight: 88.5 kg  Vital Signs: Temp: 98.2 F (36.8 C) (03/28 0427) Temp Source: Oral (03/27 2046) BP: 151/93 (03/28 0427) Pulse Rate: 100 (03/28 0427)  Labs: Recent Labs    12/07/22 1251 12/07/22 1251 12/07/22 1447 12/08/22 0313 12/08/22 1148 12/08/22 1944 12/09/22 0523  HGB 14.8  --   --  13.2  --   --  12.8*  HCT 44.6  --   --  39.8  --   --  38.2*  PLT 200  --   --  188  --   --  180  APTT  --    < >  --  126* 98* 93* 81*  HEPARINUNFRC  --    < >  --  >1.10* 1.08* 0.72* 0.53  CREATININE 0.96  --   --  1.12  --   --   --   TROPONINIHS 4  --  4  --   --   --   --    < > = values in this interval not displayed.     Estimated Creatinine Clearance: 72.4 mL/min (by C-G formula based on SCr of 1.12 mg/dL).   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 In place of Coumadin 180 tablet 1 12/07/2022 at 0700   blood glucose meter kit and supplies Dispense  1 Relion Prime glucometer, 1 box of test strips, lancets, alcohol wipes and cotton balls. Use up to four times daily as directed. (FOR  ICD-9 250.00, 250.01). 1 each 1    Blood Glucose Monitoring Suppl (RELION PRIME MONITOR) DEVI 1 Device by Does not apply route as directed. Glucometer 1 Device 1    glucose blood (RELION PRIME TEST) test strip Use as instructed to test blood sugar before meals and before bedtime 100 each 3    Scheduled:   Chlorhexidine Gluconate Cloth  6 each Topical Q0600   insulin aspart  0-15 Units Subcutaneous TID WC   lidocaine  1 patch Transdermal Q24H    Assessment: [name] admitted with cc of chest pain. He has a history of recurrent DVT's and PE. CTA shows an acute on chronic PE. He has been taking apixaban PTA, states he is compliant with a last known dose on 12/07/2022 ~07:00.  Pharmacy consulted to dose heparin.  aPTT 93, therapeutic  HL 0.53- therapeutic  Monitor based on only heparin levels now.  Goal of Therapy:  Heparin level 0.3-0.7 units/ml aPTT 66-102 seconds Monitor platelets by anticoagulation protocol: Yes   Plan:  Continue heparin infusion at 1350 units/hr Heparin levels and  CBC daily Follow up plans for long term anticoagulation  Thank you for allowing pharmacy to participate in this patient's care.  Margot Ables, PharmD Clinical Pharmacist 12/09/2022 7:36 AM

## 2022-12-09 NOTE — Plan of Care (Signed)

## 2023-01-08 ENCOUNTER — Emergency Department (HOSPITAL_COMMUNITY)
Admission: EM | Admit: 2023-01-08 | Discharge: 2023-01-08 | Disposition: A | Discharge status: Home / Self Care | Payer: BLUE CROSS/BLUE SHIELD | Attending: Emergency Medicine | Admitting: Emergency Medicine

## 2023-01-08 ENCOUNTER — Encounter (HOSPITAL_COMMUNITY): Payer: Self-pay | Admitting: Emergency Medicine

## 2023-01-08 ENCOUNTER — Other Ambulatory Visit: Payer: Self-pay

## 2023-01-08 DIAGNOSIS — Z79899 Other long term (current) drug therapy: Secondary | ICD-10-CM | POA: Diagnosis not present

## 2023-01-08 DIAGNOSIS — R310 Gross hematuria: Secondary | ICD-10-CM | POA: Diagnosis not present

## 2023-01-08 DIAGNOSIS — Z7901 Long term (current) use of anticoagulants: Secondary | ICD-10-CM | POA: Diagnosis not present

## 2023-01-08 DIAGNOSIS — R319 Hematuria, unspecified: Secondary | ICD-10-CM | POA: Diagnosis present

## 2023-01-08 LAB — COMPREHENSIVE METABOLIC PANEL
ALT: 14 U/L (ref 0–44)
AST: 18 U/L (ref 15–41)
Albumin: 3.8 g/dL (ref 3.5–5.0)
Alkaline Phosphatase: 67 U/L (ref 38–126)
Anion gap: 9 (ref 5–15)
BUN: 14 mg/dL (ref 6–20)
CO2: 23 mmol/L (ref 22–32)
Calcium: 8.4 mg/dL — ABNORMAL LOW (ref 8.9–10.3)
Chloride: 107 mmol/L (ref 98–111)
Creatinine, Ser: 0.92 mg/dL (ref 0.61–1.24)
GFR, Estimated: >60 mL/min (ref >60)
Glucose, Bld: 142 mg/dL — ABNORMAL HIGH (ref 70–99)
Potassium: 4.3 mmol/L (ref 3.5–5.1)
Sodium: 139 mmol/L (ref 135–145)
Total Bilirubin: 0.2 mg/dL — ABNORMAL LOW (ref 0.3–1.2)
Total Protein: 7.2 g/dL (ref 6.5–8.1)

## 2023-01-08 LAB — CBC WITH DIFFERENTIAL/PLATELET
Abs Immature Granulocytes: 0.01 10*3/uL (ref 0.00–0.07)
Basophils Absolute: 0.1 10*3/uL (ref 0.0–0.1)
Basophils Relative: 1 %
Eosinophils Absolute: 0.1 10*3/uL (ref 0.0–0.5)
Eosinophils Relative: 1 %
HCT: 43.8 % (ref 39.0–52.0)
Hemoglobin: 14.7 g/dL (ref 13.0–17.0)
Immature Granulocytes: 0 %
Lymphocytes Relative: 56 %
Lymphs Abs: 3.2 10*3/uL (ref 0.7–4.0)
MCH: 32.3 pg (ref 26.0–34.0)
MCHC: 33.6 g/dL (ref 30.0–36.0)
MCV: 96.3 fL (ref 80.0–100.0)
Monocytes Absolute: 0.4 10*3/uL (ref 0.1–1.0)
Monocytes Relative: 7 %
Neutro Abs: 2 10*3/uL (ref 1.7–7.7)
Neutrophils Relative %: 35 %
Platelets: 182 10*3/uL (ref 150–400)
RBC: 4.55 MIL/uL (ref 4.22–5.81)
RDW: 12.9 % (ref 11.5–15.5)
WBC: 5.7 10*3/uL (ref 4.0–10.5)
nRBC: 0 % (ref 0.0–0.2)

## 2023-01-08 LAB — URINALYSIS, ROUTINE W REFLEX MICROSCOPIC
Bacteria, UA: NONE SEEN
Bilirubin Urine: NEGATIVE
Glucose, UA: NEGATIVE mg/dL
Ketones, ur: NEGATIVE mg/dL
Leukocytes,Ua: NEGATIVE
Nitrite: NEGATIVE
Protein, ur: 30 mg/dL — AB
RBC / HPF: >50 RBC/hpf (ref 0–5)
Specific Gravity, Urine: 1.015 (ref 1.005–1.030)
pH: 6 (ref 5.0–8.0)

## 2023-01-08 MED ORDER — SODIUM CHLORIDE 0.9 % IV BOLUS
500.0000 mL | Freq: Once | INTRAVENOUS | Status: AC
  Administered 2023-01-08: 500 mL via INTRAVENOUS

## 2023-01-08 NOTE — Discharge Instructions (Signed)
Do not take your Eliquis tonight or tomorrow morning.  Start back on your Eliquis tomorrow evening.  Follow-up with your primary care doctor this week for recheck

## 2023-01-08 NOTE — ED Triage Notes (Signed)
Pt endorses hematuria that just started. Pt on eliquis. Endorses nausea. Denies urinary frequency or urgency.

## 2023-01-08 NOTE — ED Provider Notes (Signed)
  [place] EMERGENCY DEPARTMENT AT [facility] Provider Note   CSN: [id number] Arrival date & time: 01/08/23  1628     History {Add pertinent medical, surgical, social history, OB history to HPI:1} Chief Complaint  Patient presents with   Hematuria    Gregory Andrews is a 62 y.o. male.  Patient has a history of DVTs and is on Eliquis.  He started having bloody urine   Hematuria       Home Medications Prior to Admission medications   Medication Sig Start Date End Date Taking? Authorizing Provider  apixaban (ELIQUIS) 5 MG TABS tablet Take 1 tablet (5 mg total) by mouth 2 (two) times daily. PLEASE START ONLY AFTER YOU HAVE COMPLETED THE STARTER PACK 01/09/23  Yes Osvaldo Shipper, MD  lisinopril (ZESTRIL) 10 MG tablet Take 10 mg by mouth every 7 (seven) days.   Yes [provider]      Allergies    Codeine    Review of Systems   Review of Systems  Genitourinary:  Positive for hematuria.    Physical Exam Updated Vital Signs BP (!) 121/91 (BP Location: Right Arm)   Pulse (!) 113   Temp 98 F (36.7 C)   Resp 18   Ht 5\' 10"  (1.778 m)   Wt 83.9 kg   SpO2 93%   BMI 26.54 kg/m  Physical Exam  ED Results / Procedures / Treatments   Labs (all labs ordered are listed, but only abnormal results are displayed) Labs Reviewed  COMPREHENSIVE METABOLIC PANEL - Abnormal; Notable for the following components:      Result Value   Glucose, Bld 142 (*)    Calcium 8.4 (*)    Total Bilirubin 0.2 (*)    All other components within normal limits  URINALYSIS, ROUTINE W REFLEX MICROSCOPIC - Abnormal; Notable for the following components:   Hgb urine dipstick LARGE (*)    Protein, ur 30 (*)    All other components within normal limits  CBC WITH DIFFERENTIAL/PLATELET    EKG None  Radiology No results found.  Procedures Procedures  {Document cardiac monitor, telemetry assessment procedure when appropriate:1}  Medications Ordered in ED Medications   sodium chloride 0.9 % bolus 500 mL (500 mLs Intravenous New Bag/Given 01/08/23 1742)    ED Course/ Medical Decision Making/ A&P   {   Click here for ABCD2, HEART and other calculatorsREFRESH Note before signing :1}                          Medical Decision Making Amount and/or Complexity of Data Reviewed Labs: ordered.   Patient with hematuria.  He will hold his Eliquis for 2 dosages and then follow back up with his PCP  {Document critical care time when appropriate:1} {Document review of labs and clinical decision tools ie heart score, Chads2Vasc2 etc:1}  {Document your independent review of radiology images, and any outside records:1} {Document your discussion with family members, caretakers, and with consultants:1} {Document social determinants of health affecting pt's care:1} {Document your decision making why or why not admission, treatments were needed:1} Final Clinical Impression(s) / ED Diagnoses Final diagnoses:  None    Rx / DC Orders ED Discharge Orders     None

## 2023-02-14 ENCOUNTER — Other Ambulatory Visit: Payer: Self-pay

## 2023-02-14 ENCOUNTER — Emergency Department (HOSPITAL_COMMUNITY)
Admission: EM | Admit: 2023-02-14 | Discharge: 2023-02-14 | Disposition: A | Discharge status: Home / Self Care | Payer: BLUE CROSS/BLUE SHIELD | Attending: Emergency Medicine | Admitting: Emergency Medicine

## 2023-02-14 ENCOUNTER — Encounter (HOSPITAL_COMMUNITY): Payer: Self-pay | Admitting: Emergency Medicine

## 2023-02-14 DIAGNOSIS — Z7901 Long term (current) use of anticoagulants: Secondary | ICD-10-CM | POA: Insufficient documentation

## 2023-02-14 DIAGNOSIS — E119 Type 2 diabetes mellitus without complications: Secondary | ICD-10-CM | POA: Diagnosis not present

## 2023-02-14 DIAGNOSIS — T464X5A Adverse effect of angiotensin-converting-enzyme inhibitors, initial encounter: Secondary | ICD-10-CM | POA: Insufficient documentation

## 2023-02-14 DIAGNOSIS — T783XXA Angioneurotic edema, initial encounter: Secondary | ICD-10-CM | POA: Diagnosis present

## 2023-02-14 DIAGNOSIS — I10 Essential (primary) hypertension: Secondary | ICD-10-CM | POA: Diagnosis not present

## 2023-02-14 MED ORDER — FAMOTIDINE IN NACL 20-0.9 MG/50ML-% IV SOLN
20.0000 mg | Freq: Once | INTRAVENOUS | Status: AC
  Administered 2023-02-14: 20 mg via INTRAVENOUS
  Filled 2023-02-14: qty 50

## 2023-02-14 MED ORDER — METHYLPREDNISOLONE SODIUM SUCC 125 MG IJ SOLR
125.0000 mg | Freq: Once | INTRAMUSCULAR | Status: AC
  Administered 2023-02-14: 125 mg via INTRAVENOUS
  Filled 2023-02-14: qty 2

## 2023-02-14 NOTE — ED Provider Notes (Signed)
[place] EMERGENCY DEPARTMENT AT [facility] Provider Note   CSN: [id number] Arrival date & time: 02/14/23  1517     History  Chief Complaint  Patient presents with   Angioedema    Gregory Andrews is a 62 y.o. male.  HPI Patient presents with swelling of his lips.  Woke up with this morning.  Has worsened somewhat with beginning but more stable since later this morning.  Swelling of lips but does not feel his mouth is involved.  States he has had 2 previous episodes.  Thought to be secondary to lisinopril.  States his doctor decreased it from every day to every other day.   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 Authorizing Provider  apixaban (ELIQUIS) 5 MG TABS tablet Take 1 tablet (5 mg total) by mouth 2 (two) times daily. PLEASE START ONLY AFTER YOU HAVE COMPLETED THE STARTER PACK 01/09/23   Osvaldo Shipper, MD      Allergies    Codeine and Lisinopril    Review of Systems   Review of Systems  Physical Exam Updated Vital Signs BP (!) 133/92 (BP Location: Right Arm)   Pulse 75   Temp 98.2 F (36.8 C) (Oral)   Resp 18   Ht 5\' 10"  (1.778 m)   Wt 81.6 kg   SpO2 99%   BMI 25.83 kg/m  Physical Exam Vitals and nursing note reviewed.  HENT:     Head: Atraumatic.     Mouth/Throat:     Comments: No tongue or posterior pharyngeal edema.  Does have angioedema of lower lip primarily on right side but some left side and mild left-sided upper lip  involvement. Eyes:     Pupils: Pupils are equal, round, and reactive to light.  Cardiovascular:     Rate and Rhythm: Normal rate.  Pulmonary:     Breath sounds: No wheezing.  Abdominal:     Tenderness: There is no abdominal tenderness.  Skin:    General: Skin is warm.     Capillary Refill: Capillary refill takes less than 2 seconds.  Neurological:     Mental Status: He is alert and oriented to person, place, and time.     ED Results / Procedures / Treatments   Labs (all labs ordered are listed, but only abnormal results are displayed) Labs Reviewed - No data to display  EKG None  Radiology No results found.  Procedures Procedures    Medications Ordered in ED Medications  methylPREDNISolone sodium succinate (SOLU-MEDROL) 125 mg/2 mL injection 125 mg (125 mg Intravenous Given 02/14/23 1556)  famotidine (PEPCID) IVPB 20 mg premix (0 mg Intravenous Stopped 02/14/23 1825)    ED Course/ Medical Decision Making/ A&P                             Medical Decision Making Risk Prescription drug management.   Patient with angioedema.  2  previous episodes of same.  Differential diagnosis angioedema various cause.  No family history.  Is on lisinopril.  States PCP Previous episodes of the angioedema so they take the lisinopril to every other day.  Has been stable since earlier this morning.  Will continue to monitor.  Has been given steroids and Benadryl.  Will give Pepcid.  Has been stable for the around 4 hours of the stay.  Still some swelling to the lips but no deeper involvement.  Will discharge home with outpatient follow-up.  Will stop lisinopril.        Final Clinical Impression(s) / ED Diagnoses Final diagnoses:  Angioedema due to angiotensin converting enzyme inhibitor (ACE-I)    Rx / DC Orders ED Discharge Orders     None         Benjiman Core, MD 02/14/23 2357

## 2023-02-14 NOTE — Discharge Instructions (Signed)
Return for worsening symptoms.  Follow-up with Dr. Anette Riedel for adjustment of your medications.

## 2023-02-14 NOTE — ED Notes (Signed)
Warm blanket given. Patient declined anything to drink

## 2023-02-14 NOTE — ED Triage Notes (Signed)
Pt presents with significant angioedema; takes lisinopril and has had facial swelling before but not to this extent. Pt took benadryl PTA but swelling has continued to progress. Denies pressure, tightness, itching, swelling inside mouth and throat. Airway is patent and pt has no difficulty breathing.

## 2023-06-19 ENCOUNTER — Other Ambulatory Visit: Payer: Self-pay

## 2023-06-19 ENCOUNTER — Emergency Department (HOSPITAL_COMMUNITY): Payer: BLUE CROSS/BLUE SHIELD

## 2023-06-19 ENCOUNTER — Emergency Department (HOSPITAL_COMMUNITY)
Admission: EM | Admit: 2023-06-19 | Discharge: 2023-06-19 | Disposition: A | Discharge status: Home / Self Care | Payer: BLUE CROSS/BLUE SHIELD | Attending: Emergency Medicine | Admitting: Emergency Medicine

## 2023-06-19 ENCOUNTER — Encounter (HOSPITAL_COMMUNITY): Payer: Self-pay

## 2023-06-19 DIAGNOSIS — R0602 Shortness of breath: Secondary | ICD-10-CM | POA: Diagnosis present

## 2023-06-19 DIAGNOSIS — Z20822 Contact with and (suspected) exposure to covid-19: Secondary | ICD-10-CM | POA: Insufficient documentation

## 2023-06-19 DIAGNOSIS — F17200 Nicotine dependence, unspecified, uncomplicated: Secondary | ICD-10-CM | POA: Insufficient documentation

## 2023-06-19 DIAGNOSIS — J209 Acute bronchitis, unspecified: Secondary | ICD-10-CM | POA: Insufficient documentation

## 2023-06-19 DIAGNOSIS — Z7901 Long term (current) use of anticoagulants: Secondary | ICD-10-CM | POA: Diagnosis not present

## 2023-06-19 DIAGNOSIS — R000 Tachycardia, unspecified: Secondary | ICD-10-CM | POA: Diagnosis not present

## 2023-06-19 LAB — CBC WITH DIFFERENTIAL/PLATELET
Abs Immature Granulocytes: 0.01 10*3/uL (ref 0.00–0.07)
Basophils Absolute: 0 10*3/uL (ref 0.0–0.1)
Basophils Relative: 1 %
Eosinophils Absolute: 0.1 10*3/uL (ref 0.0–0.5)
Eosinophils Relative: 2 %
HCT: 42.6 % (ref 39.0–52.0)
Hemoglobin: 14.8 g/dL (ref 13.0–17.0)
Immature Granulocytes: 0 %
Lymphocytes Relative: 33 %
Lymphs Abs: 1.9 10*3/uL (ref 0.7–4.0)
MCH: 32.6 pg (ref 26.0–34.0)
MCHC: 34.7 g/dL (ref 30.0–36.0)
MCV: 93.8 fL (ref 80.0–100.0)
Monocytes Absolute: 0.7 10*3/uL (ref 0.1–1.0)
Monocytes Relative: 11 %
Neutro Abs: 3.1 10*3/uL (ref 1.7–7.7)
Neutrophils Relative %: 53 %
Platelets: 214 10*3/uL (ref 150–400)
RBC: 4.54 MIL/uL (ref 4.22–5.81)
RDW: 12.9 % (ref 11.5–15.5)
WBC: 5.8 10*3/uL (ref 4.0–10.5)
nRBC: 0 % (ref 0.0–0.2)

## 2023-06-19 LAB — COMPREHENSIVE METABOLIC PANEL
ALT: 17 U/L (ref 0–44)
AST: 19 U/L (ref 15–41)
Albumin: 3.8 g/dL (ref 3.5–5.0)
Alkaline Phosphatase: 62 U/L (ref 38–126)
Anion gap: 11 (ref 5–15)
BUN: 11 mg/dL (ref 6–20)
CO2: 25 mmol/L (ref 22–32)
Calcium: 9 mg/dL (ref 8.9–10.3)
Chloride: 100 mmol/L (ref 98–111)
Creatinine, Ser: 1.07 mg/dL (ref 0.61–1.24)
GFR, Estimated: >60 mL/min (ref >60)
Glucose, Bld: 196 mg/dL — ABNORMAL HIGH (ref 70–99)
Potassium: 3.4 mmol/L — ABNORMAL LOW (ref 3.5–5.1)
Sodium: 136 mmol/L (ref 135–145)
Total Bilirubin: 0.8 mg/dL (ref 0.3–1.2)
Total Protein: 7.3 g/dL (ref 6.5–8.1)

## 2023-06-19 LAB — RESP PANEL BY RT-PCR (RSV, FLU A&B, COVID)  RVPGX2
Influenza A by PCR: NEGATIVE
Influenza B by PCR: NEGATIVE
Resp Syncytial Virus by PCR: NEGATIVE
SARS Coronavirus 2 by RT PCR: NEGATIVE

## 2023-06-19 LAB — TROPONIN I (HIGH SENSITIVITY): Troponin I (High Sensitivity): 4 ng/L (ref <18)

## 2023-06-19 MED ORDER — IPRATROPIUM-ALBUTEROL 0.5-2.5 (3) MG/3ML IN SOLN
3.0000 mL | Freq: Once | RESPIRATORY_TRACT | Status: AC
  Administered 2023-06-19: 3 mL via RESPIRATORY_TRACT
  Filled 2023-06-19: qty 3

## 2023-06-19 MED ORDER — ALBUTEROL SULFATE (2.5 MG/3ML) 0.083% IN NEBU
INHALATION_SOLUTION | RESPIRATORY_TRACT | Status: AC
  Administered 2023-06-19: 2.5 mg
  Filled 2023-06-19: qty 3

## 2023-06-19 MED ORDER — POTASSIUM CHLORIDE CRYS ER 20 MEQ PO TBCR
40.0000 meq | EXTENDED_RELEASE_TABLET | Freq: Once | ORAL | Status: AC
  Administered 2023-06-19: 40 meq via ORAL
  Filled 2023-06-19: qty 2

## 2023-06-19 MED ORDER — ALBUTEROL SULFATE HFA 108 (90 BASE) MCG/ACT IN AERS
2.0000 | INHALATION_SPRAY | Freq: Once | RESPIRATORY_TRACT | Status: AC
  Administered 2023-06-19: 2 via RESPIRATORY_TRACT
  Filled 2023-06-19: qty 6.7

## 2023-06-19 MED ORDER — SODIUM CHLORIDE 0.9 % IV BOLUS
1000.0000 mL | Freq: Once | INTRAVENOUS | Status: AC
  Administered 2023-06-19: 1000 mL via INTRAVENOUS

## 2023-06-19 NOTE — ED Triage Notes (Addendum)
C/o sudden onset of SOB, weakness, and was diaphoretic at work.  Pt reports seen at [facility] on Thursday for cough, headache, chills, and sneezing and prescribed amoxicillin and was doing good until today  Denies cp.  Pt is on eliquis for hx of PE

## 2023-06-19 NOTE — Discharge Instructions (Signed)
It is important to stop smoking.  We are treating you for a lung infection called bronchitis.  This is almost always by viruses.  Be sure to drink an appropriate amount of fluids and take ibuprofen and/or Tylenol for fevers or aching.  Use the albuterol inhaler, 1-2 puffs, for cough or wheezing or shortness of breath every 4-6 hours.  Call your primary care physician.  If you develop coughing up blood, new or worsening shortness of breath, chest pain, or any other new/concerning symptoms then return to the ER or call 911.

## 2023-06-19 NOTE — ED Provider Notes (Signed)
[place] EMERGENCY DEPARTMENT AT [facility] Provider Note   CSN: [id number] Arrival date & time: 06/19/23  1813     History  Chief Complaint  Patient presents with   Shortness of Breath    Gregory Andrews is a 62 y.o. male.  HPI 62 year old male presents with shortness of breath and chest tightness.  He has been feeling ill for about 5 days.  Went to urgent care where he had a televisit and was swabbed for COVID and started on amoxicillin for a possible pneumonia.  He has been having intermittent episodes where he will all of a sudden feel weak, diaphoretic, short of breath, and have chest tightness.  He has a productive cough of yellow sputum.  He feels that he is slightly improving but had an episode tonight that concerned him and he came in.  Right now he is feeling better.  He has a history of smoking but denies any lung history such as asthma or COPD.  He has a history of PE and has been compliant with Eliquis.  He states when he is had PEs before it was way different and he had a sharp pain rather than the tightness.  Home Medications Prior to Admission medications   Medication Sig Start Date End Date Taking? Authorizing Provider  apixaban (ELIQUIS) 5 MG TABS tablet Take 1 tablet (5 mg total) by mouth 2 (two) times daily. PLEASE START ONLY AFTER YOU HAVE COMPLETED THE STARTER PACK 01/09/23   Osvaldo Shipper, MD      Allergies    Codeine and Lisinopril    Review of Systems   Review of Systems  Constitutional:  Positive for diaphoresis.  Respiratory:  Positive for cough, chest tightness and shortness of breath.   Cardiovascular:  Negative for chest pain and leg swelling.  Neurological:  Positive for weakness.    Physical Exam Updated Vital Signs BP 136/89   Pulse 92   Temp 99 F (37.2 C) (Oral)   Resp 17   Wt 83.9 kg   SpO2 96%   BMI 26.54 kg/m  Physical Exam Vitals and nursing note reviewed.  Constitutional:      General: He is not in acute  distress.    Appearance: He is well-developed. He is not ill-appearing or diaphoretic.  HENT:     Head: Normocephalic and atraumatic.  Cardiovascular:     Rate and Rhythm: Regular rhythm. Tachycardia present.     Heart sounds: Normal heart sounds.     Comments: HR low 100s Pulmonary:     Effort: Pulmonary effort is normal.     Breath sounds: Wheezing (diffuse, expiratory) present.  Abdominal:     General: There is no distension.  Musculoskeletal:     Right lower leg: No edema.     Left lower leg: No edema.  Skin:    General: Skin is warm and dry.  Neurological:     Mental Status: He is alert.     ED Results / Procedures / Treatments   Labs (all labs ordered are listed, but only abnormal results are displayed) Labs Reviewed  COMPREHENSIVE METABOLIC PANEL - Abnormal; Notable for the following components:      Result Value   Potassium 3.4 (*)    Glucose, Bld 196 (*)    All other components within normal limits  RESP PANEL BY RT-PCR (RSV, FLU A&B, COVID)  [id number]  CBC WITH DIFFERENTIAL/PLATELET  TROPONIN I (HIGH SENSITIVITY)    EKG EKG Interpretation Date/Time:  Sunday June 19 2023 18:29:44 EDT Ventricular Rate:  114 PR Interval:  164 QRS Duration:  128 QT Interval:  340 QTC Calculation: 468 R Axis:   121  Text Interpretation: Sinus tachycardia Right atrial enlargement Right bundle branch block Left posterior fascicular block Bifascicular block  rate is faster, otherwise unchanged ECG compared to Mar 2024 Confirmed by Pricilla Loveless [phone]) on 06/19/2023 7:44:40 PM  Radiology DG Chest 2 View  Result Date: 06/19/2023 CLINICAL DATA:  Shortness of breath and some central chest pain/tightness EXAM: CHEST - 2 VIEW COMPARISON:  Radiograph and CT 12/07/2022 FINDINGS: Elevated left hemidiaphragm and left basilar atelectasis. The lungs are otherwise clear. Stable cardiomediastinal silhouette. Aortic atherosclerotic calcification. No displaced rib fractures. IMPRESSION: No  active cardiopulmonary disease. Electronically Signed   By: Minerva Fester M.D.   On: 06/19/2023 19:57    Procedures Procedures    Medications Ordered in ED Medications  ipratropium-albuterol (DUONEB) 0.5-2.5 (3) MG/3ML nebulizer solution 3 mL (3 mLs Nebulization Given 06/19/23 2028)  sodium chloride 0.9 % bolus 1,000 mL (0 mLs Intravenous Stopped 06/19/23 2055)  potassium chloride SA (KLOR-CON M) CR tablet 40 mEq (40 mEq Oral Given 06/19/23 2023)  albuterol (PROVENTIL) (2.5 MG/3ML) 0.083% nebulizer solution (2.5 mg  Given 06/19/23 2032)  albuterol (VENTOLIN HFA) 108 (90 Base) MCG/ACT inhaler 2 puff (2 puffs Inhalation Provided for home use 06/19/23 2105)    ED Course/ Medical Decision Making/ A&P                                 Medical Decision Making Amount and/or Complexity of Data Reviewed Labs: ordered.    Details: Normal WBC.  Mild hypokalemia.  Normal troponin. Radiology: ordered and independent interpretation performed.    Details: No pneumonia ECG/medicine tests: ordered and independent interpretation performed.    Details: No ischemia  Risk Prescription drug management.   Given his wheezing and respiratory symptoms this is consistent with bronchitis.  He feels a lot better after fluids and albuterol.  Will give an albuterol inhaler.  He does not carry an asthma/COPD diagnosis but does smoke.  I do not think steroids would be beneficial given the no known lung disease.  No signs of this is bacterial and he is already been on amoxicillin.  He does have a PE history but this feels very different than when he had PEs and this sounds more respiratory infection.  Given his clinical improvement it is reasonable to discharge.  I did discuss that while his chest tightness was atypical, we can consider getting a second troponin based on time of onset but he would rather just go home.  I think ACS is unlikely.  Will discharge home with return precautions and PCP  follow-up.        Final Clinical Impression(s) / ED Diagnoses Final diagnoses:  Acute bronchitis, unspecified organism    Rx / DC Orders ED Discharge Orders     None         Pricilla Loveless, MD 06/19/23 2339

## 2023-06-19 NOTE — ED Notes (Signed)
ED Provider at bedside. 

## 2024-01-03 IMAGING — CT CT RENAL STONE PROTOCOL
2 of 4 series · 16 of 46 positions shown, 18 images · non-contrast
Comparison: CT abdomen pelvis dated December 02, 2020.

CLINICAL DATA: Right flank pain and hematuria.



[Series 2: axial st · axial · 0.75mm/px · z∈[+956,+1336]mm · 13 of 86 slices shown, 15 images]
[im 5/86  soft-tissue]
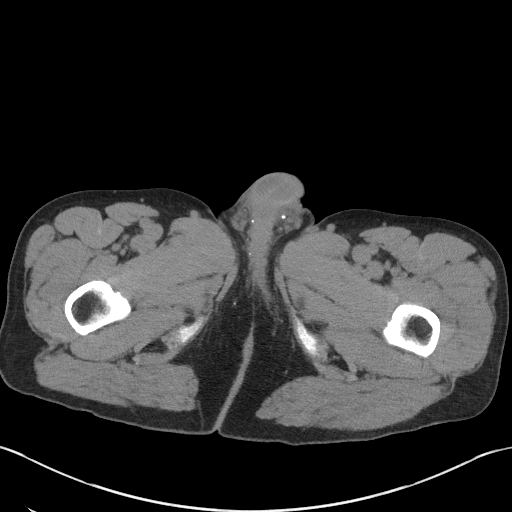
[im 5/86  bone]
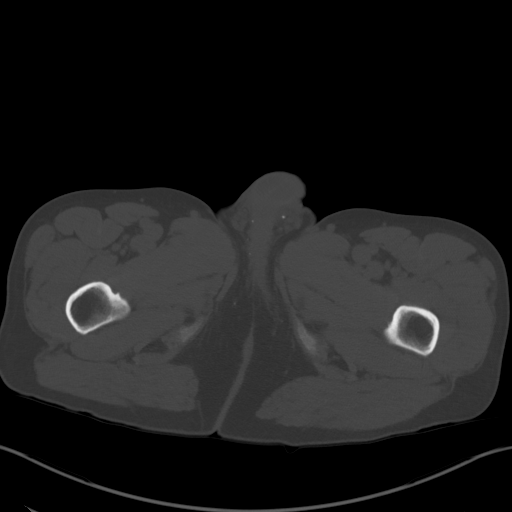
[im 13/86  soft-tissue]
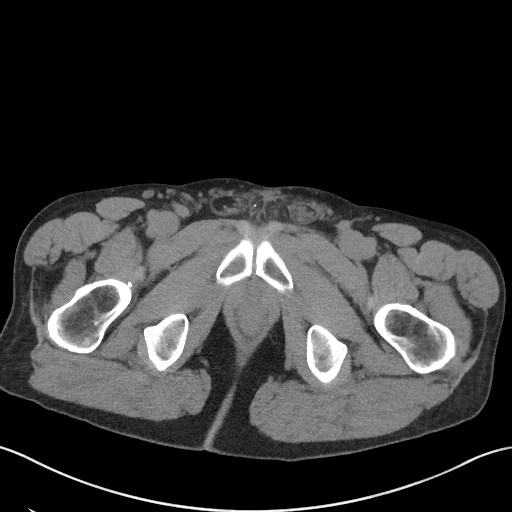
[im 18/86  soft-tissue]
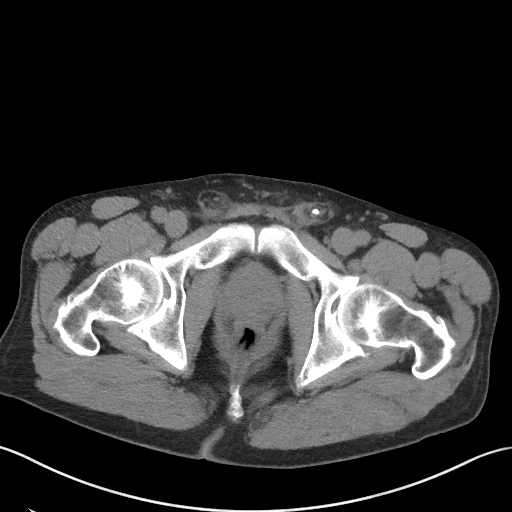
[im 26/86  soft-tissue]
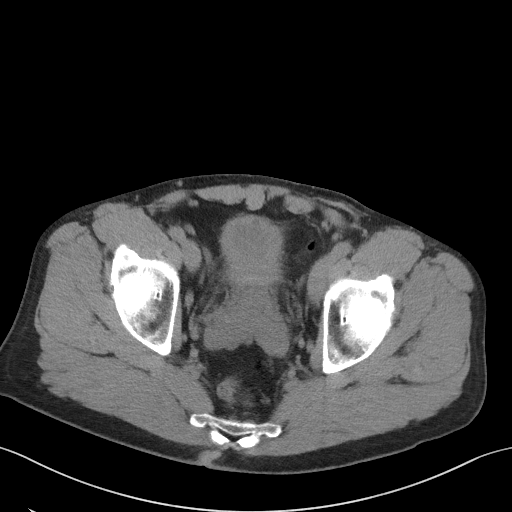
[im 30/86  soft-tissue]
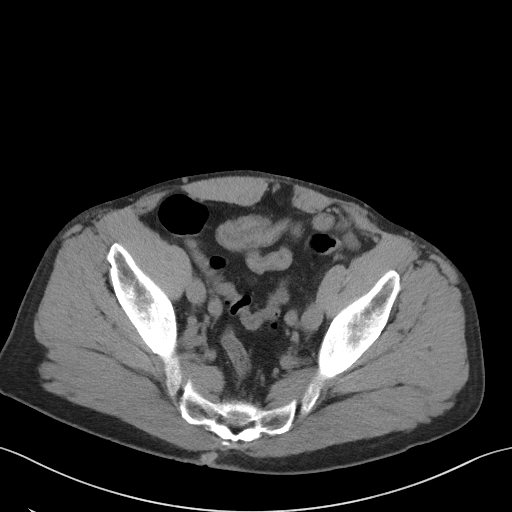
[im 39/86  soft-tissue]
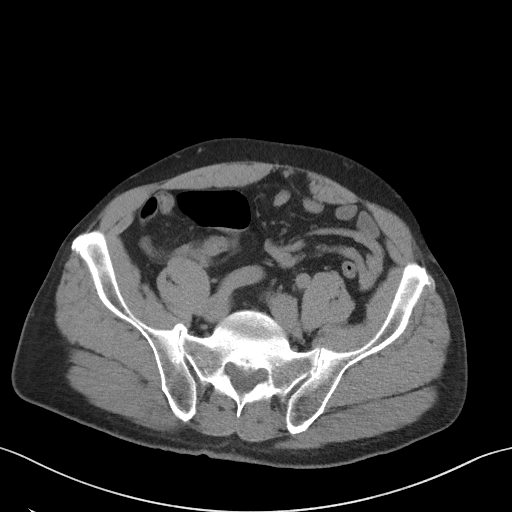
[im 43/86  soft-tissue]
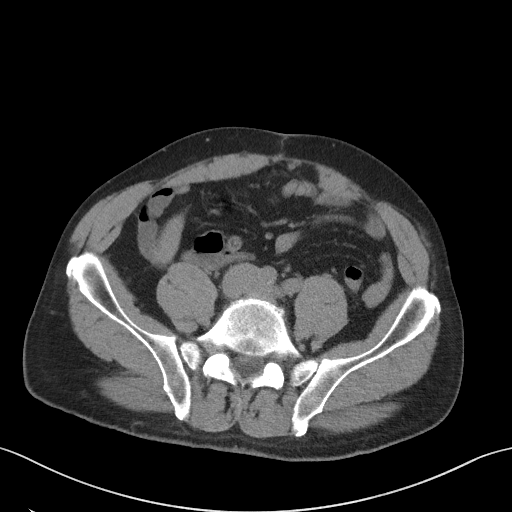
[im 47/86  soft-tissue]
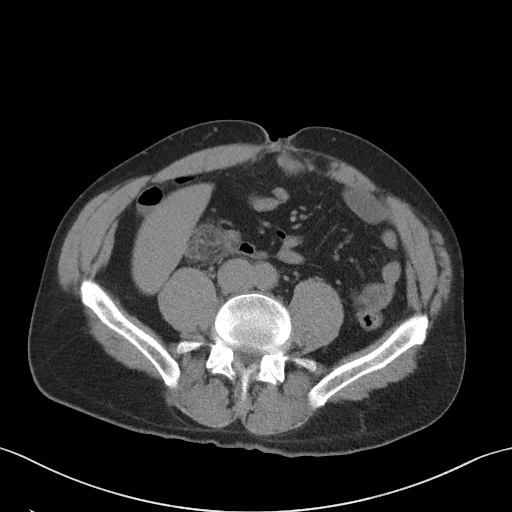
[im 56/86  soft-tissue]
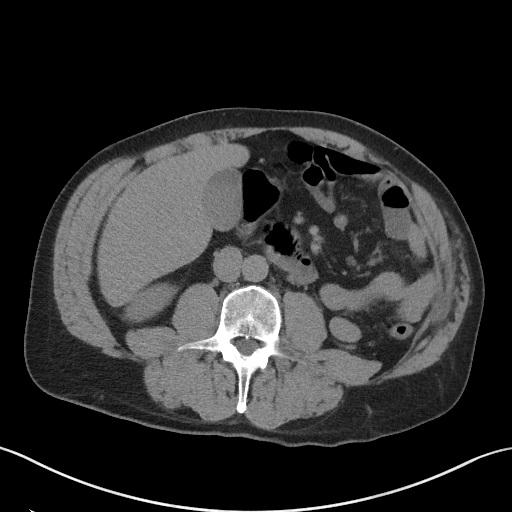
[im 56/86  bone]
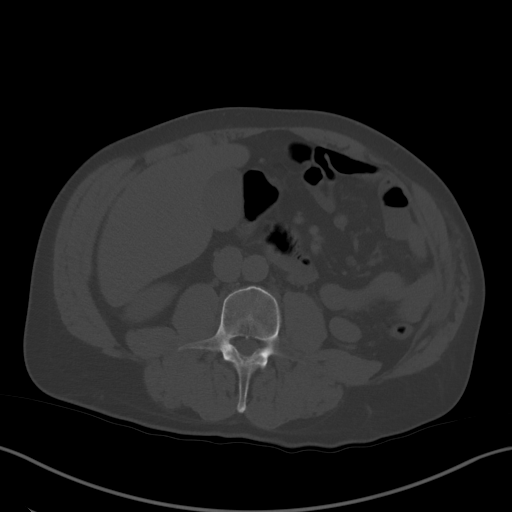
[im 60/86  soft-tissue]
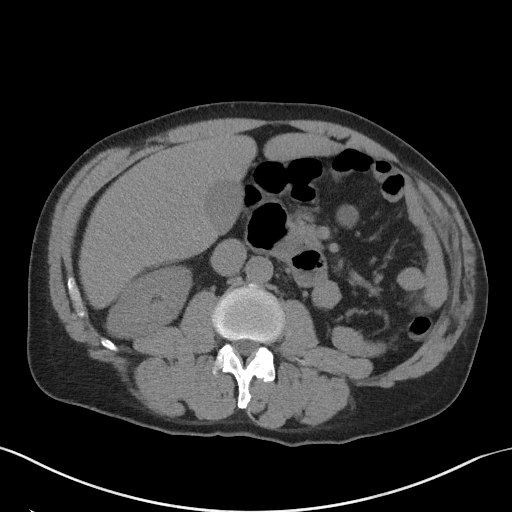
[im 69/86  soft-tissue]
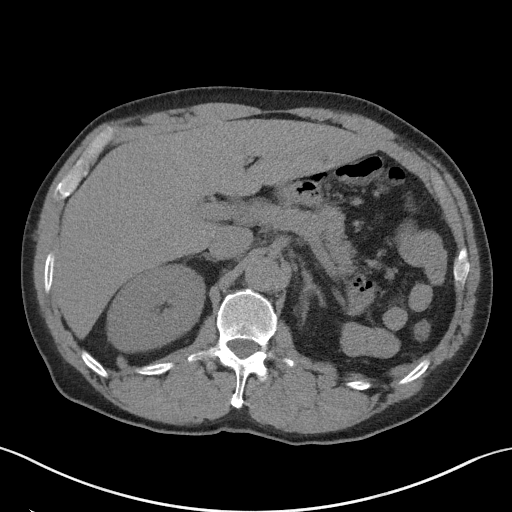
[im 73/86  soft-tissue]
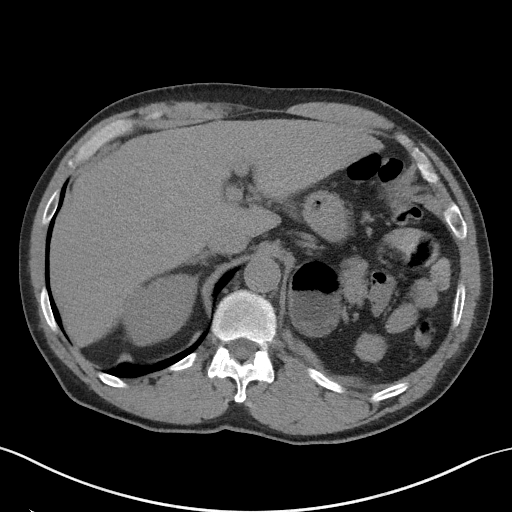
[im 81/86  soft-tissue]
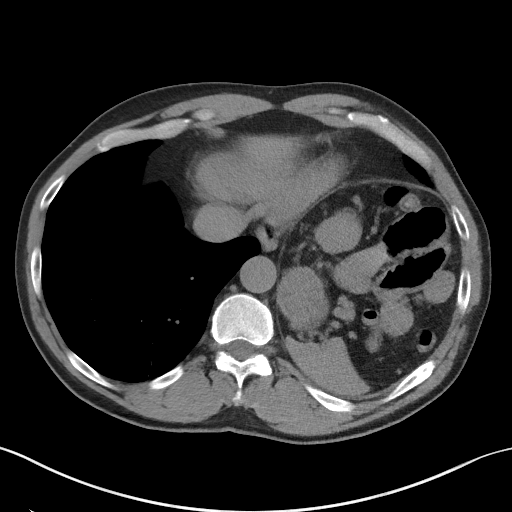

[Series 5: coronal st · coronal · 0.72mm/px · 3 of 104 slices shown]
[im 35/104  soft-tissue]
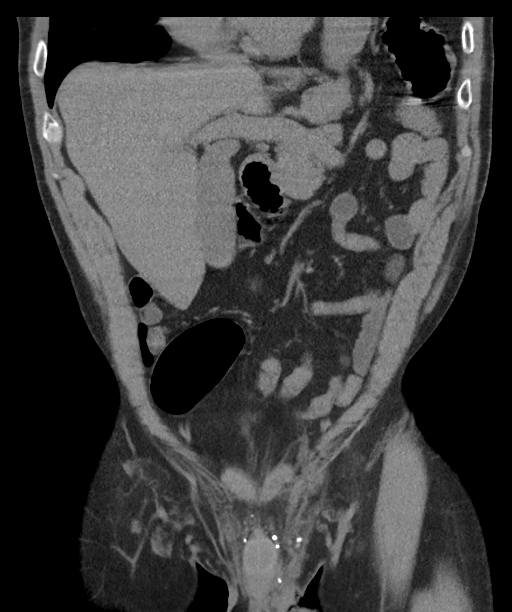
[im 46/104  soft-tissue]
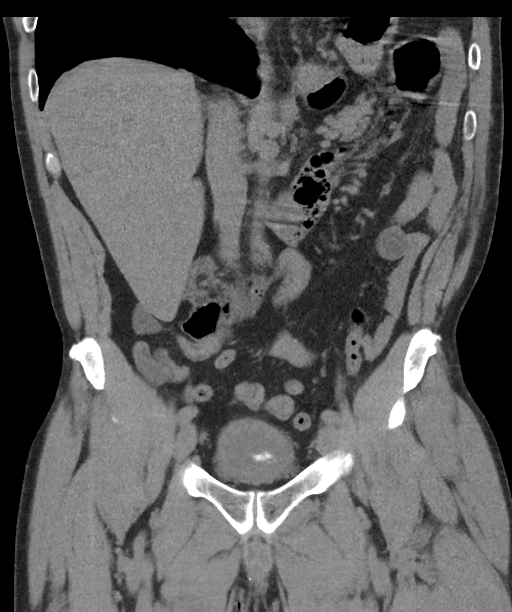
[im 58/104  soft-tissue]
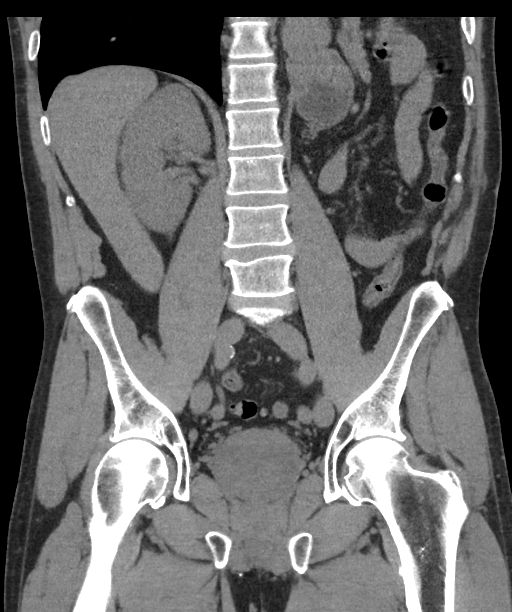

[16 of 46 positions shown; findings below may reference images not displayed]

FINDINGS: Lower chest: No acute abnormality. Unchanged marked elevation of the
left hemidiaphragm.

Hepatobiliary: Unchanged simple cyst in the left liver. No new focal
liver abnormality. Unchanged layering sludge within the gallbladder.
No gallbladder wall thickening or biliary dilatation.

Pancreas: Unremarkable. No pancreatic ductal dilatation or
surrounding inflammatory changes.

Spleen: Normal in size without focal abnormality.

Adrenals/Urinary Tract: Adrenal glands are unremarkable. Prior left
nephrectomy. No renal calculi or hydronephrosis. New layering
calcification in the bladder, likely multiple tiny calculi.

Stomach/Bowel: Stomach is within normal limits. Appendix appears
normal. No evidence of bowel wall thickening, distention, or
inflammatory changes.

Vascular/Lymphatic: Aortic atherosclerosis. No enlarged abdominal or
pelvic lymph nodes.

Reproductive: Unchanged moderate prostatomegaly.

Other: Prior left inguinal hernia repair. No free fluid or
pneumoperitoneum.

Musculoskeletal: No new acute or significant osseous finding.
Chronic bilateral femoral head avascular necrosis again noted.
IMPRESSION: 1. No nephrolithiasis or hydronephrosis.
2. New multiple layering tiny calculi in the bladder.
3. Prior left nephrectomy.
4. Unchanged severe elevation of the left hemidiaphragm.
5. Aortic Atherosclerosis ([id number]).

## 2024-03-19 ENCOUNTER — Emergency Department (HOSPITAL_COMMUNITY)

## 2024-03-19 ENCOUNTER — Emergency Department (HOSPITAL_COMMUNITY)
Admission: EM | Admit: 2024-03-19 | Discharge: 2024-03-19 | Discharge status: Against Medical Advice | Attending: Emergency Medicine | Admitting: Emergency Medicine

## 2024-03-19 ENCOUNTER — Other Ambulatory Visit: Payer: Self-pay

## 2024-03-19 ENCOUNTER — Encounter (HOSPITAL_COMMUNITY): Payer: Self-pay

## 2024-03-19 DIAGNOSIS — E119 Type 2 diabetes mellitus without complications: Secondary | ICD-10-CM | POA: Insufficient documentation

## 2024-03-19 DIAGNOSIS — M79661 Pain in right lower leg: Secondary | ICD-10-CM | POA: Insufficient documentation

## 2024-03-19 DIAGNOSIS — Z7901 Long term (current) use of anticoagulants: Secondary | ICD-10-CM | POA: Diagnosis not present

## 2024-03-19 DIAGNOSIS — M79651 Pain in right thigh: Secondary | ICD-10-CM | POA: Insufficient documentation

## 2024-03-19 DIAGNOSIS — M79604 Pain in right leg: Secondary | ICD-10-CM

## 2024-03-19 DIAGNOSIS — I10 Essential (primary) hypertension: Secondary | ICD-10-CM | POA: Diagnosis not present

## 2024-03-19 NOTE — ED Provider Notes (Signed)
 [place] EMERGENCY DEPARTMENT AT [facility] Provider Note   CSN: [id number] Arrival date & time: 03/19/24  1026     Patient presents with: Leg Pain   Gregory Andrews is a 63 y.o. male.  He has a history of diabetes hypertension DVT and PE.  He has been on Eliquis  and has been compliant.  He said he woke up this morning with pain behind his right knee and had his right posterior thigh.  No known trauma.  No chest pain or shortness of breath.  No numbness or weakness.  No pain at rest but significant pain with movement.   The history is provided by the patient.  Leg Pain Location:  Knee and leg Time since incident:  3 hours Injury: no   Leg location:  R upper leg Knee location:  R knee Pain details:    Quality:  Aching   Severity:  Moderate   Duration:  1 day   Progression:  Unchanged Chronicity:  New Relieved by:  None tried Worsened by:  Bearing weight and activity Associated symptoms: no fever, no muscle weakness, no numbness, no swelling and no tingling        Prior to Admission medications   Medication Sig Start Date End Date Taking? Authorizing Provider  glimepiride  (AMARYL ) 2 MG tablet Take 2 mg by mouth daily with breakfast.   Yes [provider]  apixaban  (ELIQUIS ) 5 MG TABS tablet Take 1 tablet (5 mg total) by mouth 2 (two) times daily. PLEASE START ONLY AFTER YOU HAVE COMPLETED THE STARTER PACK 01/09/23   Krishnan, Gokul, MD    Allergies: Codeine and Lisinopril     Review of Systems  Constitutional:  Negative for fever.    Updated Vital Signs BP 121/83 (BP Location: Right Arm)   Pulse (!) 102   Temp 98.3 F (36.8 C)   Resp 19   Ht 5' 10 (1.778 m)   Wt 81.6 kg   SpO2 96%   BMI 25.83 kg/m   Physical Exam Vitals and nursing note reviewed.  Constitutional:      Appearance: Normal appearance. He is well-developed.  HENT:     Head: Normocephalic and atraumatic.  Eyes:     Conjunctiva/sclera: Conjunctivae normal.   Cardiovascular:     Rate and Rhythm: Normal rate and regular rhythm.     Heart sounds: No murmur heard. Pulmonary:     Effort: Pulmonary effort is normal. No respiratory distress.     Breath sounds: Normal breath sounds.  Abdominal:     Palpations: Abdomen is soft.     Tenderness: There is no abdominal tenderness.  Musculoskeletal:        General: Tenderness present. No deformity.     Cervical back: Neck supple.     Right lower leg: No edema.     Left lower leg: No edema.     Comments: Right lower extremities he has good intact peripheral pulses.  He has some mild tenderness popliteal and distal posterior thigh.  No significant knee effusion or knee tenderness.  Skin:    General: Skin is warm and dry.  Neurological:     General: No focal deficit present.     Mental Status: He is alert.     GCS: GCS eye subscore is 4. GCS verbal subscore is 5. GCS motor subscore is 6.     Sensory: No sensory deficit.     Motor: No weakness.     (all labs ordered are listed, but  only abnormal results are displayed) Labs Reviewed - No data to display  EKG: None  Radiology: US  Venous Img Lower Right (DVT Study) Result Date: 03/19/2024 CLINICAL DATA:  posterior leg pain EXAM: RIGHT LOWER EXTREMITY VENOUS DOPPLER ULTRASOUND TECHNIQUE: Gray-scale sonography with graded compression, as well as color Doppler and duplex ultrasound were performed to evaluate the lower extremity deep venous systems from the level of the common femoral vein and including the common femoral, femoral, profunda femoral, popliteal and calf veins including the posterior tibial, peroneal and gastrocnemius veins when visible. The superficial great saphenous vein was also interrogated. Spectral Doppler was utilized to evaluate flow at rest and with distal augmentation maneuvers in the common femoral, femoral and popliteal veins. COMPARISON:  December 08, 2022 FINDINGS: Contralateral Common Femoral Vein: Respiratory phasicity is normal and  symmetric with the symptomatic side. No evidence of thrombus. Normal compressibility. Common Femoral Vein: No evidence of thrombus. Normal compressibility, respiratory phasicity and response to augmentation. Saphenofemoral Junction: No evidence of thrombus. Normal compressibility and flow on color Doppler imaging. Profunda Femoral Vein: No evidence of thrombus. Normal compressibility and flow on color Doppler imaging. Femoral Vein: No evidence of thrombus. Normal compressibility, respiratory phasicity and response to augmentation. Popliteal Vein: No evidence of thrombus. Normal compressibility, respiratory phasicity and response to augmentation. Calf Veins: Noncompressible gastrocnemius vein. Minimal vascular Doppler flow present. The posterior tibial and peroneal veins are patent and not thrombosed. Superficial Great Saphenous Vein: No evidence of thrombus. Normal compressibility. Other Findings:  None. 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 Electronically Signed   By: Rogelia Myers M.D.   On: 03/19/2024 12:05     Procedures   Medications Ordered in the ED - No data to display  Clinical Course as of 03/19/24 1703  Mon Mar 19, 2024  1225 Reviewed results of duplex with patient.  He said he has already spoken to his vascular doctor [place].  I recommended he continue his anticoagulation and follow-up.  He is comfortable plan for discharge [MB]  1455 I reviewed case with Dr. Leigh patient's primary care doctor.  He said to continue the patient on Lovenox  and he will follow-up in the office.  Patient unfortunately left the department prior to relaying this information to him. [MB]    Clinical Course User Index [MB] Towana Ozell BROCKS, MD                                 Medical Decision Making  This patient complains of right leg pain; this involves an extensive number of treatment Options and is a complaint that carries with it a high risk of complications  and morbidity. The differential includes musculoskeletal pain, arterial occlusion, vascular occlusion, Baker's cyst I ordered imaging studies which included venous duplex and I independently    visualized and interpreted imaging which showed nonocclusive clot in gastrocnemius vein Previous records obtained and reviewed in epic including prior ED visits I consulted Dr. Leigh PCP and discussed lab and imaging findings and discussed disposition.  Cardiac monitoring reviewed, sinus rhythm Social determinants considered, no significant barriers Critical Interventions: None  After the interventions stated above, I reevaluated the patient and found patient to be very upset that he is not being treated with another medication.  He does not want to stop his Eliquis  but he wants me to do something about his blood clot.  I tried to explain that it would need to be  followed by repeat ultrasounds to make sure it is not progressing as that would indicate a failure of his anticoagulation.  I offered to confer with his primary care doctor and did so but by the time I got back to talk with the patient he had eloped Admission and further testing considered, no indications for admission.  Hopefully he will continue his anticoagulation and follow-up with his PCP to continue to manage this.      Final diagnoses:  Acute leg pain, right    ED Discharge Orders     None          Towana Ozell BROCKS, MD 03/19/24 [phone]

## 2024-03-19 NOTE — Discharge Instructions (Signed)
 You were seen in the emergency department for right posterior leg pain.  You had an ultrasound of your leg that showed a nonobstructing clot in the right leg below the level of the knee.  Please continue your anticoagulation and follow-up with your vascular team.  Included below is the report of the ultrasound.  Narrative  CLINICAL DATA:  posterior leg pain    EXAM:  RIGHT LOWER EXTREMITY VENOUS DOPPLER ULTRASOUND    TECHNIQUE:  Gray-scale sonography with graded compression, as well as color  Doppler and duplex ultrasound were performed to evaluate the lower  extremity deep venous systems from the level of the common femoral  vein and including the common femoral, femoral, profunda femoral,  popliteal and calf veins including the posterior tibial, peroneal  and gastrocnemius veins when visible. The superficial great  saphenous vein was also interrogated. Spectral Doppler was utilized  to evaluate flow at rest and with distal augmentation maneuvers in  the common femoral, femoral and popliteal veins.    COMPARISON:  December 08, 2022    FINDINGS:  Contralateral Common Femoral Vein: Respiratory phasicity is normal  and symmetric with the symptomatic side. No evidence of thrombus.  Normal compressibility.    Common Femoral Vein: No evidence of thrombus. Normal  compressibility, respiratory phasicity and response to augmentation.    Saphenofemoral Junction: No evidence of thrombus. Normal  compressibility and flow on color Doppler imaging.    Profunda Femoral Vein: No evidence of thrombus. Normal  compressibility and flow on color Doppler imaging.    Femoral Vein: No evidence of thrombus. Normal compressibility,  respiratory phasicity and response to augmentation.    Popliteal Vein: No evidence of thrombus. Normal compressibility,  respiratory phasicity and response to augmentation.    Calf Veins: Noncompressible gastrocnemius vein. Minimal vascular  Doppler flow present. The  posterior tibial and peroneal veins are  patent and not thrombosed.    Superficial Great Saphenous Vein: No evidence of thrombus. Normal  compressibility.    Other Findings:  None.    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

## 2024-03-19 NOTE — ED Triage Notes (Signed)
 Pt arrived via POV c/o posterior right leg pain. Pt reports taking blood thinners and reports Hx of DVT.

## 2024-03-25 ENCOUNTER — Emergency Department (HOSPITAL_COMMUNITY)
Admission: EM | Admit: 2024-03-25 | Discharge: 2024-03-25 | Disposition: A | Discharge status: Home / Self Care | Attending: Emergency Medicine | Admitting: Emergency Medicine

## 2024-03-25 ENCOUNTER — Emergency Department (HOSPITAL_COMMUNITY)

## 2024-03-25 ENCOUNTER — Encounter (HOSPITAL_COMMUNITY): Payer: Self-pay | Admitting: Emergency Medicine

## 2024-03-25 ENCOUNTER — Other Ambulatory Visit: Payer: Self-pay

## 2024-03-25 DIAGNOSIS — Z7984 Long term (current) use of oral hypoglycemic drugs: Secondary | ICD-10-CM | POA: Diagnosis not present

## 2024-03-25 DIAGNOSIS — E871 Hypo-osmolality and hyponatremia: Secondary | ICD-10-CM | POA: Diagnosis not present

## 2024-03-25 DIAGNOSIS — R000 Tachycardia, unspecified: Secondary | ICD-10-CM | POA: Insufficient documentation

## 2024-03-25 DIAGNOSIS — Z7901 Long term (current) use of anticoagulants: Secondary | ICD-10-CM | POA: Insufficient documentation

## 2024-03-25 DIAGNOSIS — E1165 Type 2 diabetes mellitus with hyperglycemia: Secondary | ICD-10-CM | POA: Diagnosis not present

## 2024-03-25 DIAGNOSIS — R0602 Shortness of breath: Secondary | ICD-10-CM | POA: Diagnosis not present

## 2024-03-25 DIAGNOSIS — M25461 Effusion, right knee: Secondary | ICD-10-CM | POA: Diagnosis present

## 2024-03-25 LAB — TROPONIN I (HIGH SENSITIVITY)
Troponin I (High Sensitivity): 6 ng/L (ref <18)
Troponin I (High Sensitivity): 6 ng/L (ref <18)
Troponin I (High Sensitivity): 7 ng/L (ref <18)
Troponin I (High Sensitivity): 7 ng/L (ref <18)

## 2024-03-25 LAB — SYNOVIAL CELL COUNT + DIFF, W/ CRYSTALS
Crystals, Fluid: NONE SEEN
Eosinophils-Synovial: 0 % (ref 0–1)
Lymphocytes-Synovial Fld: 0 % (ref 0–20)
Monocyte-Macrophage-Synovial Fluid: 6 % — ABNORMAL LOW (ref 50–90)
Neutrophil, Synovial: 94 % — ABNORMAL HIGH (ref 0–25)
Other Cells-SYN: NONE SEEN
WBC, Synovial: 10640 /mm3 — ABNORMAL HIGH (ref 0–200)

## 2024-03-25 LAB — CBC
HCT: 48.5 % (ref 39.0–52.0)
Hemoglobin: 16.6 g/dL (ref 13.0–17.0)
MCH: 32.4 pg (ref 26.0–34.0)
MCHC: 34.2 g/dL (ref 30.0–36.0)
MCV: 94.7 fL (ref 80.0–100.0)
Platelets: 218 K/uL (ref 150–400)
RBC: 5.12 MIL/uL (ref 4.22–5.81)
RDW: 12.5 % (ref 11.5–15.5)
WBC: 7.7 K/uL (ref 4.0–10.5)
nRBC: 0 % (ref 0.0–0.2)

## 2024-03-25 LAB — BASIC METABOLIC PANEL WITH GFR
Anion gap: 18 — ABNORMAL HIGH (ref 5–15)
BUN: 13 mg/dL (ref 8–23)
CO2: 21 mmol/L — ABNORMAL LOW (ref 22–32)
Calcium: 9.8 mg/dL (ref 8.9–10.3)
Chloride: 89 mmol/L — ABNORMAL LOW (ref 98–111)
Creatinine, Ser: 1.13 mg/dL (ref 0.61–1.24)
GFR, Estimated: >60 mL/min (ref >60)
Glucose, Bld: 264 mg/dL — ABNORMAL HIGH (ref 70–99)
Potassium: 3.6 mmol/L (ref 3.5–5.1)
Sodium: 128 mmol/L — ABNORMAL LOW (ref 135–145)

## 2024-03-25 LAB — PROTIME-INR
INR: 1.1 (ref 0.8–1.2)
Prothrombin Time: 15.2 s (ref 11.4–15.2)

## 2024-03-25 LAB — CBG MONITORING, ED: Glucose-Capillary: 182 mg/dL — ABNORMAL HIGH (ref 70–99)

## 2024-03-25 LAB — D-DIMER, QUANTITATIVE: D-Dimer, Quant: 0.3 ug{FEU}/mL (ref 0.00–0.50)

## 2024-03-25 LAB — BRAIN NATRIURETIC PEPTIDE: B Natriuretic Peptide: 32 pg/mL (ref 0.0–100.0)

## 2024-03-25 MED ORDER — SODIUM CHLORIDE 0.9 % IV BOLUS
1000.0000 mL | Freq: Once | INTRAVENOUS | Status: AC
  Administered 2024-03-25: 1000 mL via INTRAVENOUS

## 2024-03-25 MED ORDER — LACTATED RINGERS IV BOLUS
1000.0000 mL | Freq: Once | INTRAVENOUS | Status: AC
  Administered 2024-03-25: 1000 mL via INTRAVENOUS

## 2024-03-25 MED ORDER — COLCHICINE 0.6 MG PO TABS: 0.6000 mg | ORAL_TABLET | Freq: Two times a day (BID) | ORAL | 0 refills | Status: AC

## 2024-03-25 MED ORDER — OXYCODONE-ACETAMINOPHEN 5-325 MG PO TABS
2.0000 | ORAL_TABLET | Freq: Once | ORAL | Status: AC
  Administered 2024-03-25: 2 via ORAL
  Filled 2024-03-25: qty 2

## 2024-03-25 MED ORDER — OXYCODONE-ACETAMINOPHEN 5-325 MG PO TABS: 1.0000 | ORAL_TABLET | Freq: Four times a day (QID) | ORAL | 0 refills | Status: AC | PRN

## 2024-03-25 MED ORDER — FENTANYL CITRATE PF 50 MCG/ML IJ SOSY
25.0000 ug | PREFILLED_SYRINGE | Freq: Once | INTRAMUSCULAR | Status: AC
  Administered 2024-03-25: 25 ug via INTRAVENOUS
  Filled 2024-03-25: qty 1

## 2024-03-25 MED ORDER — IOHEXOL 350 MG/ML SOLN
75.0000 mL | Freq: Once | INTRAVENOUS | Status: AC | PRN
  Administered 2024-03-25: 75 mL via INTRAVENOUS

## 2024-03-25 MED ORDER — COLCHICINE 0.6 MG PO TABS
1.2000 mg | ORAL_TABLET | Freq: Once | ORAL | Status: AC
  Administered 2024-03-25: 1.2 mg via ORAL
  Filled 2024-03-25: qty 2

## 2024-03-25 NOTE — ED Notes (Addendum)
 This RN reviewed with him his discharge instructions and he verbalized understanding of d/c instructions, prescriptions and follow up care.  Pt reports he drove himself here and is unable to find a ride home. He reports his daughter lives in [place], his fiance is in [place]  visiting family and his other son never answers the phone. The patient was instructed not to drive since he received 10mg  of Percocet at 2224 because it is a strong narcotic and can cause drowsiness and if he drives it would be considered driving under the influence. The patient verbalized understanding and stated he would wait in the hallway bed until it was safe for him to drive, which would be around 0230. The patient ambulated with walker to the hallway bed. When this nurse came out from another room the patient was not in the hallway bed and not found in the ER.

## 2024-03-25 NOTE — ED Provider Notes (Signed)
 [place] EMERGENCY DEPARTMENT AT [facility] Provider Note   CSN: [id number] Arrival date & time: 03/25/24  1144     Patient presents with: Shortness of Breath and Leg Pain   Gregory Andrews is a 63 y.o. male.  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 Presents to ER complaining of right leg pain from the medial thigh all the way to his ankle. States he having some pain of the right posterior knee  on July 7 and was seen here and had an ultrasound.  He had a nonocclusive thrombus in the right gastrocnemius vein at that time and was told to continue his Eliquis  and follow-up as an outpatient.  He is getting his Eliquis  but states his pain has gotten worse and today he feels short of breath.  Denies hemoptysis, denies nausea or vomiting.  He denies any chest pain.  No back pain.   Shortness of Breath Leg Pain      Prior to Admission medications   Medication Sig Start Date End Date Taking? Authorizing Provider  apixaban  (ELIQUIS ) 5 MG TABS tablet Take 1 tablet (5 mg total) by mouth 2 (two) times daily. PLEASE START ONLY AFTER YOU HAVE COMPLETED THE STARTER PACK 01/09/23   Verdene Purchase, MD  glimepiride  (AMARYL ) 2 MG tablet Take 2 mg by mouth daily with breakfast.    [provider]    Allergies: Codeine and Lisinopril     Review of Systems  Respiratory:  Positive for shortness of breath.     Updated Vital Signs BP (!) 132/103 (BP Location: Right Arm)   Pulse (!) 122   Temp 98.4 F (36.9 C) (Oral)   Resp 18   Ht 5' 10 (1.778 m)   Wt 81.6 kg   SpO2 100%   BMI 25.83 kg/m   Physical Exam Vitals and nursing note reviewed.  Constitutional:      General: He is not in acute distress.    Appearance: He is well-developed.  HENT:     Head: Normocephalic and atraumatic.  Eyes:     Extraocular Movements: Extraocular  movements intact.     Conjunctiva/sclera: Conjunctivae normal.     Pupils: Pupils are equal, round, and reactive to light.  Cardiovascular:     Rate and Rhythm: Regular rhythm. Tachycardia present.     Heart sounds: No murmur heard. Pulmonary:     Effort: Pulmonary effort is normal. No respiratory distress.     Breath sounds: Normal breath sounds.  Abdominal:     Palpations: Abdomen is soft.     Tenderness: There is no abdominal tenderness.  Musculoskeletal:        General: No swelling. Normal range of motion.     Cervical back: Neck supple.     Right lower leg: Tenderness present. No edema.     Left lower leg: No edema.  Skin:    General: Skin is warm and dry.     Capillary Refill: Capillary refill takes less than 2 seconds.  Neurological:     General: No focal deficit present.     Mental Status: He is alert and oriented to person, place, and time.  Psychiatric:        Mood and Affect: Mood normal.     (all labs ordered are listed, but only abnormal results are displayed) Labs Reviewed  BASIC METABOLIC PANEL WITH GFR  CBC  BRAIN NATRIURETIC PEPTIDE  PROTIME-INR  D-DIMER, QUANTITATIVE  TROPONIN I (HIGH SENSITIVITY)  TROPONIN I (HIGH SENSITIVITY)    EKG: None  Radiology: No results found.   .Joint Aspiration/Arthrocentesis  Date/Time: 03/25/2024 7:03 PM  Performed by: Suellen Sherran LABOR, PA-C Authorized by: Suellen Sherran LABOR, PA-C   Consent:    Consent obtained:  Written   Consent given by:  Patient   Risks discussed:  Incomplete drainage, bleeding, infection, pain and nerve damage   Alternatives discussed:  Referral Universal protocol:    Procedure explained and questions answered to patient or proxy's satisfaction: yes     Test results available: yes     Imaging studies available: yes     Immediately prior to procedure, a time out was called: yes     Patient identity confirmed:  Verbally with patient Location:    Location:  Knee   Knee:  R  knee Anesthesia:    Anesthesia method:  Local infiltration   Local anesthetic:  Lidocaine  1% w/o epi Procedure details:    Preparation: Patient was prepped and draped in usual sterile fashion     Needle gauge:  18 G   Ultrasound guidance: no     Approach:  Superior   Aspirate characteristics:  Yellow and cloudy   Steroid injected: no     Specimen collected: yes   Post-procedure details:    Dressing:  Adhesive bandage   Procedure completion:  Tolerated well, no immediate complications    Medications Ordered in the ED - No data to display                                  Medical Decision Making This patient presents to the ED for concern of severe right leg pain with shortness of breath, this involves an extensive number of treatment options, and is a complaint that carries with it a high risk of complications and morbidity.  The differential diagnosis includes DVT, PE, arterial occlusion, ACS, pneumonia, septic arthritis, gout, osteoarthritis, other   Co morbidities that complicate the patient evaluation :   DVT   Additional history obtained:  Additional history obtained from EMR External records from outside source obtained and reviewed including prior notes and labs, prior imaging-recent ED chart related to visit for DVT   Lab Tests:  I Ordered, and personally interpreted labs.  The pertinent results include: Troponin negative x 2, D-dimer negative, INR normal, BMP with hyponatremia 128, blood glucose 264, increased anion gap of 18 with CO2 of 21 CBC with no leukocytosis or anemia  Imaging Studies ordered:  I ordered imaging studies including CT angio chest which shows no PE I independently visualized and interpreted imaging within scope of identifying emergent findings  I agree with the radiologist interpretation .  Knee shows large effusion with degenerative changes I agree with radiology read  Cardiac Monitoring: / EKG:  The patient was maintained on a cardiac  monitor.  I personally viewed and interpreted the cardiac monitored which showed an underlying rhythm of: Sinus tachycardia   Problem List / ED Course / Critical interventions / Medication management  Right leg pain with shortness of breath-patient presents the ER tachycardic complaining of severe right leg pain from his thigh to his ankle.  He is worried about DVT as he has in the past, had ultrasound on July 7 showing nonocclusive DVT.  Has been compliant with his Eliquis  but had not  followed up yet.  He states he had some mild shortness of breath today with this as well but no chest pain. PE study negative, still tachycardic no leukocytosis or fever.  On reevaluation patient still has good pulses leg exam primarily reveals localized tenderness to the right knee with a large effusion.  X-ray was ordered and shows arthritis with large effusion.  Patient is on blood thinners we discussed this could be a hemarthrosis but given his degree of pain, we need arthrocentesis to rule out infection.  Patient understands risks of bleeding and infection and is agreeable with proceeding with this.  I performed the arthrocentesis and patient tolerated this well I obtained 40 cc of cloudy yellow fluid.  Has some relief of his pain with this procedure as well.  He is aware that he is waiting on results of the arthrocentesis.  Signed out to Dr. Cleotilde at this time pending results. I ordered medication including morphine   for pain  Reevaluation of the patient after these medicines showed that the patient improved I have reviewed the patients home medicines and have made adjustments as needed       Amount and/or Complexity of Data Reviewed Labs: ordered. Radiology: ordered.  Risk Prescription drug management.        Final diagnoses:  None    ED Discharge Orders     None          Suellen Sherran DELENA DEVONNA 03/25/24 TYRA Cleotilde Rogue, MD 03/25/24 2318

## 2024-03-25 NOTE — ED Notes (Signed)
 Requested EDP evaluate pt asap due to presentation.

## 2024-03-25 NOTE — Discharge Instructions (Addendum)
 Thankfully your x-rays did not show any abnormalities of the bones, fluid that was removed from your knee does not show any signs of infection, there is no bleeding and no gout.  I have prescribed a medicine for home for you including the following  Colchicine  twice a day for 4 days  Opiate medications such as Percocet or Vicodin or morphine  are very strong pain medications that are also very addictive if they are taken for too long, even a single dose can predispose someone to become addicted so be very careful when taking this medication.  You should take the smallest amount possible to relieve your pain, these medications may cause constipation or nausea, please follow-up with your doctor if you are having the need for ongoing pain control despite these medications.  Additionally please be aware that these medications may cause sedation or sleepiness or alter judgment so you should not take this if you are driving a vehicle or operating heavy machinery or taking care of small children.  You will need to follow-up with your doctor within the next 3 days for recheck, the pain in the knee usually goes down over a week or so but if it gets worse with fevers redness or any severe or worsening symptoms return to the ER.  I would recommend that you follow-up with an orthopedist and I have given you the phone number for Dr. Margrette here locally.  Your family doctor should be able to work through the residual small blood clot in your leg.  There is nothing else that you need to do differently about that tonight.

## 2024-03-25 NOTE — ED Notes (Signed)
 The patient is requesting for his right knee to be re-evaluated because it is not any better since the arthrocentesis. Dr. Cleotilde informed via secure epic chat.

## 2024-03-25 NOTE — ED Triage Notes (Addendum)
 Pt via POV c/o right lower leg pain and SOB since earlier this morning. Prior hx DVT and PE. Pt is tachycardic and diaphoretic in triage; moved to room to complete triage.

## 2024-03-27 LAB — GLUCOSE, BODY FLUID OTHER: Glucose, Body Fluid Other: 50 mg/dL

## 2024-03-27 LAB — PROTEIN, BODY FLUID (OTHER): Total Protein, Body Fluid Other: 4.8 g/dL

## 2024-03-29 LAB — BODY FLUID CULTURE W GRAM STAIN: Culture: NO GROWTH

## 2024-05-18 ENCOUNTER — Ambulatory Visit (INDEPENDENT_AMBULATORY_CARE_PROVIDER_SITE_OTHER): Admitting: Orthopedic Surgery

## 2024-05-18 VITALS — BP 134/82 | HR 99 | Ht 70.0 in | Wt 174.0 lb

## 2024-05-18 DIAGNOSIS — M1711 Unilateral primary osteoarthritis, right knee: Secondary | ICD-10-CM

## 2024-05-18 DIAGNOSIS — M25461 Effusion, right knee: Secondary | ICD-10-CM

## 2024-05-18 NOTE — Progress Notes (Signed)
 Patient ID: Gregory Andrews, male   DOB: 09-Sep-1961, 63 y.o.   MRN: [id number]  Chief Complaint  Patient presents with   Knee Pain    R constant stiffness and pain behind the knee. Pt had the knee drained in July, states swelling isn't as bad but still     Current Outpatient Medications  Medication Instructions   amLODipine (NORVASC) 5 mg, Oral, Daily   apixaban  (ELIQUIS ) 5 mg, Oral, 2 times daily, PLEASE START ONLY AFTER YOU HAVE COMPLETED THE STARTER PACK   colchicine  0.6 mg, Oral, 2 times daily   glimepiride  (AMARYL ) 2 mg, Oral, As needed   hydrochlorothiazide (HYDRODIURIL) 25 mg, Daily   HYDROcodone -acetaminophen  (NORCO/VICODIN) 5-325 MG tablet 1 tablet, Every 8 hours PRN   metFORMIN  (GLUCOPHAGE ) 500 mg, Oral, Daily   oxyCODONE -acetaminophen  (PERCOCET/ROXICET) 5-325 MG tablet 1 tablet, Oral, Every 6 hours PRN    Allergies  Allergen Reactions   Codeine Shortness Of Breath   Lisinopril  Swelling     63 year old male presents for evaluation of pain and swelling right knee  Although the knee hurts all over it also hurts in the back of the knee.  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    Review of Systems  Constitutional:  Negative for chills, fever and weight loss.  Respiratory:  Negative for shortness of breath.   Cardiovascular:  Negative for chest pain.  Neurological:  Negative for tingling.    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   On life-long Coumadin    Recurrent pulmonary embolism 01/21/2012   On life-long Coumadin    Shoulder separation    left    Past Surgical History:  Procedure Laterality Date   INGUINAL HERNIA REPAIR Left 05/26/2020   Procedure: LEFT INGUINAL HERNIORRHAPY WITH MESH;  Surgeon: Mavis Anes, MD;  Location: [facility];  Service: General;  Laterality: Left;   left kidney removed     right knee surgery     UMBILICAL HERNIA REPAIR  05/26/2020   Procedure: UMBILICAL HERNIORRHAPY WITH MESH;  Surgeon: Mavis Anes, MD;  Location: [facility];  Service: General;;    PHYSICAL EXAM  BP 134/82   Pulse 99   Ht 5' 10 (1.778 m)   Wt 174 lb (78.9 kg)   BMI 24.97 kg/m  GENERAL appearance reveals no gross abnormalities, normal development grooming and hygiene   MENTAL STATUS we note that the patient is awake alert and oriented to person place and time MOOD/AFFECT ARE NORMAL   GAIT reveals slight limp in the effected limb  Right Knee Exam   Muscle Strength  The patient has normal right knee strength.  Tenderness  The patient is experiencing tenderness in the medial joint line and lateral joint line (Popliteal fossa Achilles tendon separate pains however).  Range of Motion  Extension:  5  Flexion:  abnormal Right knee flexion: 115.  Tests  McMurray:  Medial - negative Lateral - negative  Varus: negative Valgus: negative Drawer:  Anterior - negative    Posterior - negative  Other  Erythema: absent Scars: absent Sensation: normal Pulse: present Swelling: none Effusion: effusion present   Left Knee Exam   Muscle Strength  The patient has normal left knee strength.  Tenderness  The patient is experiencing no tenderness.   Range of Motion  Extension:  normal  Flexion:  normal   Tests  McMurray:  Medial - negative Lateral - negative Varus: negative Valgus: negative Drawer:  Anterior - negative     Posterior - negative  Other  Erythema: absent Scars:  absent Sensation: normal Pulse: present Swelling: none     VASC 2+ dorsalis pedis pulse normal capillary refill excellent warmth to the extremity  NEURO normal sensation and no pathologic reflexes  LYMPH deferred noncontributory  MEDICAL DECISION MAKING  A.  Encounter Diagnoses  Name Primary?   Unilateral primary osteoarthritis, right knee Yes   Effusion, right knee     B. DATA ANALYSED:  IMAGING: Independent interpretation of images: X-ray shows osteoarthritis of the right knee with narrowing of the medial and lateral compartments but the main disease is in the lateral compartment minimal osteophytes are seen some subchondral sclerosis no significant cysts are noted  Orders: HA insurance approval  Outside records reviewed: ER  C. MANAGEMENT nonoperative aspiration injection  Follow-up 4 weeks consider HA injection  Patient poor surgical candidate based on history of multiple blood clots and pulmonary emboli   Procedure note injection and aspiration right knee joint  Verbal consent was obtained to aspirate and inject the right knee joint   Timeout was completed to confirm the site of aspiration and injection  An 18-gauge needle was used to aspirate the knee joint from a suprapatellar lateral approach.  The medications used were 40 mg of Depo-Medrol  and 1% lidocaine  3 cc  Anesthesia was provided by ethyl chloride and the skin was prepped with alcohol.  After cleaning the skin with alcohol an 18-gauge needle was used to aspirate the right knee joint.  We obtained 20  cc of fluid clear and yellow with cartilage particles  We follow this by injection of 40 mg of Depo-Medrol  and 3 cc 1% lidocaine .  There were no complications. A sterile bandage was applied.    No orders of the defined types were placed in this encounter.

## 2024-05-18 NOTE — Patient Instructions (Signed)

## 2024-05-18 NOTE — Progress Notes (Signed)
  Intake history:  Chief Complaint  Patient presents with   Knee Pain    R constant stiffness and pain behind the knee. Pt had the knee drained in July, states swelling isn't as bad but still      Ht 5' 10 (1.778 m)   Wt 174 lb (78.9 kg)   BMI 24.97 kg/m  Body mass index is 24.97 kg/m.    WHAT ARE WE SEEING YOU FOR TODAY?   right knee(s)  How long has this bothered you? (DOI?DOS?WS?)  approximately 2-3 month(s)   Was there an injury? No  Anticoag.  Yes  Diabetes Yes  Heart disease No  Hypertension Yes  SMOKING HX Yes  Kidney disease Yes Has one kidney  Any ALLERGIES ______________________________________________   Treatment:  Have you taken:  Tylenol  Yes  Advil No  Had PT No  Had injection No  Other  _________________________

## 2024-06-15 ENCOUNTER — Ambulatory Visit (INDEPENDENT_AMBULATORY_CARE_PROVIDER_SITE_OTHER): Admitting: Orthopedic Surgery

## 2024-06-15 DIAGNOSIS — M1711 Unilateral primary osteoarthritis, right knee: Secondary | ICD-10-CM

## 2024-06-15 MED ORDER — SODIUM HYALURONATE (VISCOSUP) 20 MG/2ML IX SOSY
20.0000 mg | PREFILLED_SYRINGE | INTRA_ARTICULAR | Status: AC
  Administered 2024-06-15 – 2024-07-06 (×3): 20 mg via INTRA_ARTICULAR

## 2024-06-15 NOTE — Progress Notes (Signed)
 Patient: Gregory Andrews           Date of Birth: May 06, 1961           MRN: [id number] Visit Date: 06/15/2024 Requested by: Leigh Lung, MD [address] [place],  [place] [address] PCP: Leigh Lung, MD  Chief Complaint  Patient presents with   Injections    R knee Euflexxa #1    Encounter Diagnosis  Name Primary?   Unilateral primary osteoarthritis, right knee Yes    The patient has consented to and requested hyaluronic acid injection   MEDICATION: euflexa # 1/3  Right knee:  THE knee is prepped with alcohol and ethyl chloride  The injection is performed with a 21-gauge needle, via inferolateral approach  No complications were noted  Appropriate instructions post injection were given   RTC X 1 WEEK

## 2024-06-15 NOTE — Progress Notes (Signed)
    06/15/2024   Chief Complaint  Patient presents with   Injections    R knee Euflexxa #1    Encounter Diagnosis  Name Primary?   Unilateral primary osteoarthritis, right knee Yes    What pharmacy do you use ? [facility]  Unchanged

## 2024-06-22 ENCOUNTER — Ambulatory Visit: Admitting: Orthopedic Surgery

## 2024-06-29 ENCOUNTER — Ambulatory Visit (INDEPENDENT_AMBULATORY_CARE_PROVIDER_SITE_OTHER): Admitting: Orthopedic Surgery

## 2024-06-29 ENCOUNTER — Encounter: Payer: Self-pay | Admitting: Orthopedic Surgery

## 2024-06-29 DIAGNOSIS — M1711 Unilateral primary osteoarthritis, right knee: Secondary | ICD-10-CM

## 2024-06-29 NOTE — Progress Notes (Signed)
 Chief Complaint  Patient presents with   Injections    Euflexxa 2 R knee    Encounter Diagnosis  Name Primary?   Unilateral primary osteoarthritis, right knee Yes    Patient: Gregory Andrews           Date of Birth: 10-14-1960           MRN: [id number] Visit Date: 06/29/2024 Requested by: Leigh Lung, MD [address] [place],  [place] [address] PCP: Leigh Lung, MD  Chief Complaint  Patient presents with   Injections    Euflexxa 2 R knee    Encounter Diagnosis  Name Primary?   Unilateral primary osteoarthritis, right knee Yes    The patient has consented to and requested hyaluronic acid injection   MEDICATION: Euflexxa No. 2 of 3  right  THE knee is prepped with alcohol and ethyl chloride  The injection is performed with a 21-gauge needle, via inferolateral approach  No complications were noted  Appropriate instructions post injection were given  Return in 1 week for third injection

## 2024-07-06 ENCOUNTER — Ambulatory Visit: Admitting: Orthopedic Surgery

## 2024-07-06 DIAGNOSIS — M1711 Unilateral primary osteoarthritis, right knee: Secondary | ICD-10-CM

## 2024-07-06 MED ORDER — SODIUM HYALURONATE (VISCOSUP) 20 MG/2ML IX SOSY: 20.0000 mg | PREFILLED_SYRINGE | Freq: Once | INTRA_ARTICULAR | Status: AC

## 2024-07-06 NOTE — Progress Notes (Signed)
 Patient: Gregory Andrews           Date of Birth: 29-Aug-1961           MRN: [id number] Visit Date: 07/06/2024 Requested by: Leigh Lung, MD [address] [place],  [place] [address] PCP: Leigh Lung, MD  Chief Complaint  Patient presents with   Injections    Right knee injection     No diagnosis found.  The patient has consented to and requested hyaluronic acid injection   MEDICATION: Euflexxa  right  THE knee is prepped with alcohol and ethyl chloride  The injection is performed with a 21-gauge needle, via inferolateral approach  No complications were noted  Appropriate instructions post injection were given  Return as needed May repeat in 6 months

## 2024-09-07 ENCOUNTER — Encounter (HOSPITAL_COMMUNITY): Payer: Self-pay

## 2024-09-07 ENCOUNTER — Emergency Department (HOSPITAL_COMMUNITY)

## 2024-09-07 ENCOUNTER — Emergency Department (HOSPITAL_COMMUNITY)
Admission: EM | Admit: 2024-09-07 | Discharge: 2024-09-07 | Disposition: A | Discharge status: Home / Self Care | Attending: Emergency Medicine | Admitting: Emergency Medicine

## 2024-09-07 ENCOUNTER — Other Ambulatory Visit: Payer: Self-pay

## 2024-09-07 DIAGNOSIS — R531 Weakness: Secondary | ICD-10-CM | POA: Diagnosis not present

## 2024-09-07 DIAGNOSIS — Z7901 Long term (current) use of anticoagulants: Secondary | ICD-10-CM | POA: Diagnosis not present

## 2024-09-07 DIAGNOSIS — Z79899 Other long term (current) drug therapy: Secondary | ICD-10-CM | POA: Diagnosis not present

## 2024-09-07 DIAGNOSIS — R000 Tachycardia, unspecified: Secondary | ICD-10-CM | POA: Insufficient documentation

## 2024-09-07 LAB — COMPREHENSIVE METABOLIC PANEL WITH GFR
ALT: 85 U/L — ABNORMAL HIGH (ref 0–44)
AST: 113 U/L — ABNORMAL HIGH (ref 15–41)
Albumin: 4.8 g/dL (ref 3.5–5.0)
Alkaline Phosphatase: 69 U/L (ref 38–126)
Anion gap: 27 — ABNORMAL HIGH (ref 5–15)
BUN: 13 mg/dL (ref 8–23)
CO2: 23 mmol/L (ref 22–32)
Calcium: 9.6 mg/dL (ref 8.9–10.3)
Chloride: 89 mmol/L — ABNORMAL LOW (ref 98–111)
Creatinine, Ser: 0.92 mg/dL (ref 0.61–1.24)
GFR, Estimated: >60 mL/min
Glucose, Bld: 138 mg/dL — ABNORMAL HIGH (ref 70–99)
Potassium: 3.8 mmol/L (ref 3.5–5.1)
Sodium: 139 mmol/L (ref 135–145)
Total Bilirubin: 0.9 mg/dL (ref 0.0–1.2)
Total Protein: 8.1 g/dL (ref 6.5–8.1)

## 2024-09-07 LAB — TROPONIN T, HIGH SENSITIVITY
Troponin T High Sensitivity: 24 ng/L — ABNORMAL HIGH (ref 0–19)
Troponin T High Sensitivity: 24 ng/L — ABNORMAL HIGH (ref 0–19)

## 2024-09-07 LAB — CBC
HCT: 41.7 % (ref 39.0–52.0)
Hemoglobin: 14.5 g/dL (ref 13.0–17.0)
MCH: 32.4 pg (ref 26.0–34.0)
MCHC: 34.8 g/dL (ref 30.0–36.0)
MCV: 93.3 fL (ref 80.0–100.0)
Platelets: 155 K/uL (ref 150–400)
RBC: 4.47 MIL/uL (ref 4.22–5.81)
RDW: 13.2 % (ref 11.5–15.5)
WBC: 5.5 K/uL (ref 4.0–10.5)
nRBC: 0 % (ref 0.0–0.2)

## 2024-09-07 LAB — CBG MONITORING, ED: Glucose-Capillary: 129 mg/dL — ABNORMAL HIGH (ref 70–99)

## 2024-09-07 LAB — RESP PANEL BY RT-PCR (RSV, FLU A&B, COVID)  RVPGX2
Influenza A by PCR: NEGATIVE
Influenza B by PCR: NEGATIVE
Resp Syncytial Virus by PCR: NEGATIVE
SARS Coronavirus 2 by RT PCR: NEGATIVE

## 2024-09-07 LAB — D-DIMER, QUANTITATIVE: D-Dimer, Quant: 0.48 ug{FEU}/mL (ref 0.00–0.50)

## 2024-09-07 MED ORDER — LACTATED RINGERS IV BOLUS
1000.0000 mL | Freq: Once | INTRAVENOUS | Status: AC
  Administered 2024-09-07: 1000 mL via INTRAVENOUS

## 2024-09-07 NOTE — ED Triage Notes (Signed)
 Pt arrives from home with reports of weakness that has been ongoing since waking up this morning around 9 am. Pt reports all over weakness.

## 2024-09-07 NOTE — ED Provider Notes (Signed)
" °  Physical Exam  BP 112/83 (BP Location: Right Arm)   Pulse (!) 118   Temp 97.9 F (36.6 C) (Oral)   Resp 20   Ht 5' 10 (1.778 m)   Wt 79.4 kg   SpO2 96%   BMI 25.11 kg/m   Physical Exam  Procedures  Procedures  ED Course / MDM    Medical Decision Making Amount and/or Complexity of Data Reviewed Labs: ordered. Radiology: ordered. ECG/medicine tests: ordered.   Patient signed out.  Workup reassuring.  Heart rate has come down.  Fatigue.  LFTs mildly elevated but follow-up with PCP.  Will discharge home.       Patsey Lot, MD 09/07/24 1954  "

## 2024-09-07 NOTE — ED Notes (Signed)
 Pt was given a urinal he is aware that a simple is needed.

## 2024-09-07 NOTE — ED Provider Notes (Signed)
 " [place] [facility] DEPARTMENT AT [facility] Provider Note   CSN: [id number] Arrival date & time: 09/07/24  1316     Patient presents with: Weakness   Gregory Andrews is a 63 y.o. male.   Patient c/o feeling generally weak since awakening this AM. Indicates yesterday felt fairly normal, but when got up today was weak all over. Denies any unilateral numbness or weakness. No change in speech or vision. No headache. Denies any chest pain or discomfort. No sob. +nasal congestion, runny nose. No sinus pain. No sore throat. No new or worsening cough. No known specific ill contacts or known covid or flu exposure. No abd pain or nvd. No dysuria or gu c/o. No extremity pain or swelling. No recent change in meds. No blood loss or rectal bleeding. No syncope. No trauma. Was able to drive self to ED.   The history is provided by the patient and medical records.  Weakness Associated symptoms: no abdominal pain, no chest pain, no cough, no diarrhea, no dysuria, no fever, no headaches, no shortness of breath and no vomiting        Prior to Admission medications  Medication Sig Start Date End Date Taking? Authorizing Provider  amLODipine (NORVASC) 5 MG tablet Take 5 mg by mouth daily. 03/19/24   [provider]  apixaban  (ELIQUIS ) 5 MG TABS tablet Take 1 tablet (5 mg total) by mouth 2 (two) times daily. PLEASE START ONLY AFTER YOU HAVE COMPLETED THE STARTER PACK 01/09/23   Verdene Purchase, MD  colchicine  0.6 MG tablet Take 1 tablet (0.6 mg total) by mouth 2 (two) times daily. 03/25/24   Cleotilde Rogue, MD  glimepiride  (AMARYL ) 2 MG tablet Take 2 mg by mouth as needed (high blood sugar).    [provider]  hydrochlorothiazide (HYDRODIURIL) 25 MG tablet Take 25 mg by mouth daily. 03/19/24   [provider]  HYDROcodone -acetaminophen  (NORCO/VICODIN) 5-325 MG tablet Take 1 tablet by mouth every 8 (eight) hours as needed for moderate pain (pain score 4-6). Patient not  taking: Reported on 05/18/2024 03/22/24   [provider]  metFORMIN  (GLUCOPHAGE ) 500 MG tablet Take 500 mg by mouth daily. 03/19/24   [provider]  oxyCODONE -acetaminophen  (PERCOCET/ROXICET) 5-325 MG tablet Take 1 tablet by mouth every 6 (six) hours as needed for severe pain (pain score 7-10). Patient not taking: Reported on 05/18/2024 03/25/24   Cleotilde Rogue, MD    Allergies: Codeine and Lisinopril     Review of Systems  Constitutional:  Negative for chills and fever.  HENT:  Positive for congestion and rhinorrhea. Negative for sore throat.   Eyes:  Negative for pain, redness and visual disturbance.  Respiratory:  Negative for cough and shortness of breath.   Cardiovascular:  Negative for chest pain, palpitations and leg swelling.  Gastrointestinal:  Negative for abdominal pain, blood in stool, diarrhea and vomiting.  Genitourinary:  Negative for dysuria and flank pain.  Musculoskeletal:  Negative for back pain and neck pain.  Skin:  Negative for rash.  Neurological:  Positive for weakness. Negative for syncope, speech difficulty, numbness and headaches.    Updated Vital Signs BP 112/83 (BP Location: Right Arm)   Pulse (!) 118   Temp 97.9 F (36.6 C) (Oral)   Resp 20   Ht 1.778 m (5' 10)   Wt 79.4 kg   SpO2 96%   BMI 25.11 kg/m   Physical Exam Vitals and nursing note reviewed.  Constitutional:      Appearance:  Normal appearance. He is well-developed.  HENT:     Head: Atraumatic.     Comments: No sinus or temporal tenderness.     Nose: Nose normal.     Mouth/Throat:     Mouth: Mucous membranes are moist.     Pharynx: Oropharynx is clear.  Eyes:     General: No scleral icterus.    Conjunctiva/sclera: Conjunctivae normal.     Pupils: Pupils are equal, round, and reactive to light.  Neck:     Vascular: No carotid bruit.     Trachea: No tracheal deviation.     Comments: Trachea midline, thyroid not grossly enlarged or tender. No neck stiffness or  rigidity.  Cardiovascular:     Rate and Rhythm: Normal rate and regular rhythm.     Pulses: Normal pulses.     Heart sounds: Normal heart sounds. No murmur heard.    No friction rub. No gallop.  Pulmonary:     Effort: Pulmonary effort is normal. No accessory muscle usage or respiratory distress.     Breath sounds: Normal breath sounds.  Abdominal:     General: Bowel sounds are normal. There is no distension.     Palpations: Abdomen is soft. There is no mass.     Tenderness: There is no abdominal tenderness. There is no guarding.  Genitourinary:    Comments: No cva tenderness. Musculoskeletal:        General: No swelling or tenderness.     Cervical back: Normal range of motion and neck supple. No rigidity.     Right lower leg: No edema.     Left lower leg: No edema.  Skin:    General: Skin is warm and dry.     Findings: No rash.  Neurological:     Mental Status: He is alert.     Comments: Alert, speech clear. Motor/sens grossly intact bil. No pronator drift. Stre 5/5 bil. Sens grossly intact. Steady gait.   Psychiatric:        Mood and Affect: Mood normal.     (all labs ordered are listed, but only abnormal results are displayed) Results for orders placed or performed during the hospital encounter of 09/07/24  Resp panel by RT-PCR (RSV, Flu A&B, Covid) Anterior Nasal Swab   Collection Time: 09/07/24  1:41 PM   Specimen: Anterior Nasal Swab  Result Value Ref Range   SARS Coronavirus 2 by RT PCR NEGATIVE NEGATIVE   Influenza A by PCR NEGATIVE NEGATIVE   Influenza B by PCR NEGATIVE NEGATIVE   Resp Syncytial Virus by PCR NEGATIVE NEGATIVE  CBG monitoring, ED   Collection Time: 09/07/24  1:44 PM  Result Value Ref Range   Glucose-Capillary 129 (H) 70 - 99 mg/dL  CBC   Collection Time: 09/07/24  3:40 PM  Result Value Ref Range   WBC 5.5 4.0 - 10.5 K/uL   RBC 4.47 4.22 - 5.81 MIL/uL   Hemoglobin 14.5 13.0 - 17.0 g/dL   HCT 58.2 60.9 - 47.9 %   MCV 93.3 80.0 - 100.0 fL    MCH 32.4 26.0 - 34.0 pg   MCHC 34.8 30.0 - 36.0 g/dL   RDW 86.7 88.4 - 84.4 %   Platelets 155 150 - 400 K/uL   nRBC 0.0 0.0 - 0.2 %  D-dimer, quantitative   Collection Time: 09/07/24  3:40 PM  Result Value Ref Range   D-Dimer, Quant 0.48 0.00 - 0.50 ug/mL-FEU      EKG: EKG Interpretation Date/Time:  Friday  September 07 2024 14:49:22 EST Ventricular Rate:  107 PR Interval:  128 QRS Duration:  86 QT Interval:  398 QTC Calculation: 531 R Axis:   88  Text Interpretation: Sinus tachycardia Nonspecific ST and T wave abnormality Prolonged QT Confirmed by Bernard Drivers ([id number]) on 09/07/2024 2:58:37 PM  Radiology: DG Chest 2 View Result Date: 09/07/2024 CLINICAL DATA:  Weakness EXAM: CHEST - 2 VIEW COMPARISON:  March 25, 2024 FINDINGS: The heart size and mediastinal contours are within normal limits. Right lung is clear. Elevated left hemidiaphragm is noted with minimal left basilar subsegmental atelectasis. The visualized skeletal structures are unremarkable. IMPRESSION: Elevated left hemidiaphragm with minimal left basilar subsegmental atelectasis. Electronically Signed   By: Lynwood Landy Raddle M.D.   On: 09/07/2024 14:45     Procedures   Medications Ordered in the ED - No data to display                                  Medical Decision Making Problems Addressed: Generalized weakness: acute illness or injury with systemic symptoms that poses a threat to life or bodily functions Sinus tachycardia: acute illness or injury  Amount and/or Complexity of Data Reviewed Labs: ordered. Decision-making details documented in ED Course. Radiology: ordered and independent interpretation performed. Decision-making details documented in ED Course. ECG/medicine tests: ordered and independent interpretation performed. Decision-making details documented in ED Course.  Risk Decision regarding hospitalization.   Iv ns. Continuous pulse ox and cardiac monitoring. Labs ordered/sent. Imaging  ordered.   Differential diagnosis includes weakness, anemia, dehydration, viral syndrome, etc. . Dispo decision including potential need for admission considered - will get labs and imaging and reassess.   Reviewed nursing notes and prior charts for additional history. External reports reviewed. Additional history from:  Cardiac monitor: sinus rhythm, rate 110.  Labs reviewed/interpreted by me - covid and flu neg. Wbc and hgb normal. Ddimer wnl. Chem pending. Ua pending.   Xrays reviewed/interpreted by me - no pna.   1615, labs pending - signed out to Dr Patsey to check pending labs, recheck pt, and dispo appropriately.       Final diagnoses:  None    ED Discharge Orders     None          Bernard Drivers, MD 09/07/24 1619  "

## 2024-09-07 NOTE — Discharge Instructions (Addendum)
 It was our pleasure to provide your ER care today - we hope that you feel better.  Drink plenty of fluids/stay well hydrated.   Follow up closely with primary care doctor this coming week.  Return to ER if worse, new symptoms, fevers, new/severe pain, chest pain, trouble breathing, weak/fainting, one-sided numbness/weakness, change in speech or vision, or other concern.

## 2024-09-07 NOTE — ED Notes (Signed)
 Pt is AOX4 for this RN during head to toe assessment.

## 2024-09-07 NOTE — ED Notes (Signed)
 Pt struggling to sign MSE pad. Pt seems disoriented. EDP notified during triage.
